# Patient Record
Sex: Male | Born: 1990 | Race: White | Hispanic: No | Marital: Single | Smoking: Current every day smoker
Health system: Southern US, Community
[De-identification: ages and names within clinical notes are randomized; demographics above are authoritative.]

## PROBLEM LIST (undated history)

## (undated) DIAGNOSIS — F209 Schizophrenia, unspecified: Secondary | ICD-10-CM

## (undated) DIAGNOSIS — I10 Essential (primary) hypertension: Secondary | ICD-10-CM

## (undated) DIAGNOSIS — E7800 Pure hypercholesterolemia, unspecified: Secondary | ICD-10-CM

## (undated) DIAGNOSIS — F319 Bipolar disorder, unspecified: Secondary | ICD-10-CM

## (undated) DIAGNOSIS — E039 Hypothyroidism, unspecified: Secondary | ICD-10-CM

## (undated) DIAGNOSIS — F32A Depression, unspecified: Secondary | ICD-10-CM

## (undated) DIAGNOSIS — F329 Major depressive disorder, single episode, unspecified: Secondary | ICD-10-CM

## (undated) HISTORY — PX: TONSILLECTOMY: SUR1361

## (undated) HISTORY — PX: CARDIAC SURGERY: SHX584

---
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
## 2012-02-18 ENCOUNTER — Inpatient Hospital Stay: Payer: Self-pay | Admitting: Psychiatry

## 2012-02-18 LAB — COMPREHENSIVE METABOLIC PANEL
Albumin: 3.9 g/dL (ref 3.8–5.6)
Anion Gap: 7 (ref 7–16)
Co2: 29 mmol/L (ref 21–32)
Creatinine: 0.73 mg/dL (ref 0.60–1.30)
EGFR (Non-African Amer.): >60
Glucose: 98 mg/dL (ref 65–99)
Osmolality: 280 (ref 275–301)
SGOT(AST): 30 U/L (ref 10–41)
SGPT (ALT): 57 U/L
Total Protein: 7.3 g/dL (ref 6.4–8.6)

## 2012-02-18 LAB — CBC
HCT: 47.6 % (ref 40.0–52.0)
MCH: 30.7 pg (ref 26.0–34.0)
MCV: 91 fL (ref 80–100)
RBC: 5.23 10*6/uL (ref 4.40–5.90)
RDW: 13.6 % (ref 11.5–14.5)
WBC: 11.1 10*3/uL — ABNORMAL HIGH (ref 3.8–10.6)

## 2012-02-18 LAB — ETHANOL
Ethanol %: <0.003 % (ref 0.000–0.080)
Ethanol: <3 mg/dL

## 2012-02-18 LAB — DRUG SCREEN, URINE
Amphetamines, Ur Screen: NEGATIVE (ref ?–1000)
Barbiturates, Ur Screen: NEGATIVE (ref ?–200)
Benzodiazepine, Ur Scrn: NEGATIVE (ref ?–200)
Cannabinoid 50 Ng, Ur ~~LOC~~: NEGATIVE (ref ?–50)
Methadone, Ur Screen: NEGATIVE (ref ?–300)
Opiate, Ur Screen: NEGATIVE (ref ?–300)
Phencyclidine (PCP) Ur S: NEGATIVE (ref ?–25)
Tricyclic, Ur Screen: NEGATIVE (ref ?–1000)

## 2012-02-18 LAB — URINALYSIS, COMPLETE
Bacteria: NONE SEEN
Glucose,UR: NEGATIVE mg/dL (ref 0–75)
Ketone: NEGATIVE
Leukocyte Esterase: NEGATIVE
Ph: 7 (ref 4.5–8.0)
Protein: NEGATIVE
RBC,UR: NONE SEEN /HPF (ref 0–5)
Specific Gravity: 1.006 (ref 1.003–1.030)
WBC UR: <1 /HPF (ref 0–5)

## 2012-02-18 LAB — TSH: Thyroid Stimulating Horm: 2.42 u[IU]/mL

## 2012-02-28 LAB — VALPROIC ACID LEVEL: Valproic Acid: 73 ug/mL

## 2012-05-28 ENCOUNTER — Emergency Department: Payer: Self-pay | Admitting: Emergency Medicine

## 2012-05-28 LAB — COMPREHENSIVE METABOLIC PANEL
Alkaline Phosphatase: 96 U/L — ABNORMAL LOW (ref 98–317)
Bilirubin,Total: 0.2 mg/dL (ref 0.2–1.0)
Calcium, Total: 9.1 mg/dL (ref 9.0–10.7)
Chloride: 105 mmol/L (ref 98–107)
Co2: 27 mmol/L (ref 21–32)
Creatinine: 0.7 mg/dL (ref 0.60–1.30)
EGFR (African American): >60
EGFR (Non-African Amer.): >60
Osmolality: 281 (ref 275–301)
Potassium: 3.8 mmol/L (ref 3.5–5.1)
Sodium: 141 mmol/L (ref 136–145)
Total Protein: 7.2 g/dL (ref 6.4–8.6)

## 2012-05-28 LAB — CBC
HCT: 46.6 % (ref 40.0–52.0)
HGB: 16.5 g/dL (ref 13.0–18.0)
MCH: 32.4 pg (ref 26.0–34.0)
MCHC: 35.3 g/dL (ref 32.0–36.0)
MCV: 92 fL (ref 80–100)
RBC: 5.09 10*6/uL (ref 4.40–5.90)
WBC: 11.5 10*3/uL — ABNORMAL HIGH (ref 3.8–10.6)

## 2012-05-28 LAB — MAGNESIUM: Magnesium: 1.9 mg/dL

## 2012-09-17 ENCOUNTER — Emergency Department: Payer: Self-pay | Admitting: Unknown Physician Specialty

## 2012-09-17 LAB — COMPREHENSIVE METABOLIC PANEL
Albumin: 3.6 g/dL — ABNORMAL LOW (ref 3.8–5.6)
Anion Gap: 9 (ref 7–16)
Calcium, Total: 8.7 mg/dL — ABNORMAL LOW (ref 9.0–10.7)
Chloride: 108 mmol/L — ABNORMAL HIGH (ref 98–107)
Creatinine: 0.91 mg/dL (ref 0.60–1.30)
EGFR (African American): >60
Glucose: 126 mg/dL — ABNORMAL HIGH (ref 65–99)
Osmolality: 282 (ref 275–301)
Potassium: 4 mmol/L (ref 3.5–5.1)
SGOT(AST): 29 U/L (ref 10–41)
SGPT (ALT): 52 U/L (ref 12–78)
Sodium: 141 mmol/L (ref 136–145)
Total Protein: 7 g/dL (ref 6.4–8.6)

## 2012-09-17 LAB — CBC
HCT: 44.6 % (ref 40.0–52.0)
HGB: 15.6 g/dL (ref 13.0–18.0)
MCH: 32.1 pg (ref 26.0–34.0)
MCHC: 34.9 g/dL (ref 32.0–36.0)
MCV: 92 fL (ref 80–100)
Platelet: 172 10*3/uL (ref 150–440)
RBC: 4.85 10*6/uL (ref 4.40–5.90)
RDW: 12.9 % (ref 11.5–14.5)
WBC: 9.2 10*3/uL (ref 3.8–10.6)

## 2012-09-18 LAB — URINALYSIS, COMPLETE
Bacteria: NONE SEEN
Bilirubin,UR: NEGATIVE
Blood: NEGATIVE
Glucose,UR: NEGATIVE mg/dL (ref 0–75)
Leukocyte Esterase: NEGATIVE
Protein: NEGATIVE
RBC,UR: NONE SEEN /HPF (ref 0–5)
Specific Gravity: 1.019 (ref 1.003–1.030)
Squamous Epithelial: NONE SEEN
WBC UR: <1 /HPF (ref 0–5)

## 2012-12-28 ENCOUNTER — Emergency Department (HOSPITAL_COMMUNITY)
Admission: EM | Admit: 2012-12-28 | Discharge: 2012-12-29 | Disposition: A | Discharge status: Other Acute Inpatient Hospital | Payer: Federal, State, Local not specified - PPO | Attending: Emergency Medicine | Admitting: Emergency Medicine

## 2012-12-28 ENCOUNTER — Encounter (HOSPITAL_COMMUNITY): Payer: Self-pay | Admitting: *Deleted

## 2012-12-28 DIAGNOSIS — Z79899 Other long term (current) drug therapy: Secondary | ICD-10-CM | POA: Insufficient documentation

## 2012-12-28 DIAGNOSIS — R440 Auditory hallucinations: Secondary | ICD-10-CM

## 2012-12-28 DIAGNOSIS — Z862 Personal history of diseases of the blood and blood-forming organs and certain disorders involving the immune mechanism: Secondary | ICD-10-CM | POA: Insufficient documentation

## 2012-12-28 DIAGNOSIS — F209 Schizophrenia, unspecified: Secondary | ICD-10-CM | POA: Insufficient documentation

## 2012-12-28 DIAGNOSIS — Z8639 Personal history of other endocrine, nutritional and metabolic disease: Secondary | ICD-10-CM | POA: Insufficient documentation

## 2012-12-28 DIAGNOSIS — Z7982 Long term (current) use of aspirin: Secondary | ICD-10-CM | POA: Insufficient documentation

## 2012-12-28 DIAGNOSIS — I10 Essential (primary) hypertension: Secondary | ICD-10-CM | POA: Insufficient documentation

## 2012-12-28 DIAGNOSIS — Z8679 Personal history of other diseases of the circulatory system: Secondary | ICD-10-CM | POA: Insufficient documentation

## 2012-12-28 HISTORY — DX: Essential (primary) hypertension: I10

## 2012-12-28 HISTORY — DX: Schizophrenia, unspecified: F20.9

## 2012-12-28 LAB — CBC WITH DIFFERENTIAL/PLATELET
Basophils Absolute: 0 10*3/uL (ref 0.0–0.1)
Eosinophils Absolute: 0.3 10*3/uL (ref 0.0–0.7)
Eosinophils Relative: 2 % (ref 0–5)
HCT: 49.8 % (ref 39.0–52.0)
Lymphocytes Relative: 13 % (ref 12–46)
MCH: 32.1 pg (ref 26.0–34.0)
MCHC: 35.3 g/dL (ref 30.0–36.0)
MCV: 90.7 fL (ref 78.0–100.0)
Monocytes Absolute: 1 10*3/uL (ref 0.1–1.0)
RDW: 12.8 % (ref 11.5–15.5)
WBC: 15.7 10*3/uL — ABNORMAL HIGH (ref 4.0–10.5)

## 2012-12-28 LAB — URINALYSIS, ROUTINE W REFLEX MICROSCOPIC
Glucose, UA: NEGATIVE mg/dL
Leukocytes, UA: NEGATIVE
Nitrite: NEGATIVE
Specific Gravity, Urine: >1.03 — ABNORMAL HIGH (ref 1.005–1.030)
pH: 5.5 (ref 5.0–8.0)

## 2012-12-28 LAB — RAPID URINE DRUG SCREEN, HOSP PERFORMED
Benzodiazepines: POSITIVE — AB
Cocaine: NOT DETECTED
Opiates: NOT DETECTED

## 2012-12-28 LAB — BASIC METABOLIC PANEL
Calcium: 10.2 mg/dL (ref 8.4–10.5)
Creatinine, Ser: 1.04 mg/dL (ref 0.50–1.35)
GFR calc non Af Amer: >90 mL/min (ref >90)
Glucose, Bld: 96 mg/dL (ref 70–99)
Sodium: 137 mEq/L (ref 135–145)

## 2012-12-28 LAB — ETHANOL: Alcohol, Ethyl (B): <11 mg/dL (ref 0–11)

## 2012-12-28 LAB — URINE MICROSCOPIC-ADD ON

## 2012-12-28 MED ORDER — LORAZEPAM 1 MG PO TABS
1.0000 mg | ORAL_TABLET | Freq: Once | ORAL | Status: AC
  Administered 2012-12-28: 1 mg via ORAL

## 2012-12-28 MED ORDER — LORAZEPAM 1 MG PO TABS
ORAL_TABLET | ORAL | Status: AC
  Filled 2012-12-28: qty 1

## 2012-12-28 NOTE — ED Provider Notes (Signed)
History     CSN: [id number]  Arrival date & time 12/28/12  1719   First MD Initiated Contact with Patient 12/28/12 1753      Chief Complaint  Patient presents with  . V70.1     HPI Pt was seen at 1810.   Per pt, c/o gradual onset and persistence of constant auditory hallucinations for the past several weeks, worse over the past several days.  Pt states the voices "sometimes laugh" and "sometimes tell me to hurt myself."  Currently denies SI, no HI.  Endorses he has been taking his meds as prescribed but does not currently have an outpatient mental health provider.      Past Medical History  Diagnosis Date  . Schizophrenia   . Hypertension   . High cholesterol   . Atrial fibrillation     History reviewed. No pertinent past surgical history.    History  Substance Use Topics  . Smoking status: Never Smoker   . Smokeless tobacco: Not on file  . Alcohol Use: No    Review of Systems ROS: Statement: All systems negative except as marked or noted in the HPI; Constitutional: Negative for fever and chills. ; ; Eyes: Negative for eye pain, redness and discharge. ; ; ENMT: Negative for ear pain, hoarseness, nasal congestion, sinus pressure and sore throat. ; ; Cardiovascular: Negative for chest pain, palpitations, diaphoresis, dyspnea and peripheral edema. ; ; Respiratory: Negative for cough, wheezing and stridor. ; ; Gastrointestinal: Negative for nausea, vomiting, diarrhea, abdominal pain, blood in stool, hematemesis, jaundice and rectal bleeding. . ; ; Genitourinary: Negative for dysuria, flank pain and hematuria. ; ; Musculoskeletal: Negative for back pain and neck pain. Negative for swelling and trauma.; ; Skin: Negative for pruritus, rash, abrasions, blisters, bruising and skin lesion.; ; Neuro: Negative for headache, lightheadedness and neck stiffness. Negative for weakness, altered level of consciousness , altered mental status, extremity weakness, paresthesias, involuntary  movement, seizure and syncope.; Psych:  No SI, no SA, no HI, +auditory hallucinations.       Allergies  Review of patient's allergies indicates no known allergies.  Home Medications   Current Outpatient Rx  Name  Route  Sig  Dispense  Refill  . aspirin EC 81 MG tablet   Oral   Take 81 mg by mouth daily.         . divalproex (DEPAKOTE) 250 MG DR tablet   Oral   Take 250 mg by mouth 2 (two) times daily.         Marland Kitchen FLUoxetine (PROZAC) 20 MG tablet   Oral   Take 20 mg by mouth daily.         . risperiDONE (RISPERDAL) 0.5 MG tablet   Oral   Take 0.5 mg by mouth 2 (two) times daily.           BP 148/99  Pulse 98  Temp(Src) 98.4 F (36.9 C) (Oral)  Resp 20  Ht 5\' 10"  (1.778 m)  Wt 283 lb (128.368 kg)  BMI 40.61 kg/m2  SpO2 99%  Physical Exam 1815: Physical examination:  Nursing notes reviewed; Vital signs and O2 SAT reviewed;  Constitutional: Well developed, Well nourished, Well hydrated, In no acute distress; Head:  Normocephalic, atraumatic; Eyes: EOMI, PERRL, No scleral icterus; ENMT: Mouth and pharynx normal, Mucous membranes moist; Neck: Supple, Full range of motion, No lymphadenopathy; Cardiovascular: Regular rate and rhythm, No murmur, rub, or gallop; Respiratory: Breath sounds clear & equal bilaterally, No rales, rhonchi, wheezes.  Speaking full sentences with ease, Normal respiratory effort/excursion; Chest: Nontender, Movement normal; Abdomen: Soft, Nontender, Nondistended, Normal bowel sounds; Genitourinary: No CVA tenderness; Extremities: Pulses normal, No tenderness, No edema, No calf edema or asymmetry.; Neuro: AA&Ox3, Major CN grossly intact.  Speech clear. No gross focal motor or sensory deficits in extremities.; Skin: Color normal, Warm, Dry.; Psych:  Affect flat, poor eye contact.    ED Course  Procedures    MDM  MDM Reviewed: previous chart, nursing note and vitals Interpretation: labs   Results for orders placed during the hospital  encounter of 12/28/12  URINALYSIS, ROUTINE W REFLEX MICROSCOPIC      Result Value Range   Color, Urine AMBER (*) YELLOW   APPearance CLEAR  CLEAR   Specific Gravity, Urine >1.030 (*) 1.005 - 1.030   pH 5.5  5.0 - 8.0   Glucose, UA NEGATIVE  NEGATIVE mg/dL   Hgb urine dipstick NEGATIVE  NEGATIVE   Bilirubin Urine SMALL (*) NEGATIVE   Ketones, ur NEGATIVE  NEGATIVE mg/dL   Protein, ur TRACE (*) NEGATIVE mg/dL   Urobilinogen, UA 0.2  0.0 - 1.0 mg/dL   Nitrite NEGATIVE  NEGATIVE   Leukocytes, UA NEGATIVE  NEGATIVE  URINE RAPID DRUG SCREEN (HOSP PERFORMED)      Result Value Range   Opiates NONE DETECTED  NONE DETECTED   Cocaine NONE DETECTED  NONE DETECTED   Benzodiazepines POSITIVE (*) NONE DETECTED   Amphetamines NONE DETECTED  NONE DETECTED   Tetrahydrocannabinol POSITIVE (*) NONE DETECTED   Barbiturates NONE DETECTED  NONE DETECTED  CBC WITH DIFFERENTIAL      Result Value Range   WBC 15.7 (*) 4.0 - 10.5 K/uL   RBC 5.49  4.22 - 5.81 MIL/uL   Hemoglobin 17.6 (*) 13.0 - 17.0 g/dL   HCT 16.1  09.6 - 04.5 %   MCV 90.7  78.0 - 100.0 fL   MCH 32.1  26.0 - 34.0 pg   MCHC 35.3  30.0 - 36.0 g/dL   RDW 40.9  81.1 - 91.4 %   Platelets 190  150 - 400 K/uL   Neutrophils Relative 79 (*) 43 - 77 %   Neutro Abs 12.3 (*) 1.7 - 7.7 K/uL   Lymphocytes Relative 13  12 - 46 %   Lymphs Abs 2.0  0.7 - 4.0 K/uL   Monocytes Relative 7  3 - 12 %   Monocytes Absolute 1.0  0.1 - 1.0 K/uL   Eosinophils Relative 2  0 - 5 %   Eosinophils Absolute 0.3  0.0 - 0.7 K/uL   Basophils Relative 0  0 - 1 %   Basophils Absolute 0.0  0.0 - 0.1 K/uL  BASIC METABOLIC PANEL      Result Value Range   Sodium 137  135 - 145 mEq/L   Potassium 4.0  3.5 - 5.1 mEq/L   Chloride 100  96 - 112 mEq/L   CO2 25  19 - 32 mEq/L   Glucose, Bld 96  70 - 99 mg/dL   BUN 14  6 - 23 mg/dL   Creatinine, Ser 7.82  0.50 - 1.35 mg/dL   Calcium 95.6  8.4 - 21.3 mg/dL   GFR calc non Af Amer >90  >90 mL/min   GFR calc Af Amer >90  >90  mL/min  ETHANOL      Result Value Range   Alcohol, Ethyl (B) <11  0 - 11 mg/dL  URINE MICROSCOPIC-ADD ON      Result  Value Range   WBC, UA 0-2  <3 WBC/hpf   RBC / HPF 0-2  <3 RBC/hpf   Bacteria, UA RARE  RARE   Urine-Other MUCOUS PRESENT       2355:  ACT has eval: IVC paperwork completed due to pt making his head on the wall stating he first "wanted to go home" the wanted to "get tased."  Pt handcuffed to bed for pt and staff safety. Pt accepted to [facility], will transfer stable.           Laray Anger, DO 12/29/12 0011
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
## 2012-12-28 NOTE — BH Assessment (Signed)
[facility] Assessment Progress Note      12/28/2012 Patient was accepted by Elmon Kirschner, PA, to Dr. Jannifer Franklin; room 400-bed 1. Patient has been placed under IVC; he was wanting to leave after his father came in to the room to see him. He has been calm, but it was felt that IVC would be most appropriate. UR sheet completed; could not pre-cert insurance, must call on Monday.  Val Farnam H. Jacqulyn Ducking, [name], [name], LCASA, CSW-G

## 2012-12-28 NOTE — ED Notes (Signed)
Patient reports hearing voices that are telling him to kill himself. Patient reports hearing voices everyday that "come and go". Patient states he is not having thoughts of hurting himself or other, though the voices tell him to hurt himself. Patient with poor focus, unable to maintain eye contact more than a few seconds. Patient cooperative. Belongings secured. Meal given.

## 2012-12-28 NOTE — ED Notes (Signed)
Reports hearing voices - hx of schizophrenia; states is med compliant; denies SI/HI, but states voices are telling him to hurt himself.
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
## 2012-12-28 NOTE — ED Notes (Signed)
Pt steadily banging head against the wall. I asked pt to stop and he would not. He said, "I want to go home." I told pt beating his head on the wall wasn't helping his case he then turned around to me and said "I don't care, I want to get tased." Richard with security asked pt to stop as well he turned around and bucked up on him saying, "What are you gonna do, tase me."I want to be tased." [employer] called but the had no one available Richard got pt back in the bed and the tech then said pt was hearing things. Pt now is refusing EKG.

## 2012-12-28 NOTE — BHH Counselor (Signed)
Shon Baton, ACT counselor at [facility], submitted Pt for admission to [facility]. Laverle Hobby, [facility] confirmed bed availability. Jorje Guild, PA reviewed clinical information and accepted Pt to the service of Dr. Cyndia Diver, room 400-1.  Harlin Rain Patsy Baltimore, LPC, [facility] Assessment Counselor

## 2012-12-29 ENCOUNTER — Inpatient Hospital Stay (HOSPITAL_COMMUNITY)
Admission: AD | Admit: 2012-12-29 | Discharge: 2013-01-04 | DRG: 430 | Disposition: A | Discharge status: Home / Self Care | Payer: Federal, State, Local not specified - PPO | Source: Intra-hospital | Attending: Psychiatry | Admitting: Psychiatry

## 2012-12-29 ENCOUNTER — Encounter (HOSPITAL_COMMUNITY): Payer: Self-pay

## 2012-12-29 DIAGNOSIS — I471 Supraventricular tachycardia, unspecified: Secondary | ICD-10-CM | POA: Diagnosis present

## 2012-12-29 DIAGNOSIS — Z79899 Other long term (current) drug therapy: Secondary | ICD-10-CM

## 2012-12-29 DIAGNOSIS — F250 Schizoaffective disorder, bipolar type: Secondary | ICD-10-CM | POA: Diagnosis present

## 2012-12-29 DIAGNOSIS — I10 Essential (primary) hypertension: Secondary | ICD-10-CM | POA: Diagnosis present

## 2012-12-29 DIAGNOSIS — I4891 Unspecified atrial fibrillation: Secondary | ICD-10-CM | POA: Diagnosis present

## 2012-12-29 DIAGNOSIS — F209 Schizophrenia, unspecified: Principal | ICD-10-CM | POA: Diagnosis present

## 2012-12-29 DIAGNOSIS — F1210 Cannabis abuse, uncomplicated: Secondary | ICD-10-CM | POA: Diagnosis present

## 2012-12-29 MED ORDER — ASPIRIN 81 MG PO CHEW
81.0000 mg | CHEWABLE_TABLET | Freq: Once | ORAL | Status: AC
  Administered 2012-12-29: 81 mg via ORAL
  Filled 2012-12-29 (×2): qty 1

## 2012-12-29 MED ORDER — DIVALPROEX SODIUM 250 MG PO DR TAB
250.0000 mg | DELAYED_RELEASE_TABLET | Freq: Two times a day (BID) | ORAL | Status: DC
  Administered 2012-12-29 – 2012-12-31 (×5): 250 mg via ORAL
  Filled 2012-12-29 (×7): qty 1

## 2012-12-29 MED ORDER — RISPERIDONE 0.5 MG PO TABS
0.5000 mg | ORAL_TABLET | Freq: Two times a day (BID) | ORAL | Status: DC
  Administered 2012-12-29: 0.5 mg via ORAL
  Filled 2012-12-29 (×5): qty 1

## 2012-12-29 MED ORDER — RISPERIDONE 1 MG PO TABS
1.0000 mg | ORAL_TABLET | Freq: Two times a day (BID) | ORAL | Status: DC
  Administered 2012-12-29 – 2012-12-31 (×4): 1 mg via ORAL
  Filled 2012-12-29 (×6): qty 1

## 2012-12-29 MED ORDER — BENZTROPINE MESYLATE 1 MG PO TABS
1.0000 mg | ORAL_TABLET | Freq: Every day | ORAL | Status: DC
  Administered 2012-12-29 – 2013-01-04 (×7): 1 mg via ORAL
  Filled 2012-12-29 (×4): qty 1
  Filled 2012-12-29: qty 3
  Filled 2012-12-29 (×5): qty 1

## 2012-12-29 MED ORDER — FLUOXETINE HCL 20 MG PO CAPS
20.0000 mg | ORAL_CAPSULE | Freq: Every day | ORAL | Status: DC
  Administered 2012-12-29 – 2013-01-04 (×7): 20 mg via ORAL
  Filled 2012-12-29: qty 3
  Filled 2012-12-29 (×8): qty 1
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
## 2012-12-29 NOTE — Progress Notes (Signed)
Patient ID: [name], male   DOB: June 23, 1991, 22 y.o.   MRN: [id number] 12-29-12 @ 1537 nursing shift note: d: pt has been very guarded and not visible in the milieu. He took his am medications. A: staff has encouraged this patient to participate in the milieu, such as coming to groups. On his inventory sheet he wrote slept fair, appetite good, energy low, attention good. His depression he scored at 4 and hopelessness at 6. He denied any suicidal thoughts.   R: he has not attended groups and remains in his room, sleeping most of the day. Staff will continue to encourage and support.

## 2012-12-29 NOTE — Progress Notes (Signed)
Patient ID: [name], male   DOB: 1991/05/15, 22 y.o.   MRN: [id number] Psychoeducational Group Note  Date:  12/29/2012 Time:1000am  Group Topic/Focus:  Identifying Needs:   The focus of this group is to help patients identify their personal needs that have been historically problematic and identify healthy behaviors to address their needs.  Participation Level:  Did Not Attend  Participation Quality:    Affect:    Cognitive:   Insight:   Engagement in Group: Additional Comments:  Inventory group and healthy coping skills   Valente David 12/29/2012,10:18 AM

## 2012-12-29 NOTE — Tx Team (Signed)
Initial Interdisciplinary Treatment Plan  PATIENT STRENGTHS: (choose at least two) Active sense of humor General fund of knowledge  PATIENT STRESSORS: Financial difficulties Medication change or noncompliance   PROBLEM LIST: Problem List/Patient Goals Date to be addressed Date deferred Reason deferred Estimated date of resolution  Risk for Suicide 12/29/12     Depression 12/29/12     Anger control 12/29/12     Psychosis 12/29/12                                    DISCHARGE CRITERIA:  Improved stabilization in mood, thinking, and/or behavior Verbal commitment to aftercare and medication compliance  PRELIMINARY DISCHARGE PLAN: Attend aftercare/continuing care group Outpatient therapy  PATIENT/FAMIILY INVOLVEMENT: This treatment plan has been presented to and reviewed with the patient, [name].  The patient and family have been given the opportunity to ask questions and make suggestions.  [name] 12/29/2012, 5:30 AM

## 2012-12-29 NOTE — Progress Notes (Signed)
Met with pt 1:1 who is resting in bed where he has remained for this evening. He states he has attended groups today but chart indicates otherwise. He refused evening wrap up. He is guarded and anxious. Denies AVH, SI/HI but eye contact is brief to fair when answering questions. Denies pain and problems. No meds ordered this evening. Level III obs continues for safety. Support offered. Pt remains safe, asleep at this time. [name]

## 2012-12-29 NOTE — BHH Group Notes (Signed)
[facility] [name] Group Therapy  12/29/2012 12:00 PM  Did not attend  Sarina Ser 12/29/2012, 12:00 PM
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
## 2012-12-29 NOTE — Progress Notes (Signed)
Patient ID: [name], male   DOB: 1991/07/28, 22 y.o.   MRN: [id number]  Admission Note:  D:22 yr male who presents IVC in no acute distress for the treatment of SI and Depression. Pt appears flat and depressed. Pt was calm and cooperative with admission process. Pt presents with passive SI and contracts for safety upon admission. Pt denies AVH at this time. Pt states he has a hx of SI /AVH since he was a  child. Pt states he was cutting the grass, started feeling bad (SI/AH), he went to lay down and his father asked him if he wanted to go to the hospital, and he said yes. When pt got to the hospital he was over his feelings of SI and not having AVH, and he wanted to go home and they would not let him. Pt began banging head on wall and the situation got out of hand. P    A:Skin was assessed and found to be clear of any abnormal marks apart from a scar on abdomen and superficial marks on R-lower inner arm per the nurse who did the skin search . POC and unit policies explained and understanding verbalized. Consents obtained. Food and fluids offered, and fluids accepted.  R: Pt had no additional questions or concerns at this time.

## 2012-12-30 DIAGNOSIS — F209 Schizophrenia, unspecified: Principal | ICD-10-CM

## 2012-12-30 LAB — VALPROIC ACID LEVEL: Valproic Acid Lvl: 45.3 ug/mL — ABNORMAL LOW (ref 50.0–100.0)

## 2012-12-30 MED ORDER — RISPERIDONE 1 MG PO TABS
ORAL_TABLET | ORAL | Status: AC
  Administered 2012-12-30: 20:00:00
  Filled 2012-12-30: qty 1

## 2012-12-30 MED ORDER — LORAZEPAM 2 MG/ML IJ SOLN
INTRAMUSCULAR | Status: AC
  Administered 2012-12-30: 2 mg via INTRAMUSCULAR
  Filled 2012-12-30: qty 1

## 2012-12-30 MED ORDER — NICOTINE 21 MG/24HR TD PT24
21.0000 mg | MEDICATED_PATCH | Freq: Once | TRANSDERMAL | Status: AC
  Administered 2012-12-30: 21 mg via TRANSDERMAL
  Filled 2012-12-30 (×2): qty 1

## 2012-12-30 MED ORDER — HALOPERIDOL 5 MG PO TABS
5.0000 mg | ORAL_TABLET | Freq: Once | ORAL | Status: DC
  Filled 2012-12-30: qty 1

## 2012-12-30 MED ORDER — LORAZEPAM 1 MG PO TABS: 2.0000 mg | ORAL_TABLET | Freq: Once | ORAL | Status: AC

## 2012-12-30 MED ORDER — LORATADINE 10 MG PO TABS
10.0000 mg | ORAL_TABLET | Freq: Every day | ORAL | Status: DC
  Administered 2012-12-30 – 2013-01-04 (×6): 10 mg via ORAL
  Filled 2012-12-30 (×9): qty 1

## 2012-12-30 MED ORDER — NICOTINE 21 MG/24HR TD PT24
21.0000 mg | MEDICATED_PATCH | Freq: Every day | TRANSDERMAL | Status: DC
  Administered 2012-12-31: 21 mg via TRANSDERMAL
  Filled 2012-12-30 (×4): qty 1

## 2012-12-30 MED ORDER — RISPERIDONE 1 MG PO TABS
1.0000 mg | ORAL_TABLET | Freq: Once | ORAL | Status: AC
  Filled 2012-12-30: qty 1

## 2012-12-30 MED ORDER — HALOPERIDOL LACTATE 5 MG/ML IJ SOLN
INTRAMUSCULAR | Status: AC
  Administered 2012-12-30: 5 mg via INTRAMUSCULAR
  Filled 2012-12-30: qty 1

## 2012-12-30 MED ORDER — LORAZEPAM 2 MG/ML IJ SOLN: 2.0000 mg | Freq: Once | INTRAMUSCULAR | Status: AC

## 2012-12-30 MED ORDER — DIPHENHYDRAMINE HCL 50 MG PO CAPS
50.0000 mg | ORAL_CAPSULE | Freq: Once | ORAL | Status: AC
  Administered 2012-12-30: 50 mg via ORAL
  Filled 2012-12-30: qty 1

## 2012-12-30 NOTE — Progress Notes (Signed)
Patient ID: [name], male   DOB: 09-27-1990, 22 y.o.   MRN: [id number] Psychoeducational Group Note  Date:  12/30/2012 Time:1000am  Group Topic/Focus:  Identifying Needs:   The focus of this group is to help patients identify their personal needs that have been historically problematic and identify healthy behaviors to address their needs.  Participation Level:  Did Not Attend  Participation Quality:    Affect:    Cognitive: Insight:   Engagement in Group:  Additional Comments: inventory group and healthy support systems.   Valente David 12/30/2012,10:19 AM

## 2012-12-30 NOTE — Progress Notes (Signed)
Psychoeducational Group Note  Date:  12/30/2012 Time:  1100   Group Topic/Focus:  Spirituality:   The focus of this group is to discuss how one's spirituality can aide in recovery.  Participation Level: Did Not Attend  Participation Quality:  Not Applicable  Affect:  Not Applicable  Cognitive:  Not Applicable  Insight:  Not Applicable  Engagement in Group: Not Applicable  Additional Comments:  Patient encouraged to attend group by RN, patient Did not attend.  Noah Charon 12/30/2012, 11:03 AM

## 2012-12-30 NOTE — [facility] Group Notes (Signed)
[facility] [name] Group Therapy  12/30/2012 11:15am-12:00pm  Summary of Progress/Problems:  The main focus of today's therapy group was to listen to various genres of music and to identify that different types of music provoke different responses.  The patient then was able to identify personally what was soothing for them, as well as energizing.  Handouts were used to record feelings evoked, as well as how patient can personally use this knowledge in sleep habits, with depression, and with other symptoms.    Type of Therapy:  Group Therapy  Participation Level:  Active  Participation Quality:  Appropriate, Attentive and Sharing  Affect:  Blunted  Cognitive:  Appropriate  Insight:  Engaged  Engagement in Therapy:  Engaged  Modes of Intervention:  Activity and Exploration  Grossman-Orr, Mariona Scholes Jo 12/30/2012, 12:56 PM  

## 2012-12-30 NOTE — Progress Notes (Signed)
Patient ID: [name], male   DOB: [birth year], 22 y.o.   MRN: [id number] Nursing shift note: D: this patient has been unwilling to participate most of the day. He has had no complaints. A: staff continue to encourage and support this patients. R: he has refused his v/s. He has refused to come to groups, but is taking his medications at the medication window. He is not visible in the milieu. He does deny any si/hi. RN will monitor and Q 15 min ck's continue.

## 2012-12-30 NOTE — Progress Notes (Signed)
[facility] MD Progress Note  12/30/2012 2:38 PM [name]  MRN:  [id number] Subjective:  "Im depressed cause I'm here." Objective: Patient resting in his room voices no new complaints, does not appear depressed and smiles easily. Diagnosis:  Schizophrenia   ADL's:  Intact  Sleep: Poor  Trouble initiating and maintaining sleep per patient.   Appetite:  Good  Suicidal Ideation:  denies Homicidal Ideation:  denies AEB (as evidenced by): patient's reports of decreasing symptoms.  Psychiatric Specialty Exam: Review of Systems  Constitutional: Negative.  Negative for fever, chills, weight loss, malaise/fatigue and diaphoresis.  HENT: Negative for congestion and sore throat.   Eyes: Negative for blurred vision, double vision and photophobia.  Respiratory: Negative for cough, shortness of breath and wheezing.   Cardiovascular: Negative for chest pain, palpitations and PND.  Gastrointestinal: Negative for heartburn, nausea, vomiting, abdominal pain, diarrhea and constipation.  Musculoskeletal: Negative for myalgias, joint pain and falls.  Neurological: Negative for dizziness, tingling, tremors, sensory change, speech change, focal weakness, seizures, loss of consciousness, weakness and headaches.  Endo/Heme/Allergies: Negative for polydipsia. Does not bruise/bleed easily.  Psychiatric/Behavioral: Negative for depression, suicidal ideas, hallucinations, memory loss and substance abuse. The patient is not nervous/anxious and does not have insomnia.     Blood pressure 129/89, pulse 101, temperature 97.7 F (36.5 C), temperature source Oral, resp. rate 18, height 5\' 7"  (1.702 m), weight 122.471 kg (270 lb).Body mass index is 42.28 kg/(m^2).  General Appearance: Casual  Eye Contact::  Good  Speech:  Clear and Coherent  Volume:  Normal  Mood:  Euthymic  Affect:  Congruent  Thought Process:  Goal Directed  Orientation:  Full (Time, Place, and Person)  Thought Content:  Hallucinations:  Auditory decreaing  Suicidal Thoughts:  No  Homicidal Thoughts:  No  Memory:  Immediate;   Fair  Judgement:  Impaired  Insight:  Lacking  Psychomotor Activity:  Normal  Concentration:  Fair  Recall:  Fair  Akathisia:  No  Handed:  Right  AIMS (if indicated):     Assets:  Communication Skills Physical Health  Sleep:  Number of Hours: 6.75   Current Medications: Current Facility-Administered Medications  Medication Dose Route Frequency Provider Last Rate Last Dose  . benztropine (COGENTIN) tablet 1 mg  1 mg Oral Daily Verne Spurr, PA-C   1 mg at 12/30/12 0837  . divalproex (DEPAKOTE) DR tablet 250 mg  250 mg Oral BID Jorje Guild, PA-C   250 mg at 12/30/12 4098  . FLUoxetine (PROZAC) capsule 20 mg  20 mg Oral Daily Jorje Guild, PA-C   20 mg at 12/30/12 1191  . risperiDONE (RISPERDAL) tablet 1 mg  1 mg Oral BID Verne Spurr, PA-C   1 mg at 12/30/12 0840    Lab Results:  Results for orders placed during the hospital encounter of 12/28/12 (from the past 48 hour(s))  CBC WITH DIFFERENTIAL     Status: Abnormal   Collection Time    12/28/12  5:54 PM      Result Value Range   WBC 15.7 (*) 4.0 - 10.5 K/uL   RBC 5.49  4.22 - 5.81 MIL/uL   Hemoglobin 17.6 (*) 13.0 - 17.0 g/dL   HCT 47.8  29.5 - 62.1 %   MCV 90.7  78.0 - 100.0 fL   MCH 32.1  26.0 - 34.0 pg   MCHC 35.3  30.0 - 36.0 g/dL   RDW 30.8  65.7 - 84.6 %   Platelets 190  150 - 400  K/uL   Neutrophils Relative 79 (*) 43 - 77 %   Neutro Abs 12.3 (*) 1.7 - 7.7 K/uL   Lymphocytes Relative 13  12 - 46 %   Lymphs Abs 2.0  0.7 - 4.0 K/uL   Monocytes Relative 7  3 - 12 %   Monocytes Absolute 1.0  0.1 - 1.0 K/uL   Eosinophils Relative 2  0 - 5 %   Eosinophils Absolute 0.3  0.0 - 0.7 K/uL   Basophils Relative 0  0 - 1 %   Basophils Absolute 0.0  0.0 - 0.1 K/uL  BASIC METABOLIC PANEL     Status: None   Collection Time    12/28/12  5:54 PM      Result Value Range   Sodium 137  135 - 145 mEq/L   Potassium 4.0  3.5 - 5.1 mEq/L    Chloride 100  96 - 112 mEq/L   CO2 25  19 - 32 mEq/L   Glucose, Bld 96  70 - 99 mg/dL   BUN 14  6 - 23 mg/dL   Creatinine, Ser 1.61  0.50 - 1.35 mg/dL   Calcium 09.6  8.4 - 04.5 mg/dL   GFR calc non Af Amer >90  >90 mL/min   GFR calc Af Amer >90  >90 mL/min   Comment:            The eGFR has been calculated     using the CKD EPI equation.     This calculation has not been     validated in all clinical     situations.     eGFR's persistently     <90 mL/min signify     possible Chronic Kidney Disease.  ETHANOL     Status: None   Collection Time    12/28/12  5:54 PM      Result Value Range   Alcohol, Ethyl (B) <11  0 - 11 mg/dL   Comment:            LOWEST DETECTABLE LIMIT FOR     SERUM ALCOHOL IS 11 mg/dL     FOR MEDICAL PURPOSES ONLY  URINALYSIS, ROUTINE W REFLEX MICROSCOPIC     Status: Abnormal   Collection Time    12/28/12  8:41 PM      Result Value Range   Color, Urine AMBER (*) YELLOW   Comment: BIOCHEMICALS MAY BE AFFECTED BY COLOR   APPearance CLEAR  CLEAR   Specific Gravity, Urine >1.030 (*) 1.005 - 1.030   pH 5.5  5.0 - 8.0   Glucose, UA NEGATIVE  NEGATIVE mg/dL   Hgb urine dipstick NEGATIVE  NEGATIVE   Bilirubin Urine SMALL (*) NEGATIVE   Ketones, ur NEGATIVE  NEGATIVE mg/dL   Protein, ur TRACE (*) NEGATIVE mg/dL   Urobilinogen, UA 0.2  0.0 - 1.0 mg/dL   Nitrite NEGATIVE  NEGATIVE   Leukocytes, UA NEGATIVE  NEGATIVE  URINE RAPID DRUG SCREEN (HOSP PERFORMED)     Status: Abnormal   Collection Time    12/28/12  8:41 PM      Result Value Range   Opiates NONE DETECTED  NONE DETECTED   Cocaine NONE DETECTED  NONE DETECTED   Benzodiazepines POSITIVE (*) NONE DETECTED   Amphetamines NONE DETECTED  NONE DETECTED   Tetrahydrocannabinol POSITIVE (*) NONE DETECTED   Barbiturates NONE DETECTED  NONE DETECTED   Comment:            DRUG SCREEN FOR MEDICAL PURPOSES  ONLY.  IF CONFIRMATION IS NEEDED     FOR ANY PURPOSE, NOTIFY LAB     WITHIN 5 DAYS.                 LOWEST DETECTABLE LIMITS     FOR URINE DRUG SCREEN     Drug Class       Cutoff (ng/mL)     Amphetamine      1000     Barbiturate      200     Benzodiazepine   200     Tricyclics       300     Opiates          300     Cocaine          300     THC              50  URINE MICROSCOPIC-ADD ON     Status: None   Collection Time    12/28/12  8:41 PM      Result Value Range   WBC, UA 0-2  <3 WBC/hpf   RBC / HPF 0-2  <3 RBC/hpf   Bacteria, UA RARE  RARE   Urine-Other MUCOUS PRESENT      Physical Findings: AIMS: Facial and Oral Movements Muscles of Facial Expression: None, normal Lips and Perioral Area: None, normal Jaw: None, normal Tongue: None, normal,Extremity Movements Upper (arms, wrists, hands, fingers): None, normal Lower (legs, knees, ankles, toes): None, normal, Trunk Movements Neck, shoulders, hips: None, normal, Overall Severity Severity of abnormal movements (highest score from questions above): None, normal Incapacitation due to abnormal movements: None, normal Patient's awareness of abnormal movements (rate only patient's report): No Awareness, Dental Status Current problems with teeth and/or dentures?: No Does patient usually wear dentures?: No  CIWA:  CIWA-Ar Total: 0 COWS:     Treatment Plan Summary: Daily contact with patient to assess and evaluate symptoms and progress in treatment Medication management  Plan: 1. Continue crisis management and stabilization. 2. Medication management to reduce current symptoms to base line and improve patient's overall level of functioning 3. Treat health problems as indicated. 4. Develop treatment plan to decrease risk of relapse upon discharge and the need for     readmission. 5. Psycho-social education regarding relapse prevention and self care. 6. Health care follow up as needed for medical problems. 7. Continue home medications where appropriate. 8. Patient is encouraged to attend groups. 9. ELOS: 2-3 days  Medical  Decision Making Problem Points:  Established problem, stable/improving (1) Data Points:  Review or order medicine tests (1) Review and summation of old records (2)  I certify that inpatient services furnished can reasonably be expected to improve the patient's condition.  Rona Ravens. Dagen Beevers RPAC 2:46 PM 12/30/2012

## 2012-12-30 NOTE — Progress Notes (Signed)
At approximately 1920 pt was in dayroom yelling and cursing at self. Observed fists clenched, pt grabbing head, shaking. Pt stated, "fuck you, leave me alone" (directed at [facility] which he said was a demon) but would also at times begin laughing in response to voice. At that time pt contracted verbally to not act out on this agitation however he also would not return to his room for a less stimulating environment. PA paged and order received to admin benadryl 50mg  po and risperdal 1mg  po. Pt took without difficulty. He continued to pace, yell and curse at self. He did consent to depakote level draw and was able to calmly converse with this writer at times which suggests some of this behavior to be attention seeking. He spoke of having these episodes which generally last in duration about 90 mins. He states this is why his step mom wanted him to come here. "She gets scared though I've never harmed anyone. If I'm going to do anything it's going to be at myself." At approximately 2015 pt heard punching wall in room at which time he was verbally deescalated. No roommate order was obtained for safety. Pt continued to state, "I wish I'd died in the womb. I'm so depressed. I hate my brain. I just wish I could get high. I wouldn't be here if my friend wouldn't have been late with my delivery (of [facility])." Encouraged pt to develop other coping skills and pt was provided with a journal and pencil as he states writing poetry is calming for him. Pt did partake in this activity for a short time but escalated again around 2320 with head banging against wall (though he denied). Pt again shaking, angry with clenched fists. Order obtained to admin haldol 5mg  IM along with ativan 2mg  IM which pt willingly took. "I just want something to work. I've been miserable for 3-1/2 years. No one can help me." At this time (2400) pt is lying down and calmer. Will continue to monitor closely. Lawrence Marseilles

## 2012-12-31 MED ORDER — DIVALPROEX SODIUM 500 MG PO DR TAB
500.0000 mg | DELAYED_RELEASE_TABLET | Freq: Two times a day (BID) | ORAL | Status: DC
  Administered 2012-12-31 – 2013-01-04 (×8): 500 mg via ORAL
  Filled 2012-12-31: qty 1
  Filled 2012-12-31 (×2): qty 6
  Filled 2012-12-31 (×9): qty 1

## 2012-12-31 MED ORDER — LORAZEPAM 2 MG/ML IJ SOLN
INTRAMUSCULAR | Status: AC
  Filled 2012-12-31: qty 1

## 2012-12-31 MED ORDER — TRAZODONE HCL 100 MG PO TABS
100.0000 mg | ORAL_TABLET | Freq: Every evening | ORAL | Status: DC | PRN
  Administered 2013-01-01 – 2013-01-03 (×3): 100 mg via ORAL
  Filled 2012-12-31 (×3): qty 1
  Filled 2012-12-31: qty 3

## 2012-12-31 MED ORDER — LORAZEPAM 1 MG PO TABS: 2.0000 mg | ORAL_TABLET | Freq: Once | ORAL | Status: AC

## 2012-12-31 MED ORDER — LORAZEPAM 2 MG/ML IJ SOLN
2.0000 mg | Freq: Once | INTRAMUSCULAR | Status: AC
  Administered 2012-12-31: 2 mg via INTRAMUSCULAR

## 2012-12-31 MED ORDER — HALOPERIDOL 5 MG PO TABS
5.0000 mg | ORAL_TABLET | Freq: Once | ORAL | Status: AC
  Filled 2012-12-31: qty 1

## 2012-12-31 MED ORDER — HYDROXYZINE HCL 25 MG PO TABS
25.0000 mg | ORAL_TABLET | Freq: Four times a day (QID) | ORAL | Status: DC | PRN
  Administered 2012-12-31 – 2013-01-02 (×3): 25 mg via ORAL
  Filled 2012-12-31: qty 10

## 2012-12-31 MED ORDER — HALOPERIDOL LACTATE 5 MG/ML IJ SOLN
5.0000 mg | Freq: Once | INTRAMUSCULAR | Status: AC
  Administered 2012-12-31: 5 mg via INTRAMUSCULAR
  Filled 2012-12-31 (×2): qty 1

## 2012-12-31 MED ORDER — RISPERIDONE 2 MG PO TABS
2.0000 mg | ORAL_TABLET | Freq: Two times a day (BID) | ORAL | Status: DC
  Administered 2012-12-31 – 2013-01-01 (×2): 2 mg via ORAL
  Filled 2012-12-31 (×4): qty 1

## 2012-12-31 NOTE — Progress Notes (Signed)
Recreation Therapy Notes  Date: 04.14.2014       Time: 9:30am Location: [address] Day Room      Group Topic/Focus: Self Expression  Participation Level: Active  Participation Quality: Appropriate  Affect: Euthymic  Cognitive: Oriented   Additional Comments: Activity: Emotion Wheel worksheet & "Who Am I" worksheet Explaination: Patients were given an Emotion Wheel worksheet. Worksheet has a circle divided into 7 equal parts. Patients were asked to identify the following emotions through words or pictures: Anxious, Excited, Happy, Hopeful, Productive, Anxious, Angry. Patients were given a "Who Am I" Worksheet. Worksheet has a circle divided into 6 equal parts. Patients were asked to identify what makes them up. Classical music was played in the background to enhance therapeutic environment.   Patient completed both the Emotion Wheel and Who Am I worksheets provided during group session. Patient shared his Who Am I worksheet with the group. Patient identified his diagnosis, family, friends and God as the primary components he would use to describe himself. Patient interacted with peers and LRT appropriately.    Marykay Lex Markham Dumlao, LRT/CTRS   Jearl Klinefelter 12/31/2012 12:42 PM

## 2012-12-31 NOTE — Tx Team (Signed)
  Interdisciplinary Treatment Plan Update   Date Reviewed:  12/31/2012  Time Reviewed:  8:28 AM  Progress in Treatment:   Attending groups: Yes Participating in groups: Yes Taking medication as prescribed: Yes  Tolerating medication: Yes Family/Significant other contact made: Yes  Patient understands diagnosis: Yes As evidenced by asking for help with psychosis Discussing patient identified problems/goals with staff: Yes Medical problems stabilized or resolved: Yes Denies suicidal/homicidal ideation: No, but able to contract for safety Patient has not harmed self or others: Yes  For review of initial/current patient goals, please see plan of care.  Estimated Length of Stay:  4-5 days  Reason for Continuation of Hospitalization: Depression Hallucinations Medication stabilization Suicidal ideation  New Problems/Goals identified:  N/A  Discharge Plan or Barriers:   Return home, follow up outpt  Additional Comments:  [name] is an 22 y.o. male. Patient is a 22 year old male who has presented voluntarily to the emergency room, with his parents, due to hearing voices which are laughing at him, screaming at him, telling him that people are after him and then telling him he should kill himself. He is having a difficult time staying focused. He reports that he was living in a family care home in [facility], but then moved in with a friend and after about a month, the situation was not good. His father reports that the living conditions were very poor and the patient stated he didn't want to stay there, so his father came and got him and he has been living with his father for one month. His father reports the family care home he was residing in was taking his food stamps and then refused to give him his funds when he left there. They have re-applied for his benefits. He also had ACT Team services, but those services were not transferred when he left there and came here. Since  then, he has greatly decompenstated per his father. The patient reports that he is taking his medications and the pill bottles are appropriately displaying the medications amounts he should have. He states he has had difficulty sleeping at night, mainly due to the voices telling him that people are after him. He states He started using Cannabis again in an effort to decrease the voices, and this helped, but then he ran out of cannabis. He states his appetite is poor, that he is only eating one meal a day; he just doesn't feel hungry. He states that when he was younger, he would punch walls and cut on himself, but he hasn't done that in a very long time. He is very soft spoken, calm; but anxious.   Attendees:  Signature: Thedore Mins, MD 12/31/2012 8:28 AM   Signature: Richelle Ito, [name] 12/31/2012 8:28 AM  Signature: Verne Spurr, PA 12/31/2012 8:28 AM  Signature: Joslyn Devon, RN 12/31/2012 8:28 AM  Signature: Liborio Nixon, RN 12/31/2012 8:28 AM  Signature:  12/31/2012 8:28 AM  Signature:   12/31/2012 8:28 AM  Signature:    Signature:    Signature:    Signature:    Signature:    Signature:      Scribe for Treatment Team:   Richelle Ito, [name]  12/31/2012 8:28 AM

## 2012-12-31 NOTE — Progress Notes (Signed)
The focus of this group is to help patients review their daily goal of treatment and discuss progress on daily workbooks. Pt attended the evening group session and responded to all discussion prompts from the Writer. Pt said that he was "agitated and angry" earlier today was but was able to calm down with the help of nursing staff. Pt complained of a persistent cough today, but did not cough in group. Pt smiled through group and appeared engaged only when speaking/being spoken to. Pt appeared very distracted when others spoke.

## 2012-12-31 NOTE — Progress Notes (Signed)
Patient ID: [name], male   DOB: September 03, 1991, 21 y.o.   MRN: [id number] Patient came to nurses station and stated that he was feeling agitated.  He requested a "shot."  He proceeded to room and hit his fists on the wall and on the window.  Attempted to deescalate patient and he continued to state that he is hearing voices.  He had earlier stated in treatment team meeting that he has "schizophrenic episodes."  [place] [address] for schizophrenia.  He reports voices "people coming after me" and "people watching me."  He reports that when he has these "schizophrenic episodes", they come upon him quickly and "they take over me."  Patient has passive SI, but contracts for safety.  Patient was given 5mg  haldol and 2mg  ativan IM.  Patient is calm and resting quietly in his room.  When assessing patient he states, "I still feel bad, I'm depressed."  He denies any HI, however, is still having AH.    Continue to monitor medication management and MD orders.  Monitor patient's behavior and deescalate when necessary.  Safety checks completed every 15 minutes per protocol.  Patient is calm and cooperative with staff at this time.

## 2012-12-31 NOTE — Progress Notes (Signed)
[facility] MD Progress Note  12/31/2012 11:17 AM [name]  MRN:  [id number] Subjective: "I am hearing voices and seeing things" Objective: Patient reports that he is still hearing voices and seeing things that are not there. He states that he is feeling paranoid, thinking that people are coming after him, watching him. He reports recurrent self harming thoughts. He is easily agitated and has difficulty controlling his anger. He thinks his medications need some adjustment.  Diagnosis:  Schizophrenia paranoid type                     R/O Schizoaffective disorder                     Cannabis abuse   ADL's:  Intact  Sleep: Fair  Appetite:  Fair  Suicidal Ideation: yes Plan:  no Intent:  no Means:  no Homicidal Ideation:  denies AEB (as evidenced by):Patient's report  Psychiatric Specialty Exam: Review of Systems  Constitutional: Negative.   HENT: Negative.   Eyes: Negative.   Respiratory: Negative.   Cardiovascular: Negative.   Gastrointestinal: Negative.   Genitourinary: Negative.   Musculoskeletal: Negative.   Skin: Negative.   Neurological: Positive for dizziness.  Endo/Heme/Allergies: Negative.   Psychiatric/Behavioral: Positive for suicidal ideas, hallucinations and substance abuse. The patient has insomnia.     Blood pressure 144/88, pulse 113, temperature 97 F (36.1 C), temperature source Oral, resp. rate 20, height 5\' 7"  (1.702 m), weight 122.471 kg (270 lb), SpO2 96.00%.Body mass index is 42.28 kg/(m^2).  General Appearance: Fairly Groomed  Patent attorney::  Fair  Speech:  Clear and Coherent  Volume:  Increased  Mood:  Irritable  Affect:  Labile and Full Range  Thought Process:  Goal Directed  Orientation:  Full (Time, Place, and Person)  Thought Content:  Delusions, Hallucinations: Auditory Visual and Paranoid Ideation  Suicidal Thoughts:  Yes.  without intent/plan  Homicidal Thoughts:  No  Memory:  Immediate;   Fair Recent;   Fair Remote;   Fair   Judgement:  Poor  Insight:  Lacking  Psychomotor Activity:  Increased  Concentration:  Fair  Recall:  Fair  Akathisia:  No  Handed:  Right  AIMS (if indicated):     Assets:  Communication Skills Desire for Improvement Social Support  Sleep:  Number of Hours: 5.5   Current Medications: Current Facility-Administered Medications  Medication Dose Route Frequency Provider Last Rate Last Dose  . benztropine (COGENTIN) tablet 1 mg  1 mg Oral Daily Heyward Douthit   1 mg at 12/31/12 4098  . divalproex (DEPAKOTE) DR tablet 500 mg  500 mg Oral BID Jairon Ripberger      . FLUoxetine (PROZAC) capsule 20 mg  20 mg Oral Daily Jorje Guild, PA-C   20 mg at 12/31/12 1191  . hydrOXYzine (ATARAX/VISTARIL) tablet 25 mg  25 mg Oral Q6H PRN Anniece Bleiler      . loratadine (CLARITIN) tablet 10 mg  10 mg Oral Daily Verne Spurr, PA-C   10 mg at 12/31/12 4782  . nicotine (NICODERM CQ - dosed in mg/24 hours) patch 21 mg  21 mg Transdermal Once Bryndan Bilyk   21 mg at 12/30/12 2043  . nicotine (NICODERM CQ - dosed in mg/24 hours) patch 21 mg  21 mg Transdermal Daily Trenice Mesa   21 mg at 12/31/12 0650  . risperiDONE (RISPERDAL) tablet 2 mg  2 mg Oral BID Minal Stuller      . traZODone (DESYREL)  tablet 100 mg  100 mg Oral QHS PRN Lakin Rhine        Lab Results:  Results for orders placed during the hospital encounter of 12/29/12 (from the past 48 hour(s))  VALPROIC ACID LEVEL     Status: Abnormal   Collection Time    12/30/12  7:37 PM      Result Value Range   Valproic Acid Lvl 45.3 (*) 50.0 - 100.0 ug/mL    Physical Findings: AIMS: Facial and Oral Movements Muscles of Facial Expression: None, normal Lips and Perioral Area: None, normal Jaw: None, normal Tongue: None, normal,Extremity Movements Upper (arms, wrists, hands, fingers): None, normal Lower (legs, knees, ankles, toes): None, normal, Trunk Movements Neck, shoulders, hips: None, normal, Overall Severity Severity of abnormal  movements (highest score from questions above): None, normal Incapacitation due to abnormal movements: None, normal Patient's awareness of abnormal movements (rate only patient's report): No Awareness, Dental Status Current problems with teeth and/or dentures?: No Does patient usually wear dentures?: No  CIWA:  CIWA-Ar Total: 0 COWS:     Treatment Plan Summary: Daily contact with patient to assess and evaluate symptoms and progress in treatment Medication management  Plan:1. Admit for crisis management and stabilization. 2. Medication management to reduce current symptoms to base line and improve the patient's overall level of functioning 3. Treat health problems as indicated. 4. Develop treatment plan to decrease risk of relapse upon discharge and the need for readmission. 5. Psycho-social education regarding relapse prevention and self care. 6. Health care follow up as needed for medical problems. 7. Restart home medications where appropriate. 8. Increase depakote ER to 500mg  po BID for labile mood 9. Increase Risperdal to 2mg  po BID for delusions and psychosis 10. Add cogentin 1mg  po BID for EPS prevention   Medical Decision Making Problem Points:  Established problem, worsening (2), Review of last therapy session (1) and Review of psycho-social stressors (1) Data Points:  Order Aims Assessment (2) Review of medication regiment & side effects (2) Review of new medications or change in dosage (2)  I certify that inpatient services furnished can reasonably be expected to improve the patient's condition.   Donella Pascarella,MD 12/31/2012, 11:17 AM

## 2012-12-31 NOTE — BHH Group Notes (Signed)
[facility] [name] Aftercare Discharge Planning Group Note   12/31/2012 11:52 AM  Participation Quality:  Did not attend    [place]

## 2012-12-31 NOTE — Progress Notes (Signed)
Date: 12/31/2012  Time: 11:00am  Group Topic/Focus:  Wellness Toolbox: The focus of this group is to discuss various aspects of wellness, balancing those aspects and exploring ways to increase the ability to experience wellness. Patients will create a wellness toolbox for use upon discharge.  Participation Level: Active  Participation Quality: Appropriate, Sharing and Supportive  Affect: Appropriate and Excited  Cognitive: Appropriate  Insight: Appropriate  Engagement in Group: Engaged and Supportive  Modes of Intervention: Education and Support  Additional Comments: none  [name] M  12/31/2012 12:00pm  

## 2012-12-31 NOTE — Clinical Social Work Note (Signed)
  Type of Therapy: Process Group Therapy  Participation Level:  Minimal  Participation Quality:  Inattentive  Affect:  Flat  Cognitive:  Oriented  Insight:  Limited  Engagement in Group:  Limited  Engagement in Therapy:  Limited  Modes of Intervention:  Activity, Clarification, Education, Problem-solving and Support  Summary of Progress/Problems: Today's group addressed the issue of overcoming obstacles.  Patients were asked to identify their biggest obstacle post d/c that stands in the way of their on-going success, and then problem solve as to how to manage this. [name] states his biggest obstacle is managing his agitation at home with his step mother.  He gets agitated when he hears voices, and he believes he is hearing voices due to the fact that he has not seen a Dr since he moved in with his father  A month ago.  He is willing to sign a release so that I can talk to his former ACT team to strategize on a new referral.       Ida Rogue 12/31/2012   2:17 PM

## 2013-01-01 DIAGNOSIS — I471 Supraventricular tachycardia: Secondary | ICD-10-CM

## 2013-01-01 DIAGNOSIS — F200 Paranoid schizophrenia: Secondary | ICD-10-CM

## 2013-01-01 DIAGNOSIS — F1210 Cannabis abuse, uncomplicated: Secondary | ICD-10-CM

## 2013-01-01 MED ORDER — NICOTINE 21 MG/24HR TD PT24
21.0000 mg | MEDICATED_PATCH | Freq: Every day | TRANSDERMAL | Status: DC
  Administered 2013-01-01 – 2013-01-04 (×4): 21 mg via TRANSDERMAL
  Filled 2013-01-01 (×6): qty 1

## 2013-01-01 MED ORDER — HALOPERIDOL 5 MG PO TABS
5.0000 mg | ORAL_TABLET | Freq: Once | ORAL | Status: AC
  Administered 2013-01-01: 5 mg via ORAL
  Filled 2013-01-01 (×2): qty 1

## 2013-01-01 MED ORDER — HALOPERIDOL 2 MG PO TABS
2.0000 mg | ORAL_TABLET | Freq: Two times a day (BID) | ORAL | Status: DC
  Administered 2013-01-01 – 2013-01-04 (×6): 2 mg via ORAL
  Filled 2013-01-01: qty 1
  Filled 2013-01-01: qty 6
  Filled 2013-01-01 (×2): qty 1
  Filled 2013-01-01: qty 6
  Filled 2013-01-01 (×5): qty 1

## 2013-01-01 NOTE — [facility] Counselor (Signed)
Adult Comprehensive Assessment  Patient ID: [name], male   DOB: 07-10-1991, 22 y.o.   MRN: [id number]  Information Source: Information source: Patient  Current Stressors:  Educational / Learning stressors: N/A Employment / Job issues: Yes   Vocational issues Family Relationships: N/A Surveyor, quantity / Lack of resources (include bankruptcy): Yes  Fixed income Housing / Lack of housing: N/A Physical health (include injuries & life threatening diseases): N/A Social relationships: Yes  None Substance abuse: Yes  Smokes cannabis regularly  Living/Environment/Situation:  Living Arrangements: Parent Living conditions (as described by patient or guardian): me and step mom have "breakable ground" so it is stressful  "I get paranoid because I think she is mad at me." How long has patient lived in current situation?: just a week with dad and step mom-prior to that with a friend for 6 weeks-prior adult group home 6-8 mos.-prior [facility]  What is atmosphere in current home: Comfortable;Loving;Supportive  Family History:  Marital status: Single Does patient have children?: No  Childhood History:  By whom was/is the patient raised?: Father Additional childhood history information: parents divorced at 14-stayed mainly with dad Description of patient's relationship with caregiver when they were a child: everything was decent Patient's description of current relationship with people who raised him/her: used to see her every other week until I moved to [place] Does patient have siblings?: Yes Number of Siblings: 6 Description of patient's current relationship with siblings: get along with them Did patient suffer any verbal/emotional/physical/sexual abuse as a child?: No Did patient suffer from severe childhood neglect?: No Has patient ever been sexually abused/assaulted/raped as an adolescent or adult?: No Was the patient ever a victim of a crime or a disaster?: No Witnessed domestic violence?:  No Has patient been effected by domestic violence as an adult?: No  Education:  Highest grade of school patient has completed: 7th grade Currently a student?: No Learning disability?: No  Employment/Work Situation:   Employment situation: On disability Why is patient on disability: mental How long has patient been on disability: last year What is the longest time patient has a held a job?: 1 month Where was the patient employed at that time?: [employer] Has patient ever been in the [employer]?: No Has patient ever served in Buyer, retail?: No  Financial Resources:   Surveyor, quantity resources: Insurance claims handler Does patient have a Lawyer or guardian?: No  Alcohol/Substance Abuse:   What has been your use of drugs/alcohol within the last 12 months?: smoke weed, 4 bowls a day if I have it Has alcohol/substance abuse ever caused legal problems?: No  Social Support System:   Forensic psychologist System: Good Describe Community Support System: dad, step mom, mom, 2 older sisters Type of faith/religion: [employer]riam Knuckles How does patient's faith help to cope with current illness?: N/A  Leisure/Recreation:   Leisure and Hobbies: hanging out with friends, mechanical work  Strengths/Needs:   What things does the patient do well?: decent with computers In what areas does patient struggle / problems for patient: stress management  Discharge Plan:   Does patient have access to transportation?: Yes Will patient be returning to same living situation after discharge?: Yes  Summary/Recommendations:   Summary and Recommendations (to be completed by the evaluator): [name] is a 22 YO Caucasian male who is here due to an acute exacerbation of psychosis.  He has not taken meds nor seen a Dr for at least 4 weeks.  He is open to an ACT team referral.  [name] can benefit from  crises stabilization, medication managment, therapeutic milieu and referral for services.  Daryel Gerald B. 01/01/2013

## 2013-01-01 NOTE — Progress Notes (Signed)
The focus of this group is to help patients review their daily goal of treatment and discuss progress on daily workbooks. Pt attended the evening group session and responded to all discussion prompts from the Writer. Pt stated that he was agitated earlier today from hearing the voices in his head, but that a talk with the PA greatly calmed him down. Pt reported feeling much better now. Pt smiled throughout group and appeared engaged in the discussion.

## 2013-01-01 NOTE — Progress Notes (Signed)
Patient ID: [name], male   DOB: [birth year], 23 y.o.   MRN: [id number]  D: Earlier in the shift writer observed pt with his head in his hands and legs shaking. Writer introduced herself to all pt and informed that she would speak with them individually. Writer spoke to new pt that approached, then returned to speak with this pt. Asked pt about his day and he stated that he needed to get his haldol. "That's not gonna be enough". Stated that he slept better last night because he'd been given an inj of haldol and ativan. Writer encouraged pt to attempt sleep without having to get inj. Informed pt that he also had trazodone. Pt stated, "I try to get sleep but all that noise in my ears", pt also gestured with his hands. Pt stated sometimes the loud noise make me up. Writer spoke to PA and received an one time dose of 5mg  haldol. Pt was pleased.  A:  Support and encouragement was offered. 15 min checks continued for safety.  R: Pt remains safe.

## 2013-01-01 NOTE — [facility] Group Notes (Signed)
[facility] [name] Aftercare Discharge Planning Group Note   01/01/2013 10:51 AM  Participation Quality:  Engaged  Mood/Affect:  Appropriate  Depression Rating:  unknown  Anxiety Rating:  unknown  Thoughts of Suicide:  No Will you contract for safety?   NA  Current AVH:  Yes  Plan for Discharge/Comments:  Return home  Transportation Means: father  Supports: father, step mom [name] is open to a referral to an ACT team, and signed release for [employer] so I could see if they referred him to anyone when they released him.  [name] hopes to d/c soon.  He says the voices never go away completely, but they only bother him when they become loud and distinguishable.  Furthermore states he hopes to switch over to Haldol and get an injection, per conversation with PA yesterday.  [name]

## 2013-01-01 NOTE — Progress Notes (Signed)
Patient ID: [name], male   DOB: [birth year], 22 y.o.   MRN: [id number] [facility] MD Progress Note  01/01/2013 11:46 AM [name]  MRN:  [id number] Subjective: "The voices are muffled, and now they make me laugh." Pt also says, "I like the medicine I got yesterday, I want to switch to that, I'd rather take the shot." Objective: Patient reports that he is still hearing voices and seeing things that are not there. He did have a difficult day yesterday requiring additional dose of Haldol IM 5mg  and Ativan2mg  to assist him with de-escalatiing. He was headbanging on the wall, which he says he does when he gets anxious. Today he is smiling, calm cooperative and informative. Diagnosis:  Schizophrenia paranoid type                     R/O Schizoaffective disorder                     Cannabis abuse   ADL's:  Intact  Sleep: Fair  Appetite:  Fair  Suicidal Ideation: yes Plan:  no Intent:  no Means:  no Homicidal Ideation:  denies AEB (as evidenced by):Patient's report  Psychiatric Specialty Exam: Review of Systems  Constitutional: Negative.   HENT: Negative.   Eyes: Negative.   Respiratory: Negative.   Cardiovascular: Negative.   Gastrointestinal: Negative.   Genitourinary: Negative.   Musculoskeletal: Negative.   Skin: Negative.   Neurological: Positive for dizziness.  Endo/Heme/Allergies: Negative.   Psychiatric/Behavioral: Positive for suicidal ideas, hallucinations and substance abuse. The patient has insomnia.     Blood pressure 122/86, pulse 120, temperature 97.9 F (36.6 C), temperature source Oral, resp. rate 17, height 5\' 7"  (1.702 m), weight 122.471 kg (270 lb), SpO2 96.00%.Body mass index is 42.28 kg/(m^2).  General Appearance: Fairly Groomed  Patent attorney::  Fair  Speech:  Clear and Coherent  Volume:  Increased  Mood:  Calm cooperative pleasant  Affect:  Labile and Full Range  Thought Process:  Goal Directed  Orientation:  Full (Time, Place, and  Person)  Thought Content:  Delusions, Hallucinations: Auditory Visual and Paranoid Ideation "muffled now."  Suicidal Thoughts:  Yes.  without intent/plan  Homicidal Thoughts:  No  Memory:  Immediate;   Fair Recent;   Fair Remote;   Fair  Judgement:  Poor  Insight:  Lacking  Psychomotor Activity:  Increased  Concentration:  Fair  Recall:  Fair  Akathisia:  No  Handed:  Right  AIMS (if indicated):     Assets:  Communication Skills Desire for Improvement Social Support  Sleep:  Number of Hours: 6.75   Current Medications: Current Facility-Administered Medications  Medication Dose Route Frequency Provider Last Rate Last Dose  . benztropine (COGENTIN) tablet 1 mg  1 mg Oral Daily Mojeed Akintayo   1 mg at 01/01/13 0834  . divalproex (DEPAKOTE) DR tablet 500 mg  500 mg Oral BID Mojeed Akintayo   500 mg at 01/01/13 0834  . FLUoxetine (PROZAC) capsule 20 mg  20 mg Oral Daily Jorje Guild, PA-C   20 mg at 01/01/13 0834  . hydrOXYzine (ATARAX/VISTARIL) tablet 25 mg  25 mg Oral Q6H PRN Mojeed Akintayo   25 mg at 12/31/12 1134  . loratadine (CLARITIN) tablet 10 mg  10 mg Oral Daily Verne Spurr, PA-C   10 mg at 01/01/13 0834  . nicotine (NICODERM CQ - dosed in mg/24 hours) patch 21 mg  21 mg Transdermal Daily Mojeed Akintayo  21 mg at 01/01/13 0644  . risperiDONE (RISPERDAL) tablet 2 mg  2 mg Oral BID Mojeed Akintayo   2 mg at 01/01/13 0834  . traZODone (DESYREL) tablet 100 mg  100 mg Oral QHS PRN Mojeed Akintayo        Lab Results:  Results for orders placed during the hospital encounter of 12/29/12 (from the past 48 hour(s))  VALPROIC ACID LEVEL     Status: Abnormal   Collection Time    12/30/12  7:37 PM      Result Value Range   Valproic Acid Lvl 45.3 (*) 50.0 - 100.0 ug/mL    Physical Findings: AIMS: Facial and Oral Movements Muscles of Facial Expression: None, normal Lips and Perioral Area: None, normal Jaw: None, normal Tongue: None, normal,Extremity Movements Upper (arms,  wrists, hands, fingers): None, normal Lower (legs, knees, ankles, toes): None, normal, Trunk Movements Neck, shoulders, hips: None, normal, Overall Severity Severity of abnormal movements (highest score from questions above): None, normal Incapacitation due to abnormal movements: None, normal Patient's awareness of abnormal movements (rate only patient's report): No Awareness, Dental Status Current problems with teeth and/or dentures?: No Does patient usually wear dentures?: No  CIWA:  CIWA-Ar Total: 0 COWS:     Treatment Plan Summary: Daily contact with patient to assess and evaluate symptoms and progress in treatment Medication management  Plan:1. Admit for crisis management and stabilization. 2. Medication management to reduce current symptoms to base line and improve the patient's overall level of functioning 3. Treat health problems as indicated. 4. Develop treatment plan to decrease risk of relapse upon discharge and the need for readmission. 5. Psycho-social education regarding relapse prevention and self care. 6. Health care follow up as needed for medical problems. 7. Restart home medications where appropriate. 8. Continue depakote ER to 500mg  po BID for labile mood 9. After discussion with MD will change patient over to Haldol po to start with 2mg  po BID to move toward the Decanoate dosage. 10. Will also get EKG due to the tachycardia. 11. Will continue cogentin 1mg  po qd for EPS prevention 12. ELOS: 2-4 days  Medical Decision Making Problem Points:  Established problem, worsening (2), Review of last therapy session (1) and Review of psycho-social stressors (1) Data Points:  Order Aims Assessment (2) Review of medication regiment & side effects (2) Review of new medications or change in dosage (2)  I certify that inpatient services furnished can reasonably be expected to improve the patient's condition.   Rona Ravens. Calley Drenning RPAC 01/01/2013, 11:46 AM

## 2013-01-01 NOTE — Progress Notes (Signed)
Patient ID: [name], male   DOB: January 09, 1991, 22 y.o.   MRN: [id number]   D: Pt was cooperative during the assessment. Pt slept most of the shift other than evening group. Pt voiced no questions or concerns during the assessment.  A:  Support and encouragement was offered. 15 min checks continued for safety.  R: Pt remains safe.

## 2013-01-01 NOTE — Clinical Social Work Note (Signed)
[facility] [name] Group Therapy  01/01/2013 , 12:17 PM   Type of Therapy:  Group Therapy  Participation Level:  Active  Participation Quality:  Attentive  Affect:  Appropriate  Cognitive:  Alert  Insight:  Improving  Engagement in Therapy:  Engaged  Modes of Intervention:  Discussion, Exploration and Socialization  Summary of Progress/Problems: Today's group focused on the term Diagnosis.  Participants were asked to define the term, and then pronounce whether it is a negative, positive or neutral term.  [name] defined the term as "finding out what is wrong with you."  He spoke of how others appear fearful of those with a mental health dx., or take advantage of them.  [name] B 01/01/2013 , 12:17 PM

## 2013-01-02 MED ORDER — HALOPERIDOL DECANOATE 100 MG/ML IM SOLN
50.0000 mg | Freq: Once | INTRAMUSCULAR | Status: AC
  Administered 2013-01-02: 50 mg via INTRAMUSCULAR
  Filled 2013-01-02: qty 0.5

## 2013-01-02 NOTE — Progress Notes (Signed)
The focus of this group is to help patients review their daily goal of treatment and discuss progress on daily workbooks. Pt attended the evening group session and responded to all discussion prompts. Pt said that he had a good day for most of today but that he was starting to get worked up after a conversation with his father. "My Dad wants me to stay here for a few more days. I think he just wants to figure something out so he can kick me out of the house." Pt was visibly upset, rocking in his seat and breathing heavily. The Writer suggested the Pt excuse himself to ask if his RN had anything to help him, which Pt agreed was a good idea and left the group. Pt returned to group much more calm, but was later seen and heard screaming, cursing, pacing and punching things in his room shortly after group. Pt did not respond to staff upon approach during this episode.

## 2013-01-02 NOTE — Progress Notes (Signed)
Patient ID: [name], male   DOB: [birth year], 22 y.o.   MRN: [id number] [facility] MD Progress Note  01/02/2013 11:04 AM [name]  MRN:  [id number] Subjective: "Better today." "I don't know if I'm manic, or just feel better because I slept." and "when can I get out of [place] [facility]?". Objective: [name] is in a good mood today, there has been no headbanging since yesterday. He was most anxious yesterday and lobbied hard to get Haldol. He was tachycardic so an EKG was done. The results were normal and the QT/QTc rates were 374/423 respectively. He was switched to Haldol 2mg  po BID. He had already had one dose of Risperdal 2mg  yesterday morning, received a dose of Haldol 2mg  at 4pm, and requested a 2nd dose of Haldol at 7pm. He was given a 1 time dose of Haldol 5mg . And then his second dose of haldol 2mg  at 11pm.    He states he is calmer, the voices are resolving and feels he is more in control of his behaviors.  Diagnosis:  Schizophrenia paranoid type                     R/O Schizoaffective disorder                     Cannabis abuse   ADL's:  Intact  Sleep: "good"  Appetite:  Fair  Suicidal Ideation: yes Plan:  no Intent:  no Means:  no Homicidal Ideation:  denies AEB (as evidenced by):Patient's report  Psychiatric Specialty Exam: Review of Systems  Constitutional: Negative.  Negative for fever, chills, weight loss, malaise/fatigue and diaphoresis.  HENT: Negative.  Negative for congestion and sore throat.   Eyes: Negative.  Negative for blurred vision, double vision and photophobia.  Respiratory: Negative.  Negative for cough, shortness of breath and wheezing.   Cardiovascular: Negative.  Negative for chest pain, palpitations and PND.  Gastrointestinal: Negative.  Negative for heartburn, nausea, vomiting, abdominal pain, diarrhea and constipation.  Genitourinary: Negative.   Musculoskeletal: Negative.  Negative for myalgias, joint pain and falls.  Skin:  Negative.   Neurological: Negative for dizziness, tingling, tremors, sensory change, speech change, focal weakness, seizures, loss of consciousness, weakness and headaches.  Endo/Heme/Allergies: Negative.  Negative for polydipsia. Does not bruise/bleed easily.  Psychiatric/Behavioral: Positive for hallucinations. Negative for depression, suicidal ideas, memory loss and substance abuse. The patient is not nervous/anxious and does not have insomnia.     Blood pressure 118/84, pulse 97, temperature 98.2 F (36.8 C), temperature source Oral, resp. rate 17, height 5\' 7"  (1.702 m), weight 122.471 kg (270 lb), SpO2 96.00%.Body mass index is 42.28 kg/(m^2).  General Appearance: Fairly Groomed  Patent attorney::  Fair  Speech:  Clear and Coherent  Volume:  Increased  Mood:  Calm cooperative pleasant  Affect:  Labile and Full Range  Thought Process:  Goal Directed  Orientation:  Full (Time, Place, and Person)  Thought Content:  Delusions, Hallucinations: Auditory Visual and Paranoid Ideation "muffled now."  Suicidal Thoughts:  Yes.  without intent/plan  Homicidal Thoughts:  No  Memory:  Immediate;   Fair Recent;   Fair Remote;   Fair  Judgement:  Poor  Insight:  Lacking  Psychomotor Activity:  Increased  Concentration:  Fair  Recall:  Fair  Akathisia:  No  Handed:  Right  AIMS (if indicated):     Assets:  Communication Skills Desire for Improvement Social Support  Sleep:  Number of Hours: 6.75  Current Medications: Current Facility-Administered Medications  Medication Dose Route Frequency Provider Last Rate Last Dose  . benztropine (COGENTIN) tablet 1 mg  1 mg Oral Daily Mojeed Akintayo   1 mg at 01/01/13 0834  . divalproex (DEPAKOTE) DR tablet 500 mg  500 mg Oral BID Mojeed Akintayo   500 mg at 01/01/13 0834  . FLUoxetine (PROZAC) capsule 20 mg  20 mg Oral Daily Jorje Guild, PA-C   20 mg at 01/01/13 0834  . hydrOXYzine (ATARAX/VISTARIL) tablet 25 mg  25 mg Oral Q6H PRN Mojeed Akintayo   25  mg at 12/31/12 1134  . loratadine (CLARITIN) tablet 10 mg  10 mg Oral Daily Verne Spurr, PA-C   10 mg at 01/01/13 0834  . nicotine (NICODERM CQ - dosed in mg/24 hours) patch 21 mg  21 mg Transdermal Daily Mojeed Akintayo   21 mg at 01/01/13 0644  . risperiDONE (RISPERDAL) tablet 2 mg  2 mg Oral BID Mojeed Akintayo   2 mg at 01/01/13 0834  . traZODone (DESYREL) tablet 100 mg  100 mg Oral QHS PRN Mojeed Akintayo        Lab Results:  Results for orders placed during the hospital encounter of 12/29/12 (from the past 48 hour(s))  VALPROIC ACID LEVEL     Status: Abnormal   Collection Time    12/30/12  7:37 PM      Result Value Range   Valproic Acid Lvl 45.3 (*) 50.0 - 100.0 ug/mL    Physical Findings: AIMS: Facial and Oral Movements Muscles of Facial Expression: None, normal Lips and Perioral Area: None, normal Jaw: None, normal Tongue: None, normal,Extremity Movements Upper (arms, wrists, hands, fingers): None, normal Lower (legs, knees, ankles, toes): None, normal, Trunk Movements Neck, shoulders, hips: None, normal, Overall Severity Severity of abnormal movements (highest score from questions above): None, normal Incapacitation due to abnormal movements: None, normal Patient's awareness of abnormal movements (rate only patient's report): No Awareness, Dental Status Current problems with teeth and/or dentures?: No Does patient usually wear dentures?: No  CIWA:  CIWA-Ar Total: 0 COWS:     Treatment Plan Summary: Daily contact with patient to assess and evaluate symptoms and progress in treatment Medication management  Plan:1. Admit for crisis management and stabilization. 2. Medication management to reduce current symptoms to base line and improve the patient's overall level of functioning 3. Treat health problems as indicated. 4. Develop treatment plan to decrease risk of relapse upon discharge and the need for readmission. 5. Psycho-social education regarding relapse  prevention and self care. 6. Health care follow up as needed for medical problems. 7. Restart home medications where appropriate. 8. Continue depakote ER to 500mg  po BID for labile mood 9. Continue Haldol 2mg  po BID. 10. Will start haldol Decanoate 50mg  IM today. 11. Will evaluate in AM for d/c Thursday or Friday.  Medical Decision Making Problem Points:  Established problem, worsening (2), Review of last therapy session (1) and Review of psycho-social stressors (1) Data Points:  Order Aims Assessment (2) Review of medication regiment & side effects (2) Review of new medications or change in dosage (2)  I certify that inpatient services furnished can reasonably be expected to improve the patient's condition.   Rona Ravens. Rece Zechman RPAC 01/02/2013, 11:04 AM

## 2013-01-02 NOTE — Progress Notes (Signed)
D: Patient denies SI/HI and auditory and visual hallucinations. Patient has an anxious mood and affect. Patient rates his depression and hopelessness both a 0 out of 10 (1 low/10 high). The patient reports sleeping well and states that his appetite is good and that his energy level is normal. The patient is attending groups and interacting appropriately within the milieu. The patient reports wanting to "find ways to occupy the mind" in order to prevent future relapses.  A: Patient given emotional support from RN. Patient encouraged to come to staff with concerns and/or questions. Patient's medication routine continued. Patient's orders and plan of care reviewed.  R: Patient remains cooperative. Will continue to monitor patient q15 minutes for safety.

## 2013-01-02 NOTE — [facility] Group Notes (Signed)
[facility] [name] Group Therapy  01/02/2013 1:29 PM   Type of Therapy:  Group Therapy  Participation Level:  Active  Participation Quality:  Appropriate  Affect:  Appropriate  Cognitive:  Alert  Insight:  Improving  Engagement in Group:  Improving  Engagement in Therapy:  Improving  Modes of Intervention:  Discussion, Education, Socialization and Support  Summary of Progress/Problems: MHA: Patient was attentive and engaged with speaker from [employer]. Patient quietly listened as speaker talked about his personal experience with mental illness/SA. Pt pulled out of group by PA after about 30 minutes and did not return.    Smart, Heather N 01/02/2013, 1:29 PM

## 2013-01-02 NOTE — Progress Notes (Signed)
Date: 01/02/2013  Time: 11:39 AM  Group Topic/Focus:  Crisis Planning: The purpose of this group is to help patients create a crisis plan for use upon discharge or in the future, as needed.  Participation Level: Active  Participation Quality: Appropriate, Sharing and Supportive  Affect: Anxious and Appropriate  Cognitive: Appropriate  Insight: Appropriate  Engagement in Group: Engaged and Supportive  Modes of Intervention: Education, Problem-solving and Support  Additional Comments: none  Rayyan Orsborn M  01/02/2013, 11:39 AM  

## 2013-01-02 NOTE — Progress Notes (Signed)
Recreation Therapy Notes  Date: 04.16.2014  Time: 9:30am  Location: [address] Day Room   Group Topic/Focus: Decision Making   Participation Level:  Active   Participation Quality:  Appropriate, Sharing   Affect:  Euthymic   Cognitive:  Appropriate   Additional Comments: Activity:Would you Rather & If; Explanation: Would you Rather - Patients were individually asked an either/or question (i.e.: Would you rather eat a blue M&M or a red M&M). Patient's were asked to explain why they chose the option they did. If - Patients were individually asked a "If you could... " (i.e. If you could work anywhere in the world where would you work?) question. Patient answered the questions and explained why they chose the answer they did.   Patient was in day room when LRT entered space. Patient excused himself shortly after LRT arrival stating he needed to use the restroom. Patient returned as first activity was ending. Patient actively participated in group activities. When asked "Would you rather make headlines for saving someone's life or winning a Nobel peace prize?" Patient stated "saving someone's life so I would be known for something good."  When asked "If you could wish on things to come true this year what would it be?" Patient responded "My dad to retire and my step-mom to get better." Patient explained that step-mother is dealing with various behavioral health concerns and he wants her to get better. LRT asked patient if he had the same wish for himself. Patient stated that he wants to get help. Participated participated in group discussion about decision making and what influences the decisions we make.   Marykay Lex [name], LRT/CTRS   Jearl Klinefelter 01/02/2013 12:46 PM

## 2013-01-02 NOTE — [facility] Group Notes (Signed)
[facility] [name] Aftercare Discharge Planning Group Note   01/02/2013 10:13 AM  Participation Quality:  appropriate  Mood/Affect:  Appropriate  Depression Rating:  6  Anxiety Rating:  5  Thoughts of Suicide:  No Will you contract for safety?   NA  Current AVH:  Yes  Plan for Discharge/Comments:  Pt plans to return home to live in [place] with his father and stepmother. Currently exploring ACT team services in this area.   Transportation Means: father  Supports: father and stepmother  Patient reports AH that are strong this morning and throughout group, laughed inappropriatly. When asked, pt stated that "it's like they are sitting right next to me. It gets annoying sometimes." pt states that music often helps him to cope with his AH.   Smart, Ledell Peoples

## 2013-01-03 MED ORDER — DIPHENHYDRAMINE HCL 50 MG/ML IJ SOLN: 50.0000 mg | Freq: Once | INTRAMUSCULAR | Status: AC | PRN

## 2013-01-03 MED ORDER — HALOPERIDOL LACTATE 5 MG/ML IJ SOLN
10.0000 mg | Freq: Once | INTRAMUSCULAR | Status: DC
  Filled 2013-01-03: qty 2

## 2013-01-03 MED ORDER — HALOPERIDOL LACTATE 5 MG/ML IJ SOLN
5.0000 mg | Freq: Once | INTRAMUSCULAR | Status: DC
  Filled 2013-01-03: qty 1

## 2013-01-03 MED ORDER — LORAZEPAM 2 MG/ML IJ SOLN: 2.0000 mg | Freq: Once | INTRAMUSCULAR | Status: DC

## 2013-01-03 MED ORDER — DIPHENHYDRAMINE HCL 50 MG/ML IJ SOLN
50.0000 mg | Freq: Once | INTRAMUSCULAR | Status: DC
  Filled 2013-01-03: qty 1

## 2013-01-03 NOTE — BHH Group Notes (Cosign Needed)
[facility] [name] Group Therapy  01/03/2013 2:33 PM   Type of Therapy:  Group Therapy  Participation Level:  Active  Participation Quality:  Appropriate and Attentive  Affect:  Appropriate  Cognitive:  Appropriate  Insight:  Improving  Engagement in Group:  Engaged  Engagement in Therapy:  Engaged  Modes of Intervention:  Discussion, Education, Role-play, Socialization and Support  Summary of Progress/Problems: Topic-Balance: The topic for group was balance in life. Pt participated in the discussion about when their life was in balance and out of balance and how this feels. Pt discussed ways to get back in balance and short term goals they can work on to get where they want to be.  [name] talked about how anger often throws him off balance and brainstormed ways that he can address this problem. He expressed that at times, he feels paranoid that people are talking about him or do not like him. [name] participated in a roleplay where he was asked to demonstrate how he thought this type of situation should be handled. Afterward, he stated that addressing the problem head on by confronting the individual and getting clarification about the situation is necessary.    Kailer Heindel, Ledell Peoples 01/03/2013, 2:33 PM

## 2013-01-03 NOTE — B[facility] Group Notes (Signed)
[facility] [name] Aftercare Discharge Planning Group Note   01/03/2013 10:47 AM  Participation Quality:  Appropriate  Mood/Affect:  Appropriate  Depression Rating:  n/a  Anxiety Rating:  n/a  Thoughts of Suicide:  No Will you contract for safety?   NA  Current AVH:  No  Plan for Discharge/Comments:  Pt plans to either return home and follow up with ACT team or find alternative place to live. Pt's father hesitant to allow pt to return home due to other family illness problems. Pt reports speaking to his father last night and stated that it was an upsetting conversation.   Transportation Means: family   Supports: father  Counselling psychologist, Ledell Peoples

## 2013-01-03 NOTE — Progress Notes (Signed)
Patient ID: FENDER HERDER, male   DOB: Feb 08, 1991, 23 y.o.   MRN: [id number] D: Took over patient's care @ 2330. Patient in bed sleeping. Respiration regular and unlabored. No sign of distress noted at this time A: 15 mins checks for safety. R: Patient remains asleep. Pt is safe.

## 2013-01-03 NOTE — Progress Notes (Signed)
Patient ID: [name], male   DOB: 1991-03-03, 22 y.o.   MRN: [id number] [facility] MD Progress Note  01/03/2013 10:17 AM [name]  MRN:  [id number] Subjective: "I am upset today because my father doesn't want me at home" Objective: Patient reports that he is upset and angry today because his father does not want him back home due to his behavior. He reports that he is no longer  hearing voices and seeing things that are not there. He denies delusions, suicidal and homicidal ideation, intent or plan. Reports that he is happy with his medication regimen and did not report any adverse reactions.  Diagnosis:  Schizophrenia paranoid type                     R/O Schizoaffective disorder                     Cannabis abuse   ADL's:  Intact  Sleep: Fair  Appetite:  Fair  Suicidal Ideation: yes Plan:  no Intent:  no Means:  no Homicidal Ideation:  denies AEB (as evidenced by):Patient's report  Psychiatric Specialty Exam: Review of Systems  Constitutional: Negative.   HENT: Negative.   Eyes: Negative.   Respiratory: Negative.   Cardiovascular: Negative.   Gastrointestinal: Negative.   Genitourinary: Negative.   Musculoskeletal: Negative.   Skin: Negative.   Neurological: Negative.   Endo/Heme/Allergies: Negative.   Psychiatric/Behavioral: Negative.     Blood pressure 122/81, pulse 109, temperature 97.9 F (36.6 C), temperature source Oral, resp. rate 17, height 5\' 7"  (1.702 m), weight 122.471 kg (270 lb), SpO2 96.00%.Body mass index is 42.28 kg/(m^2).  General Appearance: Fairly Groomed  Patent attorney::  Fair  Speech:  Clear and Coherent  Volume:  Increased  Mood:  Irritable  Affect:  Labile and Full Range  Thought Process:  Goal Directed  Orientation:  Full (Time, Place, and Person)  Thought Content:  Delusions, Hallucinations: Auditory Visual and Paranoid Ideation  Suicidal Thoughts:  Yes.  without intent/plan  Homicidal Thoughts:  No  Memory:  Immediate;    Fair Recent;   Fair Remote;   Fair  Judgement:  Poor  Insight:  Lacking  Psychomotor Activity:  Increased  Concentration:  Fair  Recall:  Fair  Akathisia:  No  Handed:  Right  AIMS (if indicated):     Assets:  Communication Skills Desire for Improvement Social Support  Sleep:  Number of Hours: 6   Current Medications: Current Facility-Administered Medications  Medication Dose Route Frequency Provider Last Rate Last Dose  . benztropine (COGENTIN) tablet 1 mg  1 mg Oral Daily Walter Grima   1 mg at 01/03/13 0825  . diphenhydrAMINE (BENADRYL) injection 50 mg  50 mg Intravenous Once Allana Shrestha      . divalproex (DEPAKOTE) DR tablet 500 mg  500 mg Oral BID Quamaine Webb   500 mg at 01/03/13 0825  . FLUoxetine (PROZAC) capsule 20 mg  20 mg Oral Daily Jorje Guild, PA-C   20 mg at 01/03/13 0825  . haloperidol (HALDOL) tablet 2 mg  2 mg Oral BID Verne Spurr, PA-C   2 mg at 01/03/13 0825  . haloperidol lactate (HALDOL) injection 10 mg  10 mg Intramuscular Once Candelario Steppe      . hydrOXYzine (ATARAX/VISTARIL) tablet 25 mg  25 mg Oral Q6H PRN Jeniel Slauson   25 mg at 01/02/13 2027  . loratadine (CLARITIN) tablet 10 mg  10 mg Oral Daily Lloyd Huger  Mashburn, PA-C   10 mg at 01/03/13 0825  . LORazepam (ATIVAN) injection 2 mg  2 mg Intramuscular Once Darrik Richman      . nicotine (NICODERM CQ - dosed in mg/24 hours) patch 21 mg  21 mg Transdermal Daily Idaly Verret   21 mg at 01/03/13 0825  . traZODone (DESYREL) tablet 100 mg  100 mg Oral QHS PRN Arshiya Jakes   100 mg at 01/02/13 2107    Lab Results:  No results found for this or any previous visit (from the past 48 hour(s)).  Physical Findings: AIMS: Facial and Oral Movements Muscles of Facial Expression: None, normal Lips and Perioral Area: None, normal Jaw: None, normal Tongue: None, normal,Extremity Movements Upper (arms, wrists, hands, fingers): None, normal Lower (legs, knees, ankles, toes): None, normal, Trunk  Movements Neck, shoulders, hips: None, normal, Overall Severity Severity of abnormal movements (highest score from questions above): None, normal Incapacitation due to abnormal movements: None, normal Patient's awareness of abnormal movements (rate only patient's report): No Awareness, Dental Status Current problems with teeth and/or dentures?: No Does patient usually wear dentures?: No  CIWA:  CIWA-Ar Total: 0 COWS:     Treatment Plan Summary: Daily contact with patient to assess and evaluate symptoms and progress in treatment Medication management  Plan:1. Admit for crisis management and stabilization. 2. Medication management to reduce current symptoms to base line and improve the patient's overall level of functioning 3. Treat health problems as indicated. 4. Develop treatment plan to decrease risk of relapse upon discharge and the need for readmission. 5. Psycho-social education regarding relapse prevention and self care. 6. Health care follow up as needed for medical problems. 7. Restart home medications where appropriate. 8. Continue depakote ER to 500mg  po BID for labile mood 9. Continue Haldol  2mg  po BID for delusions and psychosis 10.Continue cogentin 1mg  po BID for EPS prevention   Medical Decision Making Problem Points:  Established problem, improving (2), Review of last therapy session (1) and Review of psycho-social stressors (1) Data Points:  Order Aims Assessment (2) Review of medication regiment & side effects (2) Review of new medications or change in dosage (2)  I certify that inpatient services furnished can reasonably be expected to improve the patient's condition.   Legna Mausolf,MD 01/03/2013, 10:17 AM

## 2013-01-04 MED ORDER — TRAZODONE HCL 100 MG PO TABS: 100.0000 mg | ORAL_TABLET | Freq: Every day | ORAL | Status: DC

## 2013-01-04 MED ORDER — DIVALPROEX SODIUM 500 MG PO DR TAB: 500.0000 mg | DELAYED_RELEASE_TABLET | Freq: Two times a day (BID) | ORAL | Status: DC

## 2013-01-04 MED ORDER — HALOPERIDOL 2 MG PO TABS: 2.0000 mg | ORAL_TABLET | Freq: Two times a day (BID) | ORAL | Status: DC

## 2013-01-04 MED ORDER — HYDROXYZINE HCL 25 MG PO TABS: 25.0000 mg | ORAL_TABLET | Freq: Four times a day (QID) | ORAL | Status: DC | PRN

## 2013-01-04 MED ORDER — FLUOXETINE HCL 20 MG PO TABS: 20.0000 mg | ORAL_TABLET | Freq: Every day | ORAL | Status: DC

## 2013-01-04 MED ORDER — BENZTROPINE MESYLATE 1 MG PO TABS: 1.0000 mg | ORAL_TABLET | Freq: Every day | ORAL | Status: DC

## 2013-01-04 MED ORDER — HALOPERIDOL DECANOATE 50 MG/ML IM SOLN: 50.0000 mg | INTRAMUSCULAR | Status: DC

## 2013-01-04 NOTE — Progress Notes (Signed)
[facility] Adult Case Management Discharge Plan :  Will you be returning to the same living situation after discharge: Yes,  home At discharge, do you have transportation home?:Yes,  family Do you have the ability to pay for your medications:Yes,  MCD  Release of information consent forms completed and in the chart;  Patient's signature needed at discharge.  Patient to Follow up at: Follow-up Information   Follow up with [facility] ACT team On 01/08/2013. (Tuesday between 8 and 9 AM for your appointment.  I gave Calvin with the ACT team your number so he could confirm.  If you have not heard from him by Monday, call him at [phone] to confirm actual time.)    Contact information:   [address], [place]  [phone]      Follow up with Possible living situation.   Contact information:   [facility]   in [place]  [address]  [phone]  Cell [phone]      Patient denies SI/HI:   Yes,  yes    Safety Planning and Suicide Prevention discussed:  Yes,  yes  Ida Rogue 01/04/2013, 9:04 AM

## 2013-01-04 NOTE — Progress Notes (Signed)
Patient ID: [name], male   DOB: [birth year], 22 y.o.   MRN: [id number] D: Patient mood/affect is appropriate. Pt denies SI/HI/AVH and pain. Pt attended evening karaoke group and Interacted appropriately with peers. Pt stated he is excited about discharge tomorrow. Pt denies any needs or concerns.  Cooperative with assessment. No acute distressed noted at this time.   A: Met with pt 1:1. Medications administered as prescribed. Writer encouraged pt to discuss feelings. Pt encouraged to come to staff with any question or concerns. 15 minutes checks for safety.  R: Patient remains safe. He is complaint with medications and group programming. Continue current POC.

## 2013-01-04 NOTE — Tx Team (Signed)
  Interdisciplinary Treatment Plan Update   Date Reviewed:  01/04/2013  Time Reviewed:  10:11 AM  Progress in Treatment:   Attending groups: Yes Participating in groups: Yes Taking medication as prescribed: Yes  Tolerating medication: Yes Family/Significant other contact made: Yes  Patient understands diagnosis: Yes As evidenced by asking for help with psychosis Discussing patient identified problems/goals with staff: Yes Medical problems stabilized or resolved: Yes Denies suicidal/homicidal ideation: Yes  In tx team Patient has not harmed self or others: Yes  For review of initial/current patient goals, please see plan of care.  Estimated Length of Stay:D/C today  Reason for Continuation of Hospitalization:   New Problems/Goals identified:  N/A  Discharge Plan or Barriers:   Return home, follow up outpt  Additional Comments:     Attendees:  Signature: Thedore Mins, MD 01/04/2013 10:11 AM   Signature: Richelle Ito, [name] 01/04/2013 10:11 AM  Signature: Verne Spurr, PA 01/04/2013 10:11 AM  Signature:  01/04/2013 10:11 AM  Signature:  01/04/2013 10:11 AM  Signature:  01/04/2013 10:11 AM  Signature:   01/04/2013 10:11 AM  Signature:    Signature:    Signature:    Signature:    Signature:    Signature:      Scribe for Treatment Team:   Richelle Ito, [name]  01/04/2013 10:11 AM

## 2013-01-04 NOTE — Progress Notes (Signed)
Adult Psychoeducational Group Note  Date:  01/04/2013 Time:  2000  Group Topic/Focus:  Karaoke night   Participation Level:  Active  Participation Quality:  Appropriate  Affect:  Appropriate  Cognitive:  Appropriate  Insight: Appropriate  Engagement in Group:  Engaged  Modes of Intervention:  Activity  Additional Comments:  Pt attended karaoke and participated in group this evening.   Rajveer Handler A 01/04/2013, 12:52 AM

## 2013-01-04 NOTE — Progress Notes (Signed)
Yvon was discharged home with his dad.  Discharge instructions, sample medications, follow up instructions, and prescriptions given to patient who verbalized understanding.  Personal belongings returned to patient from locker.

## 2013-01-04 NOTE — Discharge Summary (Signed)
Physician Discharge Summary Note  Patient:  [name] is an 22 y.o., male MRN:  [id number] DOB:  [birth year] Patient phone:  [phone] (home)  Patient address:   [address] [place] [address],   Date of Admission:  12/29/2012 Date of Discharge: 01/04/2013  Reason for Admission:  psychosis  Discharge Diagnoses: Principal Problem:   Schizophrenia Active Problems:   Marijuana abuse   Paroxysmal supraventricular tachycardia  Review of Systems  Constitutional: Negative.  Negative for fever, chills, weight loss, malaise/fatigue and diaphoresis.  HENT: Negative for congestion and sore throat.   Eyes: Negative for blurred vision, double vision and photophobia.  Respiratory: Negative for cough, shortness of breath and wheezing.   Cardiovascular: Negative for chest pain, palpitations and PND.  Gastrointestinal: Negative for heartburn, nausea, vomiting, abdominal pain, diarrhea and constipation.  Musculoskeletal: Negative for myalgias, joint pain and falls.  Neurological: Negative for dizziness, tingling, tremors, sensory change, speech change, focal weakness, seizures, loss of consciousness, weakness and headaches.  Endo/Heme/Allergies: Negative for polydipsia. Does not bruise/bleed easily.  Psychiatric/Behavioral: Negative for depression, suicidal ideas, hallucinations, memory loss and substance abuse. The patient is not nervous/anxious and does not have insomnia.    Discharge Diagnoses:  AXIS I: Schizoaffective Disorder, Marijuana absue  AXIS II: Deferred  AXIS III:  Past Medical History   Diagnosis  Date   .  Schizophrenia    .  Hypertension    .  High cholesterol    .  Atrial fibrillation     AXIS IV: other psychosocial or environmental problems  AXIS V: 61-70 mild symptoms  Level of Care:  OP  Hospital Course:  [name] was admitted for suicidal ideation and worsening psychosis. He presented to the ED with these complaints and was transferred to [facility] for  further stabilization and treatment.     He was started on Risperdal as he had been on that most recently but had started using increased amounts of marijuana for self medication as he was out of his medication.  The patient was seen and evaluated by the Treatment team consisting of Psychiatrist, PAC, RN, Case Manager, and Therapist for evaluation and treatment plan with goal of stabilization upon discharge. The patient's physical and mental health problems were identified and treated appropriately.      Multiple modalities of treatment were used including medication, individual and group therapies, unit programming, improved nutrition, physical activity, and family sessions as needed.     The symptoms of psychosis were monitored daily by evaluation by clinical provider.  The patient's mental and emotional status was evaluated by a daily self inventory completed by the patient.      Improvement was demonstrated by declining numbers on the self assessment, improving vital signs, increased cognition, and improvement in mood, sleep, appetite as well as a reduction in physical symptoms.        [name] was noted to begin head banging on the 2nd day of his stay, stating that he often had rage periods that lasted 1 hour or so, especially if he did not get his way. He was given IM haldol and Ativan which he stated helped a great deal.  He continued to require extra doses of Haldol and stated that it worked much better than the Risperidol he was on currently. He also noted that he was on much higher doses of Risperdol at the group home.  [name] stated that he would like to be on Haldol decanoate since it would be some time before he could  see his new provider.  He was switched to Haldol Decanoate IM the day prior to discharge but continued on the po Haldol as well.      On the day of his discharge [name] noted that he felt much better, had no SI/HI and that the voices had decreased significantly.       The patient was evaluated and found to be stable enough for discharge and was released to per the initial plan of treatment.   Mental Status Exam:  For mental status exam please see mental status exam and  suicide risk assessment completed by attending physician prior to discharge.   Consults:  None  Significant Diagnostic Studies:  CBC-diff, CMP, UA/UDS, VPA level, EKG  Discharge Vitals:   Blood pressure 130/82, pulse 103, temperature 97.5 F (36.4 C), temperature source Oral, resp. rate 18, height 5\' 7"  (1.702 m), weight 122.471 kg (270 lb), SpO2 96.00%. Body mass index is 42.28 kg/(m^2). Lab Results:   No results found for this or any previous visit (from the past 72 hour(s)).  Physical Findings: AIMS: Facial and Oral Movements Muscles of Facial Expression: None, normal Lips and Perioral Area: None, normal Jaw: None, normal Tongue: None, normal,Extremity Movements Upper (arms, wrists, hands, fingers): None, normal Lower (legs, knees, ankles, toes): None, normal, Trunk Movements Neck, shoulders, hips: None, normal, Overall Severity Severity of abnormal movements (highest score from questions above): None, normal Incapacitation due to abnormal movements: None, normal Patient's awareness of abnormal movements (rate only patient's report): No Awareness, Dental Status Current problems with teeth and/or dentures?: No Does patient usually wear dentures?: No  CIWA:  CIWA-Ar Total: 0 COWS:     Psychiatric Specialty Exam: See Psychiatric Specialty Exam and Suicide Risk Assessment completed by Attending Physician prior to discharge.  Discharge destination:  Home  Is patient on multiple antipsychotic therapies at discharge:  No   Has Patient had three or more failed trials of antipsychotic monotherapy by history:  No  Recommended Plan for Multiple Antipsychotic Therapies: Not applicable  Discharge Orders   Future Orders Complete By Expires     Diet - low sodium heart healthy  As  directed     Discharge instructions  As directed     Comments:      Take all of your medications as directed. Be sure to keep all of your follow up appointments.  If you are unable to keep your follow up appointment, call your Doctor's office to let them know, and reschedule.  Make sure that you have enough medication to last until your appointment. Be sure to get plenty of rest. Going to bed at the same time each night will help. Try to avoid sleeping during the day.  Increase your activity as tolerated. Regular exercise will help you to sleep better and improve your mental health. Eating a heart healthy diet is recommended. Try to avoid salty or fried foods. Be sure to avoid all alcohol and illegal drugs.    Increase activity slowly  As directed         Medication List    STOP taking these medications       aspirin EC 81 MG tablet     risperiDONE 0.5 MG tablet  Commonly known as:  RISPERDAL      TAKE these medications     Indication   benztropine 1 MG tablet  Commonly known as:  COGENTIN  Take 1 tablet (1 mg total) by mouth daily. For EPS.   Indication:  Extrapyramidal  Reaction caused by Medications     divalproex 500 MG DR tablet  Commonly known as:  DEPAKOTE  Take 1 tablet (500 mg total) by mouth 2 (two) times daily. For mood stabilization.   Indication:  Manic Phase of Manic-Depression     FLUoxetine 20 MG tablet  Commonly known as:  PROZAC  Take 1 tablet (20 mg total) by mouth daily. For depression.   Indication:  Depression     haloperidol 2 MG tablet  Commonly known as:  HALDOL  Take 1 tablet (2 mg total) by mouth 2 (two) times daily. For psychosis.   Indication:  Psychosis     haloperidol decanoate 50 MG/ML injection  Commonly known as:  HALDOL DECANOATE  Inject 1 mL (50 mg total) into the muscle every 28 (twenty-eight) days. For psychosis.   Indication:  Psychosis     hydrOXYzine 25 MG tablet  Commonly known as:  ATARAX/VISTARIL  Take 1 tablet (25 mg total)  by mouth every 6 (six) hours as needed for anxiety.   Indication:  Anxiety Neurosis     traZODone 100 MG tablet  Commonly known as:  DESYREL  Take 1 tablet (100 mg total) by mouth at bedtime.   Indication:  Trouble Sleeping           Follow-up Information   Follow up with [facility] ACT team On 01/08/2013. (Tuesday between 8 and 9 AM for your appointment.  I gave Calvin with the ACT team your number so he could confirm.  If you have not heard from him by Monday, call him at [phone] to confirm actual time.)    Contact information:   [address], [place]  [phone]      Follow up with Possible living situation.   Contact information:   [facility]   in [place]  [address]  [phone]  Cell [phone]      Follow-up recommendations:   Activities: Resume activity as tolerated. Diet: Heart healthy low sodium diet Tests: Follow up testing will be determined by your out patient provider. Comments:  Strongly advised to avoid marijuana.  Total Discharge Time:  Greater than 30 minutes.  Signed:Johnda Billiot T. Domingos Riggi RPAC 4:17 PM 01/04/2013

## 2013-01-04 NOTE — Progress Notes (Signed)
Recreation Therapy Notes  Date: 04.18.2014 Time: 9:30am Location: [address] Day Room      Group Topic/Focus: Memory, Cognitive Training  Participation Level: Did not attend  Fama Muenchow L Shanterica Biehler, LRT/CTRS  Irva Loser L 01/04/2013 11:35 AM 

## 2013-01-04 NOTE — [facility] Suicide Risk Assessment (Signed)
[facility] INPATIENT:  Family/Significant Other Suicide Prevention Education  Suicide Prevention Education:  Education Completed; Ryu Cerreta, father, [phone] has been identified by the patient as the family member/significant other with whom the patient will be residing, and identified as the person(s) who will aid the patient in the event of a mental health crisis (suicidal ideations/suicide attempt).  With written consent from the patient, the family member/significant other has been provided the following suicide prevention education, prior to the and/or following the discharge of the patient.  The suicide prevention education provided includes the following:  Suicide risk factors  Suicide prevention and interventions  National Suicide Hotline telephone number  [facility] assessment telephone number  [facility] Emergency Assistance 911  [facility] and/or Residential Mobile Crisis Unit telephone number  Request made of family/significant other to:  Remove weapons (e.g., guns, rifles, knives), all items previously/currently identified as safety concern.    Remove drugs/medications (over-the-counter, prescriptions, illicit drugs), all items previously/currently identified as a safety concern.  The family member/significant other verbalizes understanding of the suicide prevention education information provided.  The family member/significant other agrees to remove the items of safety concern listed above. [name] states there are no guns in the home.  Daryel Gerald B 01/04/2013, 10:07 AM

## 2013-01-04 NOTE — [facility] Suicide Risk Assessment (Signed)
Suicide Risk Assessment  Discharge Assessment     Demographic Factors:  Adolescent or young adult and Caucasian  Mental Status Per Nursing Assessment::   On Admission:  Self-harm thoughts;Self-harm behaviors  Current Mental Status by Physician: In full contact with reality. There are no suicidal ideas, plans or intent. His mood is euthymic. His affect is appropriate. He feels ready to be discharged home   Loss Factors: NA  Historical Factors: NA  Risk Reduction Factors:   Living with another person, especially a relative and Positive social support  Continued Clinical Symptoms:  Alcohol/Substance Abuse/Dependencies, Schizoaffective Disorder  Cognitive Features That Contribute To Risk:  Closed-mindedness Thought constriction (tunnel vision)    Suicide Risk:  Minimal: No identifiable suicidal ideation.  Patients presenting with no risk factors but with morbid ruminations; may be classified as minimal risk based on the severity of the depressive symptoms  Discharge Diagnoses:   AXIS I:  Schizoaffective Disorder, Marijuana absue AXIS II:  Deferred AXIS III:   Past Medical History  Diagnosis Date  . Schizophrenia   . Hypertension   . High cholesterol   . Atrial fibrillation    AXIS IV:  other psychosocial or environmental problems AXIS V:  61-70 mild symptoms  Plan Of Care/Follow-up recommendations:  Activity:  as tolerated Diet:  regular Follow up outpatient basis Is patient on multiple antipsychotic therapies at discharge:  No   Has Patient had three or more failed trials of antipsychotic monotherapy by history:  No  Recommended Plan for Multiple Antipsychotic Therapies: N/A   [name] A 01/04/2013, 2:53 PM

## 2013-01-07 NOTE — Discharge Summary (Signed)
Seen and agreed. Anil Havard, MD 

## 2013-01-09 NOTE — Progress Notes (Signed)
Patient Discharge Instructions:  After Visit Summary (AVS):   Faxed to:  01/09/13 Discharge Summary Note:   Faxed to:  01/09/13 Psychiatric Admission Assessment Note:   Faxed to:  01/09/13 Suicide Risk Assessment - Discharge Assessment:   Faxed to:  01/09/13 Faxed/Sent to the Next Level Care provider:  01/09/13 Faxed to [facility] @ [phone]  Jerelene Redden, 01/09/2013, 4:09 PM

## 2013-01-26 ENCOUNTER — Encounter (HOSPITAL_COMMUNITY): Payer: Self-pay | Admitting: *Deleted

## 2013-01-26 ENCOUNTER — Emergency Department (HOSPITAL_COMMUNITY)
Admission: EM | Admit: 2013-01-26 | Discharge: 2013-01-28 | Disposition: A | Discharge status: Other Acute Inpatient Hospital | Payer: Federal, State, Local not specified - PPO | Attending: Emergency Medicine | Admitting: Emergency Medicine

## 2013-01-26 DIAGNOSIS — Z8679 Personal history of other diseases of the circulatory system: Secondary | ICD-10-CM | POA: Insufficient documentation

## 2013-01-26 DIAGNOSIS — Z9114 Patient's other noncompliance with medication regimen: Secondary | ICD-10-CM

## 2013-01-26 DIAGNOSIS — F1210 Cannabis abuse, uncomplicated: Secondary | ICD-10-CM | POA: Insufficient documentation

## 2013-01-26 DIAGNOSIS — R443 Hallucinations, unspecified: Secondary | ICD-10-CM | POA: Insufficient documentation

## 2013-01-26 DIAGNOSIS — I10 Essential (primary) hypertension: Secondary | ICD-10-CM | POA: Insufficient documentation

## 2013-01-26 DIAGNOSIS — F209 Schizophrenia, unspecified: Secondary | ICD-10-CM

## 2013-01-26 DIAGNOSIS — Z8639 Personal history of other endocrine, nutritional and metabolic disease: Secondary | ICD-10-CM | POA: Insufficient documentation

## 2013-01-26 DIAGNOSIS — Z79899 Other long term (current) drug therapy: Secondary | ICD-10-CM | POA: Insufficient documentation

## 2013-01-26 DIAGNOSIS — Z862 Personal history of diseases of the blood and blood-forming organs and certain disorders involving the immune mechanism: Secondary | ICD-10-CM | POA: Insufficient documentation

## 2013-01-26 DIAGNOSIS — Z9119 Patient's noncompliance with other medical treatment and regimen: Secondary | ICD-10-CM | POA: Insufficient documentation

## 2013-01-26 DIAGNOSIS — Z91199 Patient's noncompliance with other medical treatment and regimen due to unspecified reason: Secondary | ICD-10-CM | POA: Insufficient documentation

## 2013-01-26 LAB — CBC WITH DIFFERENTIAL/PLATELET
Eosinophils Absolute: 0.1 10*3/uL (ref 0.0–0.7)
HCT: 46.4 % (ref 39.0–52.0)
Hemoglobin: 16.7 g/dL (ref 13.0–17.0)
Lymphs Abs: 1.7 10*3/uL (ref 0.7–4.0)
MCH: 32 pg (ref 26.0–34.0)
Monocytes Absolute: 1 10*3/uL (ref 0.1–1.0)
Monocytes Relative: 10 % (ref 3–12)
Neutrophils Relative %: 72 % (ref 43–77)
RBC: 5.22 MIL/uL (ref 4.22–5.81)

## 2013-01-26 LAB — RAPID URINE DRUG SCREEN, HOSP PERFORMED
Amphetamines: NOT DETECTED
Barbiturates: NOT DETECTED
Benzodiazepines: NOT DETECTED

## 2013-01-26 LAB — BASIC METABOLIC PANEL
BUN: 9 mg/dL (ref 6–23)
Chloride: 104 mEq/L (ref 96–112)
Creatinine, Ser: 0.78 mg/dL (ref 0.50–1.35)
GFR calc non Af Amer: >90 mL/min (ref >90)
Glucose, Bld: 101 mg/dL — ABNORMAL HIGH (ref 70–99)
Potassium: 3.8 mEq/L (ref 3.5–5.1)

## 2013-01-26 MED ORDER — TRAZODONE HCL 50 MG PO TABS
100.0000 mg | ORAL_TABLET | Freq: Every day | ORAL | Status: DC
  Administered 2013-01-26 – 2013-01-28 (×3): 100 mg via ORAL
  Filled 2013-01-26: qty 1
  Filled 2013-01-26: qty 2
  Filled 2013-01-26 (×2): qty 1
  Filled 2013-01-26: qty 2

## 2013-01-26 MED ORDER — LORAZEPAM 1 MG PO TABS: 0.5000 mg | ORAL_TABLET | Freq: Two times a day (BID) | ORAL | Status: DC | PRN

## 2013-01-26 MED ORDER — FLUOXETINE HCL 20 MG PO CAPS
20.0000 mg | ORAL_CAPSULE | Freq: Every day | ORAL | Status: DC
  Administered 2013-01-26 – 2013-01-28 (×3): 20 mg via ORAL
  Filled 2013-01-26 (×7): qty 1

## 2013-01-26 MED ORDER — DIVALPROEX SODIUM 250 MG PO DR TAB
500.0000 mg | DELAYED_RELEASE_TABLET | Freq: Two times a day (BID) | ORAL | Status: DC
  Administered 2013-01-26 – 2013-01-28 (×4): 500 mg via ORAL
  Filled 2013-01-26 (×5): qty 2

## 2013-01-26 MED ORDER — FLUOXETINE HCL 20 MG PO CAPS
ORAL_CAPSULE | ORAL | Status: AC
  Filled 2013-01-26: qty 1

## 2013-01-26 MED ORDER — ZOLPIDEM TARTRATE 5 MG PO TABS: 10.0000 mg | ORAL_TABLET | Freq: Every evening | ORAL | Status: DC | PRN

## 2013-01-26 MED ORDER — ALUM & MAG HYDROXIDE-SIMETH 200-200-20 MG/5ML PO SUSP: 30.0000 mL | ORAL | Status: DC | PRN

## 2013-01-26 MED ORDER — HALOPERIDOL 2 MG PO TABS
ORAL_TABLET | ORAL | Status: AC
  Filled 2013-01-26: qty 1

## 2013-01-26 MED ORDER — IBUPROFEN 400 MG PO TABS: 600.0000 mg | ORAL_TABLET | Freq: Three times a day (TID) | ORAL | Status: DC | PRN

## 2013-01-26 MED ORDER — TRAZODONE HCL 50 MG PO TABS
ORAL_TABLET | ORAL | Status: AC
  Filled 2013-01-26: qty 2

## 2013-01-26 MED ORDER — ACETAMINOPHEN 325 MG PO TABS: 650.0000 mg | ORAL_TABLET | ORAL | Status: DC | PRN

## 2013-01-26 MED ORDER — NICOTINE 21 MG/24HR TD PT24
21.0000 mg | MEDICATED_PATCH | Freq: Every day | TRANSDERMAL | Status: DC
  Administered 2013-01-27: 21 mg via TRANSDERMAL
  Filled 2013-01-26: qty 1

## 2013-01-26 MED ORDER — BENZTROPINE MESYLATE 1 MG PO TABS
1.0000 mg | ORAL_TABLET | Freq: Every day | ORAL | Status: DC
  Administered 2013-01-26 – 2013-01-28 (×3): 1 mg via ORAL
  Filled 2013-01-26 (×3): qty 1

## 2013-01-26 MED ORDER — ONDANSETRON HCL 4 MG PO TABS: 4.0000 mg | ORAL_TABLET | Freq: Three times a day (TID) | ORAL | Status: DC | PRN

## 2013-01-26 MED ORDER — HALOPERIDOL 2 MG PO TABS
2.0000 mg | ORAL_TABLET | Freq: Two times a day (BID) | ORAL | Status: DC
  Administered 2013-01-26 – 2013-01-28 (×5): 2 mg via ORAL
  Filled 2013-01-26 (×11): qty 1

## 2013-01-26 NOTE — ED Notes (Signed)
Pt eating meal tray 

## 2013-01-26 NOTE — ED Notes (Signed)
Awaiting prozac from pharmacy

## 2013-01-26 NOTE — ED Notes (Signed)
MD at bedside. 

## 2013-01-26 NOTE — BH Assessment (Signed)
[facility] Assessment Progress Note  Pt reviewed with Orson Aloe, MD at 15:50.  Dr Dan Humphreys does not believe that pt either requires or would benefit from psychiatric hospitalization at this time, and is therefore declining him for admission to [facility].  At the request of Hattie Perch, Assessment Counselor, I called [facility] and spoke to the pt's nurse, Enid Derry, RN to report the results.  This was accomplished at 15:54.  Doylene Canning, MA Assessment Counselor 01/26/2013 @ 15:58
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
## 2013-01-26 NOTE — ED Notes (Signed)
Pt awakened for HS meds.

## 2013-01-26 NOTE — Progress Notes (Addendum)
Spoke with pt's nurse  Neysa Bonito who reported when she tried to d/c pt he reported he was going to go outside and walk into traffic to kill himself as he could not go back home because his dad told him so. He reports he has no where to go.  Spoke with pt who confirmed his suicidal thoughts and plan. He no longer spoke of hearing voices.  Pt is unable to contract for safety.
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
## 2013-01-26 NOTE — ED Notes (Signed)
Pt states his dad brought him here for "psychosis". Pt states he has schizophrenia and has missed a few days of medication. Pt denies SI/HI
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
## 2013-01-27 MED ORDER — NICOTINE 21 MG/24HR TD PT24
MEDICATED_PATCH | TRANSDERMAL | Status: AC
  Filled 2013-01-27: qty 1

## 2013-01-27 NOTE — ED Notes (Signed)
Pt alert and cooperative.  Denies needs at present time.  Pt continues to express desire to be discharged. Reinforced with pt that Samson Frederic with ACT team is to be in department tonight to reevaluate.

## 2013-01-27 NOTE — ED Notes (Signed)
Patient walked to restroom and returned to room. Resting on stretcher at this time. Patient asked for juice, was given apple juice.

## 2013-01-27 NOTE — ED Notes (Signed)
Pt states he is no longer suicidal and would like to leave. Asked pt about where he planned to go when discharged. Pt states, "I guess I'll call my dad and tell him I need my wallet and some clothes. I have been on the streets before. I can handle it." Explained that [place], ACT should be here shortly to see another pt and she would talk to him at that time.

## 2013-01-27 NOTE — ED Notes (Signed)
Patient is asleep at this time. No distress noted, equal rise and fall of chest.

## 2013-01-27 NOTE — ED Notes (Signed)
Spoke with Samson Frederic, ACT. She states pt is waiting for placement and there are currently no available beds available any where. Pt has remained calm and cooperative.

## 2013-01-27 NOTE — Progress Notes (Signed)
Spoke with pt regarding wanting to be d/c.  Pt reports at one time today he was feeling better and no longer felt suicidal but the voices have come back even stronger telling him to kill himself and that is why he has his TV up so loud in the room. He is still not able to contract for safety due not being able to not obey the voices. Called [facility]. Spoke with [facility] who reports no beds.
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
## 2013-01-28 NOTE — ED Notes (Signed)
Pt awaiting tele-psych conference. TV on and ready for said conference

## 2013-01-28 NOTE — ED Notes (Signed)
Pt was given a breakfast tray 

## 2013-01-28 NOTE — ED Notes (Signed)
Pt aware that he has been referred for placement at [facility], and is agreeable to this plan.

## 2013-01-28 NOTE — ED Notes (Signed)
Pt was given a sprite. Sitter at bedside

## 2013-01-28 NOTE — ED Notes (Signed)
Pt updated to plan of care and pending transfer to [facility] via care-link,  Pt verbalized understanding.

## 2013-01-28 NOTE — ED Notes (Signed)
Pt transported to [facility] via [facility]

## 2013-04-11 ENCOUNTER — Emergency Department (HOSPITAL_COMMUNITY)
Admission: EM | Admit: 2013-04-11 | Discharge: 2013-04-12 | Disposition: A | Discharge status: Home / Self Care | Payer: Federal, State, Local not specified - PPO | Attending: Emergency Medicine | Admitting: Emergency Medicine

## 2013-04-11 ENCOUNTER — Encounter (HOSPITAL_COMMUNITY): Payer: Self-pay | Admitting: *Deleted

## 2013-04-11 DIAGNOSIS — R5383 Other fatigue: Secondary | ICD-10-CM

## 2013-04-11 DIAGNOSIS — Z79899 Other long term (current) drug therapy: Secondary | ICD-10-CM | POA: Insufficient documentation

## 2013-04-11 DIAGNOSIS — R51 Headache: Secondary | ICD-10-CM | POA: Insufficient documentation

## 2013-04-11 DIAGNOSIS — Z8639 Personal history of other endocrine, nutritional and metabolic disease: Secondary | ICD-10-CM | POA: Insufficient documentation

## 2013-04-11 DIAGNOSIS — R5381 Other malaise: Secondary | ICD-10-CM | POA: Insufficient documentation

## 2013-04-11 DIAGNOSIS — F17200 Nicotine dependence, unspecified, uncomplicated: Secondary | ICD-10-CM | POA: Insufficient documentation

## 2013-04-11 DIAGNOSIS — F209 Schizophrenia, unspecified: Secondary | ICD-10-CM | POA: Insufficient documentation

## 2013-04-11 DIAGNOSIS — Z8679 Personal history of other diseases of the circulatory system: Secondary | ICD-10-CM | POA: Insufficient documentation

## 2013-04-11 DIAGNOSIS — I10 Essential (primary) hypertension: Secondary | ICD-10-CM | POA: Insufficient documentation

## 2013-04-11 DIAGNOSIS — Z862 Personal history of diseases of the blood and blood-forming organs and certain disorders involving the immune mechanism: Secondary | ICD-10-CM | POA: Insufficient documentation

## 2013-04-11 MED ORDER — SODIUM CHLORIDE 0.9 % IV BOLUS (SEPSIS)
1000.0000 mL | Freq: Once | INTRAVENOUS | Status: AC
  Administered 2013-04-12: 1000 mL via INTRAVENOUS

## 2013-04-11 MED ORDER — MORPHINE SULFATE 4 MG/ML IJ SOLN
2.0000 mg | Freq: Once | INTRAMUSCULAR | Status: AC
  Administered 2013-04-12: 2 mg via INTRAVENOUS
  Filled 2013-04-11: qty 1

## 2013-04-11 MED ORDER — ONDANSETRON HCL 4 MG/2ML IJ SOLN
4.0000 mg | Freq: Once | INTRAMUSCULAR | Status: AC
  Administered 2013-04-12: 4 mg via INTRAVENOUS
  Filled 2013-04-11: qty 2

## 2013-04-11 MED ORDER — KETOROLAC TROMETHAMINE 30 MG/ML IJ SOLN
30.0000 mg | Freq: Once | INTRAMUSCULAR | Status: AC
  Administered 2013-04-12: 30 mg via INTRAVENOUS
  Filled 2013-04-11: qty 1
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
## 2013-04-12 LAB — CBC WITH DIFFERENTIAL/PLATELET
Basophils Absolute: 0 10*3/uL (ref 0.0–0.1)
Basophils Relative: 0 % (ref 0–1)
Eosinophils Absolute: 0 10*3/uL (ref 0.0–0.7)
Hemoglobin: 16.8 g/dL (ref 13.0–17.0)
Lymphocytes Relative: 16 % (ref 12–46)
MCH: 32.2 pg (ref 26.0–34.0)
MCHC: 35.6 g/dL (ref 30.0–36.0)
Monocytes Absolute: 1.5 10*3/uL — ABNORMAL HIGH (ref 0.1–1.0)
Neutro Abs: 9.8 10*3/uL — ABNORMAL HIGH (ref 1.7–7.7)
Neutrophils Relative %: 73 % (ref 43–77)
RDW: 12.5 % (ref 11.5–15.5)

## 2013-04-12 LAB — BASIC METABOLIC PANEL
BUN: 15 mg/dL (ref 6–23)
Calcium: 10.1 mg/dL (ref 8.4–10.5)
GFR calc Af Amer: >90 mL/min (ref >90)
GFR calc non Af Amer: >90 mL/min (ref >90)
Glucose, Bld: 103 mg/dL — ABNORMAL HIGH (ref 70–99)
Sodium: 137 mEq/L (ref 135–145)

## 2013-04-12 LAB — CK: Total CK: 188 U/L (ref 7–232)

## 2013-04-12 NOTE — ED Notes (Signed)
Awakened again to recheck vital signs.  Has been sleeping at intervals.

## 2013-04-12 NOTE — ED Notes (Signed)
Lights out, pt drinking ginger ale, IV infusing without difficulty.
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
## 2013-05-30 ENCOUNTER — Encounter (HOSPITAL_COMMUNITY): Payer: Self-pay

## 2013-05-30 ENCOUNTER — Emergency Department (HOSPITAL_COMMUNITY)
Admission: EM | Admit: 2013-05-30 | Discharge: 2013-05-31 | Disposition: A | Discharge status: Home / Self Care | Payer: Federal, State, Local not specified - PPO | Attending: Emergency Medicine | Admitting: Emergency Medicine

## 2013-05-30 DIAGNOSIS — F209 Schizophrenia, unspecified: Secondary | ICD-10-CM | POA: Insufficient documentation

## 2013-05-30 DIAGNOSIS — Z8679 Personal history of other diseases of the circulatory system: Secondary | ICD-10-CM | POA: Insufficient documentation

## 2013-05-30 DIAGNOSIS — Z79899 Other long term (current) drug therapy: Secondary | ICD-10-CM | POA: Insufficient documentation

## 2013-05-30 DIAGNOSIS — E669 Obesity, unspecified: Secondary | ICD-10-CM | POA: Insufficient documentation

## 2013-05-30 DIAGNOSIS — F17200 Nicotine dependence, unspecified, uncomplicated: Secondary | ICD-10-CM | POA: Insufficient documentation

## 2013-05-30 DIAGNOSIS — Z8639 Personal history of other endocrine, nutritional and metabolic disease: Secondary | ICD-10-CM | POA: Insufficient documentation

## 2013-05-30 DIAGNOSIS — I10 Essential (primary) hypertension: Secondary | ICD-10-CM | POA: Insufficient documentation

## 2013-05-30 DIAGNOSIS — Z862 Personal history of diseases of the blood and blood-forming organs and certain disorders involving the immune mechanism: Secondary | ICD-10-CM | POA: Insufficient documentation

## 2013-05-30 LAB — CBC WITH DIFFERENTIAL/PLATELET
Basophils Absolute: 0 10*3/uL (ref 0.0–0.1)
Basophils Relative: 0 % (ref 0–1)
Eosinophils Absolute: 0.2 10*3/uL (ref 0.0–0.7)
Eosinophils Relative: 2 % (ref 0–5)
HCT: 46 % (ref 39.0–52.0)
Hemoglobin: 16.5 g/dL (ref 13.0–17.0)
Lymphocytes Relative: 25 % (ref 12–46)
Lymphs Abs: 2.6 10*3/uL (ref 0.7–4.0)
MCH: 32.5 pg (ref 26.0–34.0)
MCHC: 35.9 g/dL (ref 30.0–36.0)
MCV: 90.7 fL (ref 78.0–100.0)
Monocytes Absolute: 1.1 10*3/uL — ABNORMAL HIGH (ref 0.1–1.0)
Monocytes Relative: 10 % (ref 3–12)
Neutro Abs: 6.6 10*3/uL (ref 1.7–7.7)
Neutrophils Relative %: 63 % (ref 43–77)
Platelets: 198 10*3/uL (ref 150–400)
RBC: 5.07 MIL/uL (ref 4.22–5.81)
RDW: 12.7 % (ref 11.5–15.5)
WBC: 10.5 10*3/uL (ref 4.0–10.5)

## 2013-05-30 LAB — BASIC METABOLIC PANEL WITH GFR
BUN: 8 mg/dL (ref 6–23)
CO2: 24 meq/L (ref 19–32)
Calcium: 9.9 mg/dL (ref 8.4–10.5)
Chloride: 104 meq/L (ref 96–112)
Creatinine, Ser: 0.73 mg/dL (ref 0.50–1.35)
GFR calc Af Amer: >90 mL/min
GFR calc non Af Amer: >90 mL/min
Glucose, Bld: 101 mg/dL — ABNORMAL HIGH (ref 70–99)
Potassium: 4.3 meq/L (ref 3.5–5.1)
Sodium: 139 meq/L (ref 135–145)

## 2013-05-30 LAB — RAPID URINE DRUG SCREEN, HOSP PERFORMED
Amphetamines: NOT DETECTED
Barbiturates: NOT DETECTED
Benzodiazepines: NOT DETECTED
Cocaine: NOT DETECTED
Opiates: NOT DETECTED
Tetrahydrocannabinol: POSITIVE — AB

## 2013-05-30 LAB — ETHANOL: Alcohol, Ethyl (B): <11 mg/dL (ref 0–11)

## 2013-05-30 MED ORDER — ZIPRASIDONE HCL 20 MG PO CAPS
20.0000 mg | ORAL_CAPSULE | Freq: Two times a day (BID) | ORAL | Status: DC
  Filled 2013-05-30 (×3): qty 1

## 2013-05-30 MED ORDER — ACETAMINOPHEN 325 MG PO TABS: 650.0000 mg | ORAL_TABLET | ORAL | Status: DC | PRN

## 2013-05-30 MED ORDER — RISPERIDONE 1 MG PO TABS
2.0000 mg | ORAL_TABLET | Freq: Every day | ORAL | Status: DC
  Administered 2013-05-31: 2 mg via ORAL
  Filled 2013-05-30 (×2): qty 2

## 2013-05-30 MED ORDER — ZIPRASIDONE HCL 20 MG PO CAPS
ORAL_CAPSULE | ORAL | Status: AC
  Filled 2013-05-30: qty 1

## 2013-05-30 MED ORDER — NICOTINE 21 MG/24HR TD PT24: 21.0000 mg | MEDICATED_PATCH | Freq: Every day | TRANSDERMAL | Status: DC

## 2013-05-30 MED ORDER — ZIPRASIDONE HCL 20 MG PO CAPS
20.0000 mg | ORAL_CAPSULE | Freq: Two times a day (BID) | ORAL | Status: DC
  Administered 2013-05-30: 20 mg via ORAL
  Filled 2013-05-30 (×3): qty 1

## 2013-05-30 MED ORDER — ONDANSETRON HCL 4 MG PO TABS: 4.0000 mg | ORAL_TABLET | Freq: Three times a day (TID) | ORAL | Status: DC | PRN

## 2013-05-30 MED ORDER — LORAZEPAM 1 MG PO TABS: 1.0000 mg | ORAL_TABLET | Freq: Three times a day (TID) | ORAL | Status: DC | PRN

## 2013-05-30 NOTE — ED Notes (Signed)
Security wanding pt in triage x 1.

## 2013-05-30 NOTE — ED Notes (Signed)
Called BH to check on telepsych and they changed the appt to  2330

## 2013-05-30 NOTE — ED Provider Notes (Signed)
CSN: [id number]     Arrival date & time 05/30/13  1612 History   First MD Initiated Contact with Patient 05/30/13 1631     Chief Complaint  Patient presents with  . V70.1   (Consider location/radiation/quality/duration/timing/severity/associated sxs/prior Treatment) HPI 22 year old male with a history of schizophrenia who presents today with worsening of hearing voices. He has an ongoing history of hearing voices but they have had an increase in frequency and telling him to harm himself. Patient has been having his medications adjusted by his psychiatrist. He was seen by his psychiatrist today. The active either been spoke with him or giving him a ride home and felt that he was more likely to harm self and brought him to the ED for hydration. The patient doesn't dorsal to voices sometimes telling him to harm himself. He has had some times but has been noncompliant with his medications and he has been away from the group home states he has been taking them regularly recently. His psychiatrist is Dr. Georges Lynch. He denies any headache, neck pain, chest pain, lightheadedness, or weakness. He states that he has attempted to cut himself before because of the voices. This has not occurred today. Past Medical History  Diagnosis Date  . Schizophrenia   . Hypertension   . High cholesterol   . Atrial fibrillation    Past Surgical History  Procedure Laterality Date  . Cardiac surgery     No family history on file. History  Substance Use Topics  . Smoking status: Current Every Day Smoker -- 1.00 packs/day  . Smokeless tobacco: Not on file  . Alcohol Use: No    Review of Systems  All other systems reviewed and are negative.    Allergies  Review of patient's allergies indicates no known allergies.  Home Medications   Current Outpatient Rx  Name  Route  Sig  Dispense  Refill  . benztropine (COGENTIN) 1 MG tablet   Oral   Take 1 tablet (1 mg total) by mouth daily. For EPS.   30 tablet    0   . busPIRone (BUSPAR) 10 MG tablet   Oral   Take 10 mg by mouth 3 (three) times daily.         . divalproex (DEPAKOTE) 500 MG DR tablet   Oral   Take 1,500 mg by mouth at bedtime.         Marland Kitchen FLUoxetine (PROZAC) 20 MG tablet   Oral   Take 60 mg by mouth daily.         . haloperidol (HALDOL) 10 MG tablet   Oral   Take 10 mg by mouth 2 (two) times daily.         . hydrOXYzine (ATARAX/VISTARIL) 50 MG tablet   Oral   Take 50 mg by mouth 3 (three) times daily as needed for anxiety.         Marland Kitchen levothyroxine (SYNTHROID, LEVOTHROID) 25 MCG tablet   Oral   Take 25 mcg by mouth daily before breakfast.         . QUEtiapine (SEROQUEL) 50 MG tablet   Oral   Take 50 mg by mouth 3 (three) times daily.         . traZODone (DESYREL) 150 MG tablet   Oral   Take 150 mg by mouth at bedtime.         . haloperidol decanoate (HALDOL DECANOATE) 50 MG/ML injection   Intramuscular   Inject 1 mL (50 mg total) into  the muscle every 28 (twenty-eight) days. For psychosis.   1 mL   0     Last dose given 01/03/2013. Next dose due 14 days.    BP 143/89  Pulse 82  Resp 20  Ht 5\' 10"  (1.778 m)  Wt 285 lb (129.275 kg)  BMI 40.89 kg/m2  SpO2 97% Physical Exam  Nursing note and vitals reviewed. Constitutional: He is oriented to person, place, and time. He appears well-developed and well-nourished.  Obese  HENT:  Head: Normocephalic and atraumatic.  Right Ear: External ear normal.  Left Ear: External ear normal.  Nose: Nose normal.  Mouth/Throat: Oropharynx is clear and moist.  Eyes: Conjunctivae and EOM are normal. Pupils are equal, round, and reactive to light.  Neck: Normal range of motion. Neck supple.  Cardiovascular: Normal rate and normal heart sounds.   Pulmonary/Chest: Effort normal and breath sounds normal.  Abdominal: Soft. Bowel sounds are normal.  Musculoskeletal: Normal range of motion.  Neurological: He is alert and oriented to person, place, and time. He has  normal reflexes.  Skin: Skin is warm.  Psychiatric: He has a normal mood and affect. His speech is normal and behavior is normal. Thought content normal. Cognition and memory are normal.  Patient reports that he hears voices in his head and that they tell him to harm self.    ED Course  Procedures (including critical care time) Labs Review Labs Reviewed  CBC WITH DIFFERENTIAL - Abnormal; Notable for the following:    Monocytes Absolute 1.1 (*)    All other components within normal limits  BASIC METABOLIC PANEL - Abnormal; Notable for the following:    Glucose, Bld 101 (*)    All other components within normal limits  URINE RAPID DRUG SCREEN (HOSP PERFORMED) - Abnormal; Notable for the following:    Tetrahydrocannabinol POSITIVE (*)    All other components within normal limits  ETHANOL  VALPROIC ACID LEVEL   Imaging Review No results found.  MDM  No diagnosis found. Patient reported worsening voices and was somewhat agitated and received Geodon 20 mg by mouth and has been resting comfortably. The patient has had a CTS consult was placed and it is currently pending. Patient has a psychiatric holding orders placed. I discussed his care with Dr. Judd Lien at his side.him remain stable.    Hilario Quarry, MD 05/30/13 2204

## 2013-05-30 NOTE — ED Notes (Signed)
States he is hearing voices telling him to hurt himself increasing anxiety.  Dr. Rosalia Hammers notified, order recv'd. And given for geodon 20mg  PO.

## 2013-05-30 NOTE — ED Notes (Signed)
Pt brought to ER by ACT director because pt has been hearing voices telling him to hurt himself.  Reports has been going on off and on but last time they tried to manage it at home, pt started cutting himself when the voices "got to this level."  Denies HI.

## 2013-05-30 NOTE — ED Notes (Signed)
Pt is doing telepsych at this time.

## 2013-05-30 NOTE — ED Notes (Signed)
CAlled for TTS consult.  Appt time of 8:30 pm given.  Nurse informed.

## 2013-05-30 NOTE — ED Notes (Signed)
States anxiety is better.  Lying on stretcher, resting.

## 2013-05-30 NOTE — ED Notes (Signed)
Catherine at [facility] called to rescedule appt.  New appt time will be 9:30 or 10:00 pm.  "They got behind".  Nurse informed.

## 2013-05-31 DIAGNOSIS — F209 Schizophrenia, unspecified: Secondary | ICD-10-CM

## 2013-05-31 DIAGNOSIS — F1210 Cannabis abuse, uncomplicated: Secondary | ICD-10-CM

## 2013-05-31 NOTE — ED Notes (Signed)
Patient given coffee at this time.
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
## 2013-05-31 NOTE — ED Notes (Signed)
Tele psych done by Dr More. She will call group home to make sure pt is able to return there and then get back in touch

## 2013-05-31 NOTE — ED Notes (Signed)
Pt states he is ready to go home. States yesterday was just a bad day for him and he feels a lot better now. EDP aware and in to speak with pt. Pt to have a repeat tele psych assessment. Pt aware

## 2013-05-31 NOTE — ED Notes (Signed)
Pt to be discharged back to group home. Some one from home will pick him up

## 2013-05-31 NOTE — ED Notes (Signed)
geodon held due to pt wanting to go home and it is not a routine med for pt

## 2013-05-31 NOTE — ED Provider Notes (Signed)
Pt seen by psyc today and it was felt he could go back to his group home and have out pt tx  Richard Lennert, MD 05/31/13 1024

## 2013-07-05 ENCOUNTER — Emergency Department (HOSPITAL_COMMUNITY)
Admission: EM | Admit: 2013-07-05 | Discharge: 2013-07-06 | Disposition: A | Discharge status: Other Acute Inpatient Hospital | Payer: Federal, State, Local not specified - PPO | Attending: Emergency Medicine | Admitting: Emergency Medicine

## 2013-07-05 ENCOUNTER — Encounter (HOSPITAL_COMMUNITY): Payer: Self-pay | Admitting: Emergency Medicine

## 2013-07-05 DIAGNOSIS — F1220 Cannabis dependence, uncomplicated: Secondary | ICD-10-CM

## 2013-07-05 DIAGNOSIS — F209 Schizophrenia, unspecified: Secondary | ICD-10-CM | POA: Insufficient documentation

## 2013-07-05 DIAGNOSIS — F200 Paranoid schizophrenia: Secondary | ICD-10-CM | POA: Diagnosis present

## 2013-07-05 DIAGNOSIS — F17200 Nicotine dependence, unspecified, uncomplicated: Secondary | ICD-10-CM | POA: Insufficient documentation

## 2013-07-05 DIAGNOSIS — E7800 Pure hypercholesterolemia, unspecified: Secondary | ICD-10-CM | POA: Insufficient documentation

## 2013-07-05 DIAGNOSIS — E039 Hypothyroidism, unspecified: Secondary | ICD-10-CM | POA: Insufficient documentation

## 2013-07-05 DIAGNOSIS — I10 Essential (primary) hypertension: Secondary | ICD-10-CM | POA: Insufficient documentation

## 2013-07-05 DIAGNOSIS — Z79899 Other long term (current) drug therapy: Secondary | ICD-10-CM | POA: Insufficient documentation

## 2013-07-05 HISTORY — DX: Pure hypercholesterolemia, unspecified: E78.00

## 2013-07-05 HISTORY — DX: Hypothyroidism, unspecified: E03.9

## 2013-07-05 LAB — CBC
HCT: 45.7 % (ref 39.0–52.0)
Hemoglobin: 16.1 g/dL (ref 13.0–17.0)
MCH: 32.3 pg (ref 26.0–34.0)
RBC: 4.99 MIL/uL (ref 4.22–5.81)

## 2013-07-05 LAB — RAPID URINE DRUG SCREEN, HOSP PERFORMED
Amphetamines: NOT DETECTED
Barbiturates: NOT DETECTED
Cocaine: NOT DETECTED
Opiates: NOT DETECTED
Tetrahydrocannabinol: POSITIVE — AB

## 2013-07-05 LAB — BASIC METABOLIC PANEL
BUN: 11 mg/dL (ref 6–23)
CO2: 26 mEq/L (ref 19–32)
Calcium: 9.9 mg/dL (ref 8.4–10.5)
Glucose, Bld: 110 mg/dL — ABNORMAL HIGH (ref 70–99)
Potassium: 4.3 mEq/L (ref 3.5–5.1)
Sodium: 137 mEq/L (ref 135–145)

## 2013-07-05 MED ORDER — SIMVASTATIN 20 MG PO TABS
20.0000 mg | ORAL_TABLET | Freq: Every evening | ORAL | Status: DC
  Administered 2013-07-05: 20 mg via ORAL
  Filled 2013-07-05 (×5): qty 1

## 2013-07-05 MED ORDER — ZIPRASIDONE MESYLATE 20 MG IM SOLR
INTRAMUSCULAR | Status: AC
  Administered 2013-07-05: 20 mg via INTRAMUSCULAR
  Filled 2013-07-05: qty 20

## 2013-07-05 MED ORDER — DIVALPROEX SODIUM 250 MG PO DR TAB
1500.0000 mg | DELAYED_RELEASE_TABLET | Freq: Every day | ORAL | Status: DC
  Administered 2013-07-05: 1500 mg via ORAL
  Filled 2013-07-05: qty 6

## 2013-07-05 MED ORDER — ACETAMINOPHEN 325 MG PO TABS: 650.0000 mg | ORAL_TABLET | ORAL | Status: DC | PRN

## 2013-07-05 MED ORDER — TRAZODONE HCL 50 MG PO TABS
150.0000 mg | ORAL_TABLET | Freq: Every day | ORAL | Status: DC
  Administered 2013-07-05: 150 mg via ORAL
  Filled 2013-07-05: qty 3

## 2013-07-05 MED ORDER — BENZTROPINE MESYLATE 1 MG PO TABS
1.0000 mg | ORAL_TABLET | Freq: Every day | ORAL | Status: DC
  Administered 2013-07-05 – 2013-07-06 (×2): 1 mg via ORAL
  Filled 2013-07-05 (×2): qty 1

## 2013-07-05 MED ORDER — BUSPIRONE HCL 5 MG PO TABS
ORAL_TABLET | ORAL | Status: AC
  Filled 2013-07-05: qty 2

## 2013-07-05 MED ORDER — FENOFIBRATE 160 MG PO TABS
160.0000 mg | ORAL_TABLET | Freq: Every day | ORAL | Status: DC
  Administered 2013-07-06: 160 mg via ORAL
  Filled 2013-07-05 (×4): qty 1

## 2013-07-05 MED ORDER — PROPRANOLOL HCL 10 MG PO TABS
10.0000 mg | ORAL_TABLET | Freq: Two times a day (BID) | ORAL | Status: DC
  Administered 2013-07-05 – 2013-07-06 (×2): 10 mg via ORAL
  Filled 2013-07-05 (×2): qty 1

## 2013-07-05 MED ORDER — ZIPRASIDONE HCL 20 MG PO CAPS: 20.0000 mg | ORAL_CAPSULE | Freq: Once | ORAL | Status: DC

## 2013-07-05 MED ORDER — QUETIAPINE FUMARATE 25 MG PO TABS
50.0000 mg | ORAL_TABLET | Freq: Three times a day (TID) | ORAL | Status: DC
Start: 2013-07-05 — End: 2013-07-06
  Administered 2013-07-05 – 2013-07-06 (×3): 50 mg via ORAL
  Filled 2013-07-05 (×11): qty 1

## 2013-07-05 MED ORDER — HALOPERIDOL 5 MG PO TABS
10.0000 mg | ORAL_TABLET | Freq: Two times a day (BID) | ORAL | Status: DC
  Administered 2013-07-05 – 2013-07-06 (×2): 10 mg via ORAL
  Filled 2013-07-05 (×2): qty 2

## 2013-07-05 MED ORDER — LEVOTHYROXINE SODIUM 100 MCG PO TABS
100.0000 ug | ORAL_TABLET | Freq: Every day | ORAL | Status: DC
  Administered 2013-07-06: 100 ug via ORAL
  Filled 2013-07-05 (×4): qty 1

## 2013-07-05 MED ORDER — LORAZEPAM 1 MG PO TABS: 1.0000 mg | ORAL_TABLET | Freq: Three times a day (TID) | ORAL | Status: DC | PRN

## 2013-07-05 MED ORDER — FLUOXETINE HCL 20 MG PO TABS
60.0000 mg | ORAL_TABLET | Freq: Every day | ORAL | Status: DC
  Administered 2013-07-06: 60 mg via ORAL
  Filled 2013-07-05 (×4): qty 3

## 2013-07-05 MED ORDER — QUETIAPINE FUMARATE 25 MG PO TABS
ORAL_TABLET | ORAL | Status: AC
  Filled 2013-07-05: qty 2

## 2013-07-05 MED ORDER — BUSPIRONE HCL 5 MG PO TABS
10.0000 mg | ORAL_TABLET | Freq: Three times a day (TID) | ORAL | Status: DC
  Administered 2013-07-05 – 2013-07-06 (×3): 10 mg via ORAL
  Filled 2013-07-05 (×11): qty 1

## 2013-07-05 MED ORDER — ALUM & MAG HYDROXIDE-SIMETH 200-200-20 MG/5ML PO SUSP: 30.0000 mL | ORAL | Status: DC | PRN

## 2013-07-05 MED ORDER — IBUPROFEN 400 MG PO TABS: 400.0000 mg | ORAL_TABLET | Freq: Three times a day (TID) | ORAL | Status: DC | PRN

## 2013-07-05 MED ORDER — NICOTINE 21 MG/24HR TD PT24: 21.0000 mg | MEDICATED_PATCH | Freq: Every day | TRANSDERMAL | Status: DC | PRN

## 2013-07-05 MED ORDER — ZIPRASIDONE MESYLATE 20 MG IM SOLR
20.0000 mg | Freq: Once | INTRAMUSCULAR | Status: AC
  Administered 2013-07-05: 20 mg via INTRAMUSCULAR

## 2013-07-05 NOTE — ED Notes (Signed)
Pt states the voices in his head are getting progressively worse. Denies SI/HI. States voices tell him to hurt himself. Takes medication, states has not missed doses. Not sleeping well. Eating and drinking normally. Lives in group home and sees ACT Team and psychiatrist regularly.

## 2013-07-05 NOTE — ED Notes (Signed)
Pt lying in bed, states voices are less. Ate dinner. Does not appear as anxious/agitated.

## 2013-07-05 NOTE — ED Notes (Signed)
Pt given decaf coffee to drink. Will have telepsych consult within next hour.

## 2013-07-05 NOTE — ED Provider Notes (Addendum)
CSN: [id number]     Arrival date & time 07/05/13  1605 History   First MD Initiated Contact with Patient 07/05/13 1617     Chief Complaint  Patient presents with  . Medical Clearance   HPI Pt was seen at 1625. Per pt, c/o gradual onset and worsening of persistent auditory hallucinations for the past several weeks. Pt states the "voices tell me to hurt myself." Does not have a plan. Endorses taking his meds regularly. Pt was evaluated by [facility] staff at his group home PTA, then sent to the ED for further evaluation. Denies SA, no HI.    Past Medical History  Diagnosis Date  . Schizophrenia   . Hypothyroidism   . Hypercholesterolemia   . Hypertension    Past Surgical History  Procedure Laterality Date  . Tonsillectomy    . Cardiac surgery      for Epstein's abnormality at 22yo    History  Substance Use Topics  . Smoking status: Current Every Day Smoker -- 1.00 packs/day  . Smokeless tobacco: Not on file  . Alcohol Use: No    Review of Systems ROS: Statement: All systems negative except as marked or noted in the HPI; Constitutional: Negative for fever and chills. ; ; Eyes: Negative for eye pain, redness and discharge. ; ; ENMT: Negative for ear pain, hoarseness, nasal congestion, sinus pressure and sore throat. ; ; Cardiovascular: Negative for chest pain, palpitations, diaphoresis, dyspnea and peripheral edema. ; ; Respiratory: Negative for cough, wheezing and stridor. ; ; Gastrointestinal: Negative for nausea, vomiting, diarrhea, abdominal pain, blood in stool, hematemesis, jaundice and rectal bleeding. . ; ; Genitourinary: Negative for dysuria, flank pain and hematuria. ; ; Musculoskeletal: Negative for back pain and neck pain. Negative for swelling and trauma.; ; Skin: Negative for pruritus, rash, abrasions, blisters, bruising and skin lesion.; ; Neuro: Negative for headache, lightheadedness and neck stiffness. Negative for weakness, altered level of consciousness , altered mental  status, extremity weakness, paresthesias, involuntary movement, seizure and syncope.; Psych:  No SA, no HI, +hallucinations, SI.      Allergies  Review of patient's allergies indicates no known allergies.  Home Medications   Current Outpatient Rx  Name  Route  Sig  Dispense  Refill  . benztropine (COGENTIN) 1 MG tablet   Oral   Take 1 tablet (1 mg total) by mouth daily. For EPS.   30 tablet   0   . busPIRone (BUSPAR) 10 MG tablet   Oral   Take 10 mg by mouth 3 (three) times daily.         . divalproex (DEPAKOTE) 500 MG DR tablet   Oral   Take 1,500 mg by mouth at bedtime.         . fenofibrate 160 MG tablet   Oral   Take 160 mg by mouth daily.         Marland Kitchen FLUoxetine (PROZAC) 20 MG tablet   Oral   Take 60 mg by mouth daily.         . haloperidol (HALDOL) 10 MG tablet   Oral   Take 10 mg by mouth 2 (two) times daily.         . haloperidol decanoate (HALDOL DECANOATE) 50 MG/ML injection   Intramuscular   Inject 1 mL (50 mg total) into the muscle every 28 (twenty-eight) days. For psychosis.   1 mL   0     Last dose given 01/03/2013. Next dose due 14 days.   Marland Kitchen  levothyroxine (SYNTHROID, LEVOTHROID) 25 MCG tablet   Oral   Take 100 mcg by mouth daily before breakfast.          . propranolol (INDERAL) 10 MG tablet   Oral   Take 10 mg by mouth 2 (two) times daily.         . QUEtiapine (SEROQUEL) 50 MG tablet   Oral   Take 50 mg by mouth 3 (three) times daily.         . simvastatin (ZOCOR) 20 MG tablet   Oral   Take 20 mg by mouth every evening.         . traZODone (DESYREL) 150 MG tablet   Oral   Take 150 mg by mouth at bedtime.          BP 129/80  Pulse 89  Temp(Src) 97.7 F (36.5 C) (Oral)  Resp 18  Ht 5\' 10"  (1.778 m)  Wt 285 lb (129.275 kg)  BMI 40.89 kg/m2  SpO2 98% Physical Exam 1630: Physical examination:  Nursing notes reviewed; Vital signs and O2 SAT reviewed;  Constitutional: Well developed, Well nourished, Well hydrated,  In no acute distress; Head:  Normocephalic, atraumatic; Eyes: EOMI, PERRL, No scleral icterus; ENMT: Mouth and pharynx normal, Mucous membranes moist; Neck: Supple, Full range of motion, No lymphadenopathy; Cardiovascular: Regular rate and rhythm, No murmur, rub, or gallop; Respiratory: Breath sounds clear & equal bilaterally, No rales, rhonchi, wheezes.  Speaking full sentences with ease, Normal respiratory effort/excursion; Chest: Nontender, Movement normal; Abdomen: Soft, Nontender, Nondistended, Normal bowel sounds;; Extremities: Pulses normal, No tenderness, No edema, No calf edema or asymmetry.; Neuro: AA&Ox3, Major CN grossly intact.  Speech clear. Climbs on and off stretcher easily by himself. Gait steady. No gross focal motor or sensory deficits in extremities.; Skin: Color normal, Warm, Dry.; Psych:  Affect flat, poor eye contact..    ED Course  Procedures    EKG Interpretation   None       MDM  MDM Reviewed: previous chart, nursing note and vitals Reviewed previous: labs Interpretation: labs     Results for orders placed during the hospital encounter of 07/05/13  CBC      Result Value Range   WBC 8.1  4.0 - 10.5 K/uL   RBC 4.99  4.22 - 5.81 MIL/uL   Hemoglobin 16.1  13.0 - 17.0 g/dL   HCT 32.4  40.1 - 02.7 %   MCV 91.6  78.0 - 100.0 fL   MCH 32.3  26.0 - 34.0 pg   MCHC 35.2  30.0 - 36.0 g/dL   RDW 25.3  66.4 - 40.3 %   Platelets 203  150 - 400 K/uL  BASIC METABOLIC PANEL      Result Value Range   Sodium 137  135 - 145 mEq/L   Potassium 4.3  3.5 - 5.1 mEq/L   Chloride 101  96 - 112 mEq/L   CO2 26  19 - 32 mEq/L   Glucose, Bld 110 (*) 70 - 99 mg/dL   BUN 11  6 - 23 mg/dL   Creatinine, Ser 4.74  0.50 - 1.35 mg/dL   Calcium 9.9  8.4 - 25.9 mg/dL   GFR calc non Af Amer >90  >90 mL/min   GFR calc Af Amer >90  >90 mL/min  ETHANOL      Result Value Range   Alcohol, Ethyl (B) <11  0 - 11 mg/dL  URINE RAPID DRUG SCREEN (HOSP PERFORMED)      Result  Value Range    Opiates NONE DETECTED  NONE DETECTED   Cocaine NONE DETECTED  NONE DETECTED   Benzodiazepines NONE DETECTED  NONE DETECTED   Amphetamines NONE DETECTED  NONE DETECTED   Tetrahydrocannabinol POSITIVE (*) NONE DETECTED   Barbiturates NONE DETECTED  NONE DETECTED     1730:  Pt becoming more agitated, pacing the room, getting in verbal altercation with other pt's.  Pt states the voices are getting worse. Will dose geodon.   1830:  Pt now calmer after IM geodon, states the voices are less. Pt ate dinner.  Psych team eval pending.   2115:  TSS evaluated pt: recommends admission. Holding orders written.   Laray Anger, DO 07/05/13 2120  Laray Anger, DO 07/05/13 2122

## 2013-07-05 NOTE — ED Notes (Signed)
[name] with [facility] called to set a TTS  appt. up for Pt at 8pm.  Nurse informed.

## 2013-07-05 NOTE — ED Notes (Addendum)
Pt says he is hearing voices telling him to hurt himself.  Brought to ER by [facility] employee that was checking on him at his group home.  Wanded at triage.

## 2013-07-05 NOTE — BH Assessment (Signed)
Tele Assessment Note   [name] is an 22 y.o. male. Patient was brought to the emergency department by his family care home due to increased anxiety and hearing voices which were telling him to harm himself. He was seen by the ACT Team RN at [facility] today, who provided him with his injectable medications(Haldol). Because he stated he was hearing voices which were telling him to harm himself, the RN recommended he come to the ED for further evaluation. He was last seen by his psychiatrist on October 9, and did have some medication changes, but he is not able to determine what was changed or why. He states the voices have not told him specifically how to harm himself, but they are getting louder. He states he also is seeing "shadows" out of the corner of his eye and that just in the past few days. He states that he did smoke cannabis 2 days ago, stating he smoked one joint. He denies any other drugs or alcohol abuse. Patient reports that he is eating well, but he is waking up frequently at night, getting about 5 hours of sleep total. He reports he is active, going to the [facility] on Mondays, participating in Group on Tuesday and participating in SA group on Wednesdays. He has a history of cutting on himself, and last cut on himself 3 months ago. He also has a past history of suicide attempts, overdosing on Klonodine which required him to be hospitalized. He has a family history of mental illness, including his mother, who has schizophrenia and bipolar disorder, and a sister with depression and Borderline Personality Disorder. He currently  Lives at [facility] and can return to the home, however, he doesn't feel he can safely remain there and feels he may harm himself as the voices are telling him to do so. At this time, he can not contract for safety.   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 Schizophrenia   . Hypertension   . High cholesterol   . Atrial fibrillation     Past Surgical History  Procedure Laterality Date  . Cardiac surgery    . Tonsillectomy      Family History: History reviewed. No pertinent family history.  Social History:  reports that he has been smoking.  He does not have any smokeless tobacco history on file. He reports that he uses illicit drugs (Marijuana). He reports that he does not drink alcohol.  Additional Social History:     CIWA: CIWA-Ar BP: 129/80 mmHg Pulse Rate: 89 COWS:    Allergies: No Known Allergies  Home Medications:  (Not in a hospital admission)  OB/GYN Status:  No LMP for male patient.  General Assessment Data Location of Assessment: [facility] ACT Assessment: Yes Is this a Tele or Face-to-Face Assessment?: Tele Assessment Is this an Initial Assessment or a Re-assessment for this encounter?: Initial Assessment Living Arrangements: Other (Comment) Can pt return to current living arrangement?:  (family care home) Admission Status: Voluntary Is patient capable of signing voluntary admission?: Yes Transfer from: Acute Hospital Referral Source: MD     [facility] Crisis Care Plan Living Arrangements: Other (Comment) Name of Psychiatrist:  (Dr. Rosalia Hammers) Name of Therapist:  Floydene Flock ACTT)  Education Status Is patient currently in school?: No  Risk to self  Suicidal Ideation: Yes-Currently Present Suicidal Intent: Yes-Currently Present Is patient at risk for suicide?: Yes Suicidal Plan?: No-Not Currently/Within Last 6 Months Access to Means: No What has been your use of drugs/alcohol within the last 12 months?: THC Previous Attempts/Gestures: Yes How many times?: 1 Other Self Harm Risks: Cutiing/past hx of OD Triggers for Past Attempts: Hallucinations Intentional Self Injurious Behavior:  Cutting Comment - Self Injurious Behavior:  (3 months ago) Family Suicide History: Yes Recent stressful life event(s): Conflict (Comment) Depression: Yes Depression Symptoms: Insomnia;Loss of interest in usual pleasures Substance abuse history and/or treatment for substance abuse?: Yes Suicide prevention information given to non-admitted patients: Not applicable  Risk to Others Homicidal Ideation: No Thoughts of Harm to Others: No Current Homicidal Intent: No Current Homicidal Plan: No Access to Homicidal Means: No History of harm to others?: No Assessment of Violence: None Noted Violent Behavior Description: patient is appropriate Does patient have access to weapons?: No Criminal Charges Pending?: No Does patient have a court date: No  Psychosis Hallucinations: Auditory;With command Delusions: None noted  Mental Status Report Appear/Hygiene: Improved Eye Contact: Fair Motor Activity: Freedom of movement;Restlessness Speech: Logical/coherent;Pressured Level of Consciousness: Alert;Restless Mood: Anxious Affect: Preoccupied Anxiety Level: Moderate Thought Processes: Coherent Judgement: Unimpaired Orientation: Person;Place;Time;Situation;Appropriate for developmental age Obsessive Compulsive Thoughts/Behaviors: None  Cognitive Functioning Concentration: Decreased Memory: Recent Intact;Remote Intact IQ: Average Insight: Fair Impulse Control: Fair Appetite: Fair Sleep: Decreased Total Hours of Sleep: 5 Vegetative Symptoms: None  ADLScreening [facility] Assessment Services) Patient's cognitive ability adequate to safely complete daily activities?: Yes Patient able to express need for assistance with ADLs?: Yes Independently performs ADLs?: Yes (appropriate for developmental age)  Prior Inpatient Therapy Prior Inpatient Therapy: Yes Prior Therapy Dates:  (April 2014, June 2014 [facility], [facility], [place], [place], ) Prior Therapy Facilty/Provider(s):  ([facility], [facility], [facility],  [place]) Reason for Treatment:  (OD, Substance Abuse, suicidal, command hallucinations. )  Prior Outpatient Therapy Prior Outpatient Therapy: Yes Prior Therapy Dates:  (current) Prior Therapy Facilty/Provider(s):  ([facility]) Reason for Treatment:  (outpatient, med management, SA)  ADL Screening (condition at time of admission) Patient's cognitive ability adequate to safely complete daily activities?: Yes Patient able to express need for assistance with ADLs?: Yes Independently performs ADLs?: Yes (appropriate for developmental age)         Values / Beliefs Cultural Requests During Hospitalization: None Spiritual Requests During Hospitalization: None        Additional Information 1:1 In Past 12 Months?: No CIRT Risk: No Elopement Risk: No Does patient have medical clearance?: Yes     Disposition:  Disposition Initial Assessment Completed for this Encounter: Yes Disposition of Patient: Inpatient treatment program Type of inpatient treatment program: Adult  Recommendation for inpatient hospitalization.  Shon Baton H 07/05/2013 8:37 PM

## 2013-07-05 NOTE — ED Notes (Signed)
Appt. Time for consult is about 1730.

## 2013-07-05 NOTE — ED Notes (Signed)
Felisa called and stated patient has been accepted to [facility] but is waiting on a bed on the 400 unit. Patient being admitted by Dr Lolly Mustache.

## 2013-07-05 NOTE — BH Assessment (Signed)
[facility] Assessment Progress Note     PAtient was accepted by Maryjean Morn, PA to Dr. Lolly Mustache; to 400 hall. AC is determining bed assignment and will contact [facility] when a bed is available.  Shon Baton, [name], [name], LCASA, CSW-G

## 2013-07-05 NOTE — ED Notes (Signed)
Pt in room pacing, will sit in chair, rocking back and forth. Pt becoming increasingly more agitated. Pt began to cuss in room and began to have verbal altercation with pt next door. Pt states the voices have increased. MD made aware and orders received for medication. Pt took IM injection without problem. No further needs.

## 2013-07-05 NOTE — ED Notes (Addendum)
Patient given saltine crackers, peanut butter and decaf coffee.

## 2013-07-06 ENCOUNTER — Inpatient Hospital Stay (HOSPITAL_COMMUNITY)
Admission: AD | Admit: 2013-07-06 | Discharge: 2013-07-12 | DRG: 885 | Disposition: A | Discharge status: Home / Self Care | Payer: Federal, State, Local not specified - PPO | Source: Intra-hospital | Attending: Psychiatry | Admitting: Psychiatry

## 2013-07-06 ENCOUNTER — Encounter (HOSPITAL_COMMUNITY): Payer: Self-pay

## 2013-07-06 DIAGNOSIS — F200 Paranoid schizophrenia: Principal | ICD-10-CM

## 2013-07-06 DIAGNOSIS — R45851 Suicidal ideations: Secondary | ICD-10-CM

## 2013-07-06 DIAGNOSIS — Z79899 Other long term (current) drug therapy: Secondary | ICD-10-CM

## 2013-07-06 DIAGNOSIS — F1210 Cannabis abuse, uncomplicated: Secondary | ICD-10-CM | POA: Diagnosis present

## 2013-07-06 DIAGNOSIS — E039 Hypothyroidism, unspecified: Secondary | ICD-10-CM | POA: Diagnosis present

## 2013-07-06 DIAGNOSIS — E7800 Pure hypercholesterolemia, unspecified: Secondary | ICD-10-CM | POA: Diagnosis present

## 2013-07-06 DIAGNOSIS — I10 Essential (primary) hypertension: Secondary | ICD-10-CM | POA: Diagnosis present

## 2013-07-06 MED ORDER — LEVOTHYROXINE SODIUM 100 MCG PO TABS
100.0000 ug | ORAL_TABLET | Freq: Every day | ORAL | Status: DC
  Administered 2013-07-07 – 2013-07-12 (×6): 100 ug via ORAL
  Filled 2013-07-06 (×9): qty 1

## 2013-07-06 MED ORDER — DIVALPROEX SODIUM 500 MG PO DR TAB
1500.0000 mg | DELAYED_RELEASE_TABLET | Freq: Every day | ORAL | Status: DC
  Administered 2013-07-06 – 2013-07-09 (×4): 1500 mg via ORAL
  Filled 2013-07-06 (×6): qty 3

## 2013-07-06 MED ORDER — INFLUENZA VAC SPLIT QUAD 0.5 ML IM SUSP
0.5000 mL | INTRAMUSCULAR | Status: AC
  Administered 2013-07-07: 0.5 mL via INTRAMUSCULAR
  Filled 2013-07-06: qty 0.5

## 2013-07-06 MED ORDER — PNEUMOCOCCAL VAC POLYVALENT 25 MCG/0.5ML IJ INJ
0.5000 mL | INJECTION | INTRAMUSCULAR | Status: AC
  Administered 2013-07-07: 0.5 mL via INTRAMUSCULAR

## 2013-07-06 MED ORDER — TRAZODONE HCL 150 MG PO TABS
150.0000 mg | ORAL_TABLET | Freq: Every day | ORAL | Status: DC
  Administered 2013-07-06 – 2013-07-08 (×3): 150 mg via ORAL
  Filled 2013-07-06 (×5): qty 1
  Filled 2013-07-06: qty 3

## 2013-07-06 MED ORDER — IBUPROFEN 200 MG PO TABS: 400.0000 mg | ORAL_TABLET | Freq: Three times a day (TID) | ORAL | Status: DC | PRN

## 2013-07-06 MED ORDER — ALUM & MAG HYDROXIDE-SIMETH 200-200-20 MG/5ML PO SUSP: 30.0000 mL | ORAL | Status: DC | PRN

## 2013-07-06 MED ORDER — LORAZEPAM 1 MG PO TABS: 1.0000 mg | ORAL_TABLET | Freq: Three times a day (TID) | ORAL | Status: DC | PRN

## 2013-07-06 MED ORDER — PROPRANOLOL HCL 10 MG PO TABS
10.0000 mg | ORAL_TABLET | Freq: Two times a day (BID) | ORAL | Status: DC
  Administered 2013-07-06 – 2013-07-12 (×12): 10 mg via ORAL
  Filled 2013-07-06 (×18): qty 1

## 2013-07-06 MED ORDER — FLUOXETINE HCL 20 MG PO TABS
60.0000 mg | ORAL_TABLET | Freq: Every day | ORAL | Status: DC
  Administered 2013-07-07 – 2013-07-12 (×6): 60 mg via ORAL
  Filled 2013-07-06 (×5): qty 3
  Filled 2013-07-06: qty 6
  Filled 2013-07-06 (×2): qty 3

## 2013-07-06 MED ORDER — QUETIAPINE FUMARATE 50 MG PO TABS
50.0000 mg | ORAL_TABLET | Freq: Three times a day (TID) | ORAL | Status: DC
  Administered 2013-07-07: 50 mg via ORAL
  Filled 2013-07-06 (×4): qty 1

## 2013-07-06 MED ORDER — BENZTROPINE MESYLATE 1 MG PO TABS
1.0000 mg | ORAL_TABLET | Freq: Every day | ORAL | Status: DC
  Administered 2013-07-07 – 2013-07-10 (×4): 1 mg via ORAL
  Filled 2013-07-06 (×5): qty 1

## 2013-07-06 MED ORDER — FENOFIBRATE 160 MG PO TABS
160.0000 mg | ORAL_TABLET | Freq: Every day | ORAL | Status: DC
  Administered 2013-07-07 – 2013-07-12 (×6): 160 mg via ORAL
  Filled 2013-07-06 (×8): qty 1

## 2013-07-06 MED ORDER — BUSPIRONE HCL 10 MG PO TABS
10.0000 mg | ORAL_TABLET | Freq: Three times a day (TID) | ORAL | Status: DC
  Administered 2013-07-07 – 2013-07-10 (×10): 10 mg via ORAL
  Filled 2013-07-06 (×13): qty 1

## 2013-07-06 MED ORDER — MAGNESIUM HYDROXIDE 400 MG/5ML PO SUSP: 30.0000 mL | Freq: Every day | ORAL | Status: DC | PRN

## 2013-07-06 MED ORDER — HALOPERIDOL 5 MG PO TABS
10.0000 mg | ORAL_TABLET | Freq: Two times a day (BID) | ORAL | Status: DC
  Administered 2013-07-06 – 2013-07-12 (×12): 10 mg via ORAL
  Filled 2013-07-06 (×10): qty 2
  Filled 2013-07-06: qty 8
  Filled 2013-07-06 (×3): qty 2
  Filled 2013-07-06: qty 8
  Filled 2013-07-06 (×3): qty 2

## 2013-07-06 MED ORDER — SIMVASTATIN 20 MG PO TABS
20.0000 mg | ORAL_TABLET | Freq: Every evening | ORAL | Status: DC
  Administered 2013-07-06 – 2013-07-11 (×6): 20 mg via ORAL
  Filled 2013-07-06 (×9): qty 1

## 2013-07-06 MED ORDER — ACETAMINOPHEN 325 MG PO TABS: 650.0000 mg | ORAL_TABLET | ORAL | Status: DC | PRN

## 2013-07-06 MED ORDER — NICOTINE 21 MG/24HR TD PT24: 21.0000 mg | MEDICATED_PATCH | Freq: Every day | TRANSDERMAL | Status: DC | PRN

## 2013-07-06 NOTE — Progress Notes (Signed)
[facility] Group Notes:  (Nursing/MHT/Case Management/Adjunct)  Date:  07/06/2013  Time:  2000  Type of Therapy:  Psychoeducational Skills  Participation Level:  Minimal  Participation Quality:  Attentive  Affect:  Depressed  Cognitive:  Oriented  Insight:  Limited  Engagement in Group:  Limited  Modes of Intervention:  Education  Summary of Progress/Problems: The patient had nothing to share about his day since he was just admitted to the hospital. As a theme for the day, he stated that his coping skill was to listen to music.   Hazle Coca S 07/06/2013, 8:43 PM

## 2013-07-06 NOTE — ED Notes (Signed)
Patient transported to [facility] via [employer].  Patient ambulatory with steady gait.

## 2013-07-06 NOTE — ED Notes (Signed)
Pt awake and informed he has been accepted at [facility] in [place]. Pt given breakfast tray.

## 2013-07-06 NOTE — ED Notes (Signed)
Pt in room sleeping.

## 2013-07-06 NOTE — ED Notes (Signed)
Pt states he is hearing voices telling him to hurt himself. Pt also stated his meds "usually help". Pt given what meds are available at this time. Pharmacy has been called for other meds that are ordered but not currently in ED pyxis.

## 2013-07-06 NOTE — ED Notes (Signed)
Patient sitting in chair at bedside rocking back and forth.  Patient is waiting for bed at [facility].

## 2013-07-06 NOTE — ED Notes (Signed)
Patient awaiting bed assignment at [facility].  Patient is calm, A&O.  Skin w/d. Respirations even and unlabored; able to speak in complete sentences without difficulty.  Patient eating lunch at this time.

## 2013-07-06 NOTE — Progress Notes (Signed)
Voluntary admission for a 22 y.o. Male with flat affect, depressed mood who reports hearing voices telling him to hurt himself.  Pt. Reports that he does not have a plan.  Reports that he always hears voices but for the last 3 days they have increased in intensity.  Pt. Denies SI/HI and denies visual hallucinations and contracts for safety.  Reports THC use approximately twice monthly since 2013.  Last use 2 days ago.  Pt. Given food and fluids in which he consumed 100% of his meal and was oriented to unit.  Support given.

## 2013-07-06 NOTE — Tx Team (Signed)
Initial Interdisciplinary Treatment Plan  PATIENT STRENGTHS: (choose at least two) Ability for insight Average or above average intelligence Supportive family/friends  PATIENT STRESSORS: Medication change or noncompliance   PROBLEM LIST: Problem List/Patient Goals Date to be addressed Date deferred Reason deferred Estimated date of resolution  Depression 07/06/13     Pyschosis 07/06/13     Goal-to get medications changed                                           DISCHARGE CRITERIA:  Improved stabilization in mood, thinking, and/or behavior Motivation to continue treatment in a less acute level of care Need for constant or close observation no longer present Reduction of life-threatening or endangering symptoms to within safe limits Verbal commitment to aftercare and medication compliance  PRELIMINARY DISCHARGE PLAN: Return to previous living arrangement  PATIENT/FAMIILY INVOLVEMENT: This treatment plan has been presented to and reviewed with the patient, [name], and/or family member,.  The patient and family have been given the opportunity to ask questions and make suggestions.  [name] 07/06/2013, 7:42 PM

## 2013-07-06 NOTE — ED Provider Notes (Signed)
Pt slept all night. States he is "okay". Pt has been accepted at [facility] pending bed availability, pt informed.   Devoria Albe, MD, FACEP   Ward Givens, MD 07/06/13 [phone]

## 2013-07-07 ENCOUNTER — Encounter (HOSPITAL_COMMUNITY): Payer: Self-pay | Admitting: Psychiatry

## 2013-07-07 DIAGNOSIS — F200 Paranoid schizophrenia: Secondary | ICD-10-CM

## 2013-07-07 MED ORDER — HALOPERIDOL 5 MG PO TABS
5.0000 mg | ORAL_TABLET | ORAL | Status: AC
  Administered 2013-07-07: 5 mg via ORAL
  Filled 2013-07-07 (×2): qty 1

## 2013-07-07 MED ORDER — QUETIAPINE FUMARATE 100 MG PO TABS
100.0000 mg | ORAL_TABLET | Freq: Three times a day (TID) | ORAL | Status: DC
  Administered 2013-07-07 (×2): 100 mg via ORAL
  Filled 2013-07-07 (×6): qty 1

## 2013-07-07 MED ORDER — HALOPERIDOL LACTATE 5 MG/ML IJ SOLN
5.0000 mg | INTRAMUSCULAR | Status: AC
  Filled 2013-07-07: qty 1

## 2013-07-07 MED ORDER — DIPHENHYDRAMINE HCL 50 MG PO CAPS
50.0000 mg | ORAL_CAPSULE | ORAL | Status: AC
  Administered 2013-07-07: 50 mg via ORAL
  Filled 2013-07-07: qty 2
  Filled 2013-07-07: qty 1

## 2013-07-07 MED ORDER — LORAZEPAM 1 MG PO TABS
2.0000 mg | ORAL_TABLET | ORAL | Status: AC
  Administered 2013-07-07: 2 mg via ORAL
  Filled 2013-07-07: qty 2

## 2013-07-07 MED ORDER — DIPHENHYDRAMINE HCL 50 MG/ML IJ SOLN
50.0000 mg | INTRAMUSCULAR | Status: AC
  Filled 2013-07-07: qty 1

## 2013-07-07 MED ORDER — LORAZEPAM 2 MG/ML IJ SOLN: 2.0000 mg | INTRAMUSCULAR | Status: AC

## 2013-07-07 MED ORDER — LORAZEPAM 1 MG PO TABS
2.0000 mg | ORAL_TABLET | Freq: Two times a day (BID) | ORAL | Status: DC
  Administered 2013-07-07 – 2013-07-09 (×5): 2 mg via ORAL
  Filled 2013-07-07 (×5): qty 2

## 2013-07-07 NOTE — Progress Notes (Signed)
Patient ID: [name], male   DOB: 08/26/1991, 22 y.o.   MRN: [id number] D. The patient is a new admission, but is familiar with the 400 hall. He attended evening wrap up group and was able to participate even though he just arrived. During the discussion of coping strategies, he identified listening to music as a way he copes with stressors. A. Verbal praise given for sharing with group. Medications reviewed and administered. R. Admits to auditory hallucinations but denied any suicidal ideation at this time. Compliant with medication.

## 2013-07-07 NOTE — [facility] Group Notes (Signed)
[facility] Group Notes:  (Nursing/MHT/Case Management/Adjunct)  Date:  07/07/2013  Time:  10:53 AM  Type of Therapy:  Psychoeducational Skills  Participation Level:  Active  Participation Quality:  Appropriate  Affect:  Irritable  Cognitive:  Alert  Insight:  Lacking  Engagement in Group:  Distracting  Modes of Intervention:  Discussion, Education and Exploration  Summary of Progress/Problems: Psychoeducational review of healthy support systems, and self inventory with RN. Malva Limes 07/07/2013, 10:53 AM

## 2013-07-07 NOTE — Progress Notes (Signed)
Met with pt 1:1 at start of shift. Pt noticeably anxious, agitated, nervous. Complaining of AH that put him down, tell him he's worthless. Patient known to this Clinical research associate and he does have a hx of head banging and threatening behavior. In dayroom pt unsteady on his feet but refusing to use wheelchair. Attention seeking behavior observed by staff. Reviewed chart, no prn's ordered. PA paged and chart reviewed. New orders received and administered. During med admin pt stated, "I could just start flipping shit over in this place." Set firm limits with patient that those choices would not be acceptable and that he needed to employ coping skills in conjunction with meds. Pt receptive and on reassess he is asleep. Denies SI/HI/VH. Lawrence Marse[place]

## 2013-07-07 NOTE — [facility] Group Notes (Signed)
[facility] Group Notes:  (Clinical Social Work)  07/07/2013   11:15am-12:00pm  Summary of Progress/Problems:  The main focus of today's process group was to listen to a variety of genres of music and to identify that different types of music provoke different responses.  The patient then was able to identify personally what was soothing for them, as well as energizing.  Handouts were used to record feelings evoked, as well as how patient can personally use this knowledge in sleep habits, with depression, and with other symptoms.  The patient expressed understanding of concepts, as well as knowledge of how each type of music affected them and how this can be used when they are at home as a tool in their recovery.  Type of Therapy:  Music Therapy   Participation Level:  Active  Participation Quality:  Attentive and Sharing  Affect:  Excited   Cognitive:  Oriented  Insight:  Engaged  Engagement in Therapy:  Engaged  Modes of Intervention:   Activity, Exploration  [name], [name] 07/07/2013, 12:47 PM
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
## 2013-07-07 NOTE — [facility] Counselor (Signed)
Adult Psychosocial Assessment Update Interdisciplinary Team  [facility] admissions/discharges:  Admissions Discharges  Date: 12/29/12 Date: 01/04/13  Date: Date:  Date: Date:  Date: Date:  Date: Date:   Changes since the last Psychosocial Assessment (including adherence to outpatient mental health and/or substance abuse treatment, situational issues contributing to decompensation and/or relapse). Patient reports increased anxiety and auditory hallucinations; voices encouraging self   Harm. Patient is also seeing things he does  Not believe are present. [name] lives at   [facility] facility in [place] and is client of [facility] ACT Team.  Patient's supports consist of sister, dad, and best friend who all visit regularly. [name]  Reports he is compliant with medications yet hallucinations have been increasing for   Last few months   Discharge Plan 1. Will you be returning to the same living situation after discharge?   Yes: X No:      If no, what is your plan?           2. Would you like a referral for services when you are discharged? Yes: X    If yes, for what services?  No:       Back to [facility] ACT Team       Summary and Recommendations (to be completed by the evaluator) Patient is 22 YO single male admitted with diagnosis of Chronic Paranoid Schizophrenia  Patient would benefit from crisis stabilization, medication evaluation, therapy groups for   processing thoughts/feelings/experiences, psycho ed groups for increasing coping   skills, and aftercare planning.                 Signature:  [name], 07/07/2013 10:26 AM

## 2013-07-07 NOTE — Progress Notes (Signed)
Patient ID: [name], male   DOB: [birth year], 22 y.o.   MRN: [id number] 07-07-13 nursing shift note: pt has been visible in the milieu, taking his medications and going to groups. He denied any si/hi. He does endorse auditory hallucinations. [facility]. He stated it is managed by [place] and [employer] at [phone]. A: being this patient is somewhat attention seeking RN and staff have attempted to be attentive to him, as well as met his needs. R. On his inventory sheet he wrote sleep well, appetite good, energy normal, attention good with he depression at 8 and hopelessness at 7. After discharge he plans to "continue seeing my dr and take all med's as prescribed. RN will continue to monitor and Q 15 min ck's continue.

## 2013-07-07 NOTE — Progress Notes (Signed)
[facility] Group Notes:  (Nursing/MHT/Case Management/Adjunct)  Date:  07/07/2013  Time:  2000  Type of Therapy:  Psychoeducational Skills  Participation Level:  Active  Participation Quality:  Appropriate  Affect:  Flat  Cognitive:  Appropriate  Insight:  Improving  Engagement in Group:  Improving  Modes of Intervention:  Education  Summary of Progress/Problems: The patient described his day as having been "pretty straight". He states that he feels less tired than he did yesterday. In addition, he also mentioned that his voices have decreased.  His goal for tomorrow is to use the coping skills that he learned in group to "stay calm". As for the theme of the day, he mentioned that his support system consists of the group home staff and occasionally his father. The patient was encouraged to use the wheelchair as he complained of weakness earlier in the day.    Hazle Coca S 07/07/2013, 9:50 PM

## 2013-07-07 NOTE — [facility] Suicide Risk Assessment (Signed)
Suicide Risk Assessment  Admission Assessment     Nursing information obtained from:  Patient Demographic factors:  Adolescent or young adult Current Mental Status:   (denies) Loss Factors:  NA Historical Factors:  Prior suicide attempts Risk Reduction Factors:  Positive social support  CLINICAL FACTORS:   Schizophrenia:   Less than 20 years old Paranoid or undifferentiated type  COGNITIVE FEATURES THAT CONTRIBUTE TO RISK:  Closed-mindedness Polarized thinking Thought constriction (tunnel vision)    SUICIDE RISK:   Moderate:  Frequent suicidal ideation with limited intensity, and duration, some specificity in terms of plans, no associated intent, good self-control, limited dysphoria/symptomatology, some risk factors present, and identifiable protective factors, including available and accessible social support.  PLAN OF CARE: Supportive approach/coping skills                               Improve reality testing                               Optimize treatment with psychotropics  I certify that inpatient services furnished can reasonably be expected to improve the patient's condition.  Tzion Wedel A 07/07/2013, 3:35 PM

## 2013-07-08 NOTE — Progress Notes (Signed)
Patient ID: [name], male   DOB: [birth year], 22 y.o.   MRN: [id number] D:Patient presents with flat, blunted affect.  He walked to the medication window this am and then later was observed in the hallway in a wheelchair.  Patient was asked the reason why he was in a wheelchair, he stated "because I feel weak."  Staff yesterday gave him a wheelchair after he complained of weakness and dizziness.  Asked patient why he walked to the window this morning, he stated "I shouldn't have."  Patient is attending groups with minimal participation.  He denies any SI/HI; however, he is having auditory hallucinations that he reports is "chatter."  A:Continue to monitor medication management and MD orders.  Safety checks completed every 15 minutes per protocol.  R:Patient has minimal interaction with staff.

## 2013-07-08 NOTE — Progress Notes (Signed)
Recreation Therapy Notes  Date: 10.20.2014 Time: 9:30am Location: [address] Dayroom  Group Topic: Coping Skills  Goal Area(s) Addresses:  Patient will express themselves through the use of art.  Patient will identify what positive changes have been made in their lives.   Behavioral Response: Did not attend.   Marykay Lex Cordale Manera, LRT/CTRS  Briseis Aguilera L 07/08/2013 11:37 AM

## 2013-07-08 NOTE — Progress Notes (Signed)
Late Entry: Patient did not attend 11am psycho educational group. Patient states that he was sleepy and dizzy. RN notified.

## 2013-07-08 NOTE — Tx Team (Signed)
  Interdisciplinary Treatment Plan Update   Date Reviewed:  07/08/2013  Time Reviewed:  8:09 AM  Progress in Treatment:   Attending groups: Yes Participating in groups: Minimally Taking medication as prescribed: Yes  Tolerating medication: Yes Family/Significant other contact made: No  Patient understands diagnosis: Yes As evidenced by asking for help with voices Discussing patient identified problems/goals with staff: Yes  See initial care plan Medical problems stabilized or resolved: Yes Denies suicidal/homicidal ideation: Yes  In tx team Patient has not harmed self or others: Yes  For review of initial/current patient goals, please see plan of care.  Estimated Length of Stay:  4-5 days  Reason for Continuation of Hospitalization: Hallucinations Medication stabilization  New Problems/Goals identified:  N/A  Discharge Plan or Barriers:   return to group home, follow up outpt  Additional Comments:  Voluntary admission for a 22 y.o. Male with flat affect, depressed mood who reports hearing voices telling him to hurt himself. Pt. Reports that he does not have a plan. Reports that he always hears voices but for the last 3 days they have increased in intensity. Pt. Denies SI/HI and denies visual hallucinations and contracts for safety. Reports THC use approximately twice monthly since 2013.    Attendees:  Signature: Thedore Mins, MD 07/08/2013 8:09 AM   Signature: Richelle Ito, [name] 07/08/2013 8:09 AM  Signature: Fransisca Kaufmann, NP 07/08/2013 8:09 AM  Signature: Joslyn Devon, RN 07/08/2013 8:09 AM  Signature: Liborio Nixon, RN 07/08/2013 8:09 AM  Signature:  07/08/2013 8:09 AM  Signature:   07/08/2013 8:09 AM  Signature:    Signature:    Signature:    Signature:    Signature:    Signature:      Scribe for Treatment Team:   Richelle Ito, [name]  07/08/2013 8:09 AM

## 2013-07-08 NOTE — [facility] Group Notes (Signed)
[facility] [name] Aftercare Discharge Planning Group Note   07/08/2013 8:10 AM  Participation Quality:  Did not attend    [place]

## 2013-07-08 NOTE — [facility] Group Notes (Signed)
[facility] [name] Group Therapy  07/08/2013 1:15 pm  Type of Therapy: Process Group Therapy  Participation Level: Minimal  Participation Quality:  Appropriate  Affect:  Flat  Cognitive:  Oriented  Insight:  Improving  Engagement in Group:  Limited  Engagement in Therapy:  Limited  Modes of Intervention:  Activity, Clarification, Education, Problem-solving and Support  Summary of Progress/Problems: Today's group addressed the issue of overcoming obstacles.  Patients were asked to identify their biggest obstacle post d/c that stands in the way of their on-going success, and then problem solve as to how to manage this.  [name] stated his main obstacle is himself and his mental illness.  He went on to explain that he often yells at voices that are in his head, and others think he is yelling at them.  He left soon after sharing this.  Daryel Gerald B 07/08/2013   1:26 PM

## 2013-07-08 NOTE — Progress Notes (Signed)
[facility] MD Progress Note  07/08/2013 1:16 PM [name]  MRN:  [id number] Subjective:   Patient states "I'm fine. I still hearing voices telling me to kill myself. I hear them all the time. I feel depressed. I would rate it as a six.   Objective:  Patient observed resting in bed and appears depressed. He is difficult to engage in conversation and gives minimal responses to questions posed by Clinical research associate. Nursing notes indicate that patient has been engaging in attention seeking behaviors complaining of feeling dizzy and saying that he needed a wheelchair. Patient reported to ambulate without problems and vitals are WNL. Patient also reported to make verbal threats of acting out but appeared to respond to limits set by nursing staff. The MAR indicates that patient received now dose of Haldol and Benadryl last night for agitation.  Diagnosis:   DSM5:  Axis I: Chronic Paranoid Schizophrenia Axis II: Cluster B Traits Axis III:  Past Medical History  Diagnosis Date  . Schizophrenia   . Hypothyroidism   . Hypercholesterolemia   . Hypertension    Axis IV: other psychosocial or environmental problems, problems related to social environment and problems with primary support group Axis V: 41-50 serious symptoms  ADL's:  Intact  Sleep: Fair  Appetite:  Good  Suicidal Ideation:  Passive SI to walk into traffic  Homicidal Ideation:  Denies AEB (as evidenced by):  Psychiatric Specialty Exam: Review of Systems  Constitutional: Negative.   HENT: Negative.   Eyes: Negative.   Respiratory: Negative.   Cardiovascular: Negative.   Gastrointestinal: Negative.   Genitourinary: Negative.   Musculoskeletal: Negative.   Skin: Negative.   Neurological: Negative.   Endo/Heme/Allergies: Negative.   Psychiatric/Behavioral: Positive for depression, suicidal ideas, hallucinations and substance abuse. Negative for memory loss. The patient is nervous/anxious and has insomnia.     Blood pressure  121/84, pulse 101, temperature 96.6 F (35.9 C), temperature source Oral, resp. rate 18, height 5\' 9"  (1.753 m), weight 127.007 kg (280 lb).Body mass index is 41.33 kg/(m^2).  General Appearance: Disheveled  Eye Contact::  Minimal  Speech:  Slow  Volume:  Decreased  Mood:  Depressed, Dysphoric and Hopeless  Affect:  Blunt  Thought Process:  Intact  Orientation:  Full (Time, Place, and Person)  Thought Content:  Hallucinations: Auditory  Suicidal Thoughts:  Yes.  with intent/plan  Homicidal Thoughts:  No  Memory:  Immediate;   Fair Recent;   Fair Remote;   Fair  Judgement:  Impaired  Insight:  Lacking  Psychomotor Activity:  Decreased  Concentration:  Poor  Recall:  Fair  Akathisia:  No  Handed:  Right  AIMS (if indicated):     Assets:  Desire for Improvement Leisure Time Physical Health Resilience  Sleep:  Number of Hours: 6.5   Current Medications: Current Facility-Administered Medications  Medication Dose Route Frequency Provider Last Rate Last Dose  . acetaminophen (TYLENOL) tablet 650 mg  650 mg Oral Q4H PRN Court Joy, PA-C      . alum & mag hydroxide-simeth (MAALOX/MYLANTA) 200-200-20 MG/5ML suspension 30 mL  30 mL Oral PRN Court Joy, PA-C      . benztropine (COGENTIN) tablet 1 mg  1 mg Oral Daily Court Joy, PA-C   1 mg at 07/08/13 0749  . busPIRone (BUSPAR) tablet 10 mg  10 mg Oral TID Court Joy, PA-C   10 mg at 07/08/13 1314  . divalproex (DEPAKOTE) DR tablet 1,500 mg  1,500 mg Oral QHS  Court Joy, PA-C   1,500 mg at 07/07/13 2158  . fenofibrate tablet 160 mg  160 mg Oral Daily Court Joy, PA-C   160 mg at 07/08/13 0749  . FLUoxetine (PROZAC) tablet 60 mg  60 mg Oral Daily Court Joy, PA-C   60 mg at 07/08/13 0749  . haloperidol (HALDOL) tablet 10 mg  10 mg Oral BID Court Joy, PA-C   10 mg at 07/08/13 0750  . ibuprofen (ADVIL,MOTRIN) tablet 400 mg  400 mg Oral Q8H PRN Court Joy, PA-C      . levothyroxine (SYNTHROID,  LEVOTHROID) tablet 100 mcg  100 mcg Oral QAC breakfast Court Joy, PA-C   100 mcg at 07/08/13 4098  . LORazepam (ATIVAN) tablet 2 mg  2 mg Oral BID Nanine Means, NP   2 mg at 07/08/13 0749  . magnesium hydroxide (MILK OF MAGNESIA) suspension 30 mL  30 mL Oral Daily PRN Court Joy, PA-C      . nicotine (NICODERM CQ - dosed in mg/24 hours) patch 21 mg  21 mg Transdermal Daily PRN Court Joy, PA-C      . propranolol (INDERAL) tablet 10 mg  10 mg Oral BID Court Joy, PA-C   10 mg at 07/08/13 0749  . simvastatin (ZOCOR) tablet 20 mg  20 mg Oral QPM Court Joy, PA-C   20 mg at 07/07/13 1711  . traZODone (DESYREL) tablet 150 mg  150 mg Oral QHS Court Joy, PA-C   150 mg at 07/07/13 2158    Lab Results: No results found for this or any previous visit (from the past 48 hour(s)).  Physical Findings: AIMS:  , ,  ,  ,    CIWA:    COWS:     Treatment Plan Summary: Daily contact with patient to assess and evaluate symptoms and progress in treatment Medication management  Plan: Continue crisis management and stabilization.  Medication management: Reviewed with patient who stated no untoward effects. Patient's home medications of Depakote, Buspar, Prozac, and Haldol. Patient started on Ativan 2 mg BID for anxiety.  Encouraged patient to attend groups and participate in group counseling sessions and activities.  Discharge plan in progress.  Continue current treatment plan.  Address health issues: Vitals reviewed and stable.   Medical Decision Making Problem Points:  Established problem, stable/improving (1) and Review of psycho-social stressors (1) Data Points:  Review of medication regiment & side effects (2)  I certify that inpatient services furnished can reasonably be expected to improve the patient's condition.   Andrew Soria NP-C 07/08/2013, 1:16 PM

## 2013-07-08 NOTE — Progress Notes (Signed)
Patient ID: [name], male   DOB: [birth year], 22 y.o.   MRN: [id number] D: Pt. In dayroom watching TV, reports still hear voices "a little bit, but I can't make it out." "voice got really bad three days ago, the nurse from the ACT team thought it would be better if I came here"  A: Writer introduced self to client and provided emotional support. Pt. Encouraged to attend group. Staff will monitor q80min for safety. R: Pt. Is safe on the unit. Pt. Attended group.

## 2013-07-09 MED ORDER — DIPHENHYDRAMINE HCL 50 MG/ML IJ SOLN
25.0000 mg | Freq: Once | INTRAMUSCULAR | Status: AC
  Administered 2013-07-09: 25 mg via INTRAMUSCULAR
  Filled 2013-07-09: qty 0.5

## 2013-07-09 MED ORDER — HALOPERIDOL LACTATE 5 MG/ML IJ SOLN
INTRAMUSCULAR | Status: AC
  Administered 2013-07-09: 5 mg via INTRAMUSCULAR
  Filled 2013-07-09: qty 1

## 2013-07-09 MED ORDER — HALOPERIDOL LACTATE 5 MG/ML IJ SOLN
5.0000 mg | Freq: Once | INTRAMUSCULAR | Status: AC
  Administered 2013-07-09: 5 mg via INTRAMUSCULAR
  Filled 2013-07-09: qty 1

## 2013-07-09 MED ORDER — QUETIAPINE FUMARATE 50 MG PO TABS
50.0000 mg | ORAL_TABLET | Freq: Every evening | ORAL | Status: DC | PRN
  Administered 2013-07-09 (×2): 50 mg via ORAL
  Filled 2013-07-09 (×5): qty 1

## 2013-07-09 MED ORDER — LORAZEPAM 2 MG/ML IJ SOLN
2.0000 mg | Freq: Once | INTRAMUSCULAR | Status: AC
  Administered 2013-07-09: 2 mg via INTRAMUSCULAR

## 2013-07-09 MED ORDER — DIPHENHYDRAMINE HCL 50 MG/ML IJ SOLN
INTRAMUSCULAR | Status: AC
  Administered 2013-07-09: 25 mg via INTRAMUSCULAR
  Filled 2013-07-09: qty 1

## 2013-07-09 MED ORDER — QUETIAPINE FUMARATE 50 MG PO TABS
ORAL_TABLET | ORAL | Status: AC
  Administered 2013-07-09: 50 mg via ORAL
  Filled 2013-07-09: qty 1

## 2013-07-09 MED ORDER — LORAZEPAM 1 MG PO TABS
1.0000 mg | ORAL_TABLET | Freq: Three times a day (TID) | ORAL | Status: DC | PRN
  Administered 2013-07-09: 1 mg via ORAL

## 2013-07-09 MED ORDER — LORAZEPAM 2 MG/ML IJ SOLN
INTRAMUSCULAR | Status: AC
  Administered 2013-07-09: 2 mg via INTRAMUSCULAR
  Filled 2013-07-09: qty 1

## 2013-07-09 MED ORDER — LORAZEPAM 1 MG PO TABS
ORAL_TABLET | ORAL | Status: AC
  Administered 2013-07-09: 1 mg via ORAL
  Filled 2013-07-09: qty 1

## 2013-07-09 NOTE — Progress Notes (Signed)
Patient ID: [name], male   DOB: 18-Mar-1991, 22 y.o.   MRN: [id number] D:Patient has been in bed asleep all morning.  He was asked to come to medication window several times.  He was also asked to go to group which he refused.  Patient met with MD and reports he is still having auditory hallucinations non specific.  He denies any SI/HI.  Patient plans to go back to the group home where he was residing before he was admitted.  He has been compliant with his meds.  A depokote pill was found in his room on the bedside table last nite.  He denies trying to cheek his meds and was observed taking his meds this am.  Patient also states he is going to group and he has not. A:Continue to monitor medication management and MD orders.  Safety checks completed every 15 minutes per protocol.  R:Patient has been encouraged to attend group and participate in his treatment.

## 2013-07-09 NOTE — Progress Notes (Signed)
Seen and agreed. Rahima Fleishman, MD 

## 2013-07-09 NOTE — Progress Notes (Signed)
D   Pt was irritable and said he felt like he was going to hit the walls again   He said the voices were bothering him and they were just loud and overwhelming   He said that seroquel was the medication that helped him the most   A discussed medications and behaviors with pt   Received orders from pa for seroquel   Administered medications and monitored for effectiveness   Verbal support given   Q 15 min checks R   Pt safe at present  Has become much calmer since taking the seroquel  He verbalized understanding of medications

## 2013-07-09 NOTE — Progress Notes (Signed)
Patient allowed to go to cafeteria.  Patient is cooperative and in a pleasant mood.  He is laughing and states, "that medication is starting to work."  Advised patient is he felt too unsteady, he would need to stay back from dinner.  Patient was agreeable to staff's suggestions.  Behavior has been appropriate.

## 2013-07-09 NOTE — Progress Notes (Signed)
Adult Psychoeducational Group Note  Date:  07/09/2013 Time:  8:00 pm  Group Topic/Focus:  Wrap-Up Group:   The focus of this group is to help patients review their daily goal of treatment and discuss progress on daily workbooks.  Participation Level:  Active  Participation Quality:  Appropriate and Sharing  Affect:  Appropriate  Cognitive:  Appropriate  Insight: Appropriate  Engagement in Group:  Engaged  Modes of Intervention:  Discussion, Education, Socialization and Support  Additional Comments:  Pt stated that he is in the hospital because he has been hearing voices telling him to commit suicide. Pt stated that he needs a medication adjustment as well. Pt stated that he is thankful for his family and love ones and the fact that he is alive.   Laural Benes, Lex Linhares 07/09/2013, 10:19 PM

## 2013-07-09 NOTE — [facility] Group Notes (Signed)
[facility] [name] Group Therapy  07/09/2013 , 12:22 PM   Type of Therapy:  Group Therapy  Participation Level:  Invited,  Chose not to attend    Summary of Progress/Problems: Today's group focused on the term Diagnosis.  Participants were asked to define the term, and then pronounce whether it is a negative, positive or neutral term.  Daryel Gerald B 07/09/2013 , 12:22 PM

## 2013-07-09 NOTE — Progress Notes (Addendum)
Patient ID: [name], male   DOB: [birth year], 22 y.o.   MRN: [id number] Staff found depakote pill in client's room on night stand. Pt. Says it must have fell out of his cup earlier today, unsure. Writer explained to client how important it is for him to take his medications. Pt. Nods in agreement. Writer to check pt. Mouth for cheeking after given pm meds.

## 2013-07-09 NOTE — Progress Notes (Signed)
Adult Psychoeducational Group Note  Date:  07/09/2013 Time:  2:39 AM  Group Topic/Focus:  Wrap-Up Group:   The focus of this group is to help patients review their daily goal of treatment and discuss progress on daily workbooks.  Participation Level:  Minimal  Participation Quality:  Appropriate  Affect:  Appropriate  Cognitive:  Appropriate  Insight: Limited  Engagement in Group:  Engaged  Modes of Intervention:  Support  Additional Comments:  Patient attended and participated in group tonight. He reports having a good day. He went outside today, eat well and attended his groups. For his wellness he attends church where he realized that her is becoming more outspoken  Scot Dock 07/09/2013, 2:39 AM

## 2013-07-09 NOTE — Progress Notes (Signed)
Patient ID: [name], male   DOB: 1991/01/30, 22 y.o.   MRN: [id number] [facility] MD Progress Note  07/09/2013 10:48 AM [name]  MRN:  [id number] Subjective:  " I am still hearing voices telling me to kill myself."  Objective:  Patient continue to reports auditory and visual hallucinations, anxiety and feeling depressed. He reports low energy level, poor motivation and feeling hopeless. He also verbalized that he has been hearing voices telling him to hurt himself. He is compliant with his medications and has not endorsed any adverse reactions. Diagnosis:   DSM5:  Axis I: Chronic Paranoid Schizophrenia Axis II: Cluster B Traits Axis III:  Past Medical History  Diagnosis Date  . Hypothyroidism   . Hypercholesterolemia   . Hypertension    Axis IV: other psychosocial or environmental problems, problems related to social environment and problems with primary support group Axis V: 41-50 serious symptoms  ADL's:  Intact  Sleep: Fair  Appetite:  Good  Suicidal Ideation:  Passive SI to walk into traffic  Homicidal Ideation:  Denies AEB (as evidenced by):  Psychiatric Specialty Exam: Review of Systems  Constitutional: Negative.   HENT: Negative.   Eyes: Negative.   Respiratory: Negative.   Cardiovascular: Negative.   Gastrointestinal: Negative.   Genitourinary: Negative.   Musculoskeletal: Negative.   Skin: Negative.   Neurological: Negative.   Endo/Heme/Allergies: Negative.   Psychiatric/Behavioral: Positive for depression, suicidal ideas, hallucinations and substance abuse. Negative for memory loss. The patient is nervous/anxious and has insomnia.     Blood pressure 133/88, pulse 103, temperature 97.2 F (36.2 C), temperature source Oral, resp. rate 20, height 5\' 9"  (1.753 m), weight 127.007 kg (280 lb).Body mass index is 41.33 kg/(m^2).  General Appearance: Disheveled  Eye Contact::  Minimal  Speech:  Slow  Volume:  Decreased  Mood:  Depressed,  Dysphoric and Hopeless  Affect:  Blunt  Thought Process:  Intact  Orientation:  Full (Time, Place, and Person)  Thought Content:  Hallucinations: Auditory  Suicidal Thoughts:  Yes.  with intent/plan  Homicidal Thoughts:  No  Memory:  Immediate;   Fair Recent;   Fair Remote;   Fair  Judgement:  Impaired  Insight:  Lacking  Psychomotor Activity:  Decreased  Concentration:  Poor  Recall:  Fair  Akathisia:  No  Handed:  Right  AIMS (if indicated):     Assets:  Desire for Improvement Leisure Time Physical Health Resilience  Sleep:  Number of Hours: 4.75   Current Medications: Current Facility-Administered Medications  Medication Dose Route Frequency Provider Last Rate Last Dose  . acetaminophen (TYLENOL) tablet 650 mg  650 mg Oral Q4H PRN Court Joy, PA-C      . alum & mag hydroxide-simeth (MAALOX/MYLANTA) 200-200-20 MG/5ML suspension 30 mL  30 mL Oral PRN Court Joy, PA-C      . benztropine (COGENTIN) tablet 1 mg  1 mg Oral Daily Court Joy, PA-C   1 mg at 07/09/13 7829  . busPIRone (BUSPAR) tablet 10 mg  10 mg Oral TID Court Joy, PA-C   10 mg at 07/09/13 0902  . divalproex (DEPAKOTE) DR tablet 1,500 mg  1,500 mg Oral QHS Court Joy, PA-C   1,500 mg at 07/08/13 2206  . fenofibrate tablet 160 mg  160 mg Oral Daily Court Joy, PA-C   160 mg at 07/09/13 5621  . FLUoxetine (PROZAC) tablet 60 mg  60 mg Oral Daily Court Joy, PA-C   60 mg  at 07/09/13 0903  . haloperidol (HALDOL) tablet 10 mg  10 mg Oral BID Court Joy, PA-C   10 mg at 07/09/13 0901  . ibuprofen (ADVIL,MOTRIN) tablet 400 mg  400 mg Oral Q8H PRN Court Joy, PA-C      . levothyroxine (SYNTHROID, LEVOTHROID) tablet 100 mcg  100 mcg Oral QAC breakfast Court Joy, PA-C   100 mcg at 07/09/13 6045  . LORazepam (ATIVAN) tablet 2 mg  2 mg Oral BID Nanine Means, NP   2 mg at 07/09/13 0902  . magnesium hydroxide (MILK OF MAGNESIA) suspension 30 mL  30 mL Oral Daily PRN Court Joy, PA-C      . nicotine (NICODERM CQ - dosed in mg/24 hours) patch 21 mg  21 mg Transdermal Daily PRN Court Joy, PA-C      . propranolol (INDERAL) tablet 10 mg  10 mg Oral BID Court Joy, PA-C   10 mg at 07/09/13 4098  . simvastatin (ZOCOR) tablet 20 mg  20 mg Oral QPM Court Joy, PA-C   20 mg at 07/08/13 1647  . traZODone (DESYREL) tablet 150 mg  150 mg Oral QHS Court Joy, PA-C   150 mg at 07/08/13 2206    Lab Results: No results found for this or any previous visit (from the past 48 hour(s)).  Physical Findings: AIMS: Facial and Oral Movements Muscles of Facial Expression: None, normal Lips and Perioral Area: None, normal Jaw: None, normal Tongue: None, normal,Extremity Movements Upper (arms, wrists, hands, fingers): None, normal Lower (legs, knees, ankles, toes): None, normal, Trunk Movements Neck, shoulders, hips: None, normal, Overall Severity Severity of abnormal movements (highest score from questions above): None, normal Incapacitation due to abnormal movements: None, normal Patient's awareness of abnormal movements (rate only patient's report): No Awareness, Dental Status Current problems with teeth and/or dentures?: No Does patient usually wear dentures?: No  CIWA:    COWS:     Treatment Plan Summary: Daily contact with patient to assess and evaluate symptoms and progress in treatment Medication management  Plan: Continue crisis management and stabilization.  Medication management: Reviewed with patient who stated no untoward effects. Patient to continue his  home medications of Depakote, Buspar, Prozac, and Haldol.  Continues Ativan 2 mg BID prn for anxiety.  Encouraged patient to attend groups and participate in group counseling sessions and activities.  Discharge plan in progress.  Continue current treatment plan.  Address health issues: Vitals reviewed and stable.   Medical Decision Making Problem Points:  Established problem, slight  improvement (1) and Review of psycho-social stressors (1) Data Points:  Review of medication regiment & side effects (2)  I certify that inpatient services furnished can reasonably be expected to improve the patient's condition.   Thedore Mins, MD 07/09/2013, 10:48 AM

## 2013-07-09 NOTE — Progress Notes (Signed)
Patient ID: [name], male   DOB: Jul 25, 1991, 22 y.o.   MRN: [id number] Patient was in room banging on wall.  Nurses heard the commotion in the station and staff went to his room.  Patient stopped banging on the wall and stood in the middle of his room.  When asked what was causing him to bang on the wall, he stated, "I'm bored."  "I wonder I could break that window.  I could pick up this chair and throw it through the Attempted to engage patient in conversation and he would smile and say, "the voices are yelling at me.  They make me angry."  This writer offered to take him to the day room which he refused.  Patient was asked to go outside during rec time, he refused.  Patient was asked if he would like to go to the quiet room and he agreed.  MHT and nurse escorted him to quiet room.  Patient immediately started punching the walls aggressively stating, "this feels good.  I might hurt my hand."  Patient kept hitting the wall and looking at his hand.  MHT and nurse attempted to deescalate him and patient was determined to keep pounding the walls.  NP ordered 2 mg ativan, 5 mg haldol and 25 mg benadryl to be give IM.  Patient came back to his room with the MHT.  Injection was brought in and he refused medication.  Explained to patient that if he could contract for safety and agree to not punch the walls, the medication will be held.  It was also explained to him that if the behavior continued, he would not have a choice whether or not to take the medication. Patient would not agree, however continued to sit in his chair.  Staff left the room and patient again started banging his walls.  Injection was brought in and patient was told to lie on the bed.  Patient again started refusing and being uncooperative.  Patient was told that he could cooperate or we would hold him down.  Patient gave nurse his arm and injection was given in left deltoid.  Patient proceeded to throw things and punch the wall. Patient is  currently sitting in the day room.  He is slightly sedated, however, his behavior is appropriate.

## 2013-07-10 MED ORDER — BUSPIRONE HCL 15 MG PO TABS
15.0000 mg | ORAL_TABLET | Freq: Two times a day (BID) | ORAL | Status: DC
  Administered 2013-07-10 – 2013-07-12 (×4): 15 mg via ORAL
  Filled 2013-07-10: qty 4
  Filled 2013-07-10 (×5): qty 1
  Filled 2013-07-10: qty 4
  Filled 2013-07-10: qty 1

## 2013-07-10 MED ORDER — OLANZAPINE 10 MG PO TBDP
10.0000 mg | ORAL_TABLET | Freq: Three times a day (TID) | ORAL | Status: DC | PRN
  Administered 2013-07-10 – 2013-07-12 (×3): 10 mg via ORAL
  Filled 2013-07-10 (×3): qty 1

## 2013-07-10 MED ORDER — DIVALPROEX SODIUM 500 MG PO DR TAB: 750.0000 mg | DELAYED_RELEASE_TABLET | Freq: Two times a day (BID) | ORAL | Status: DC

## 2013-07-10 MED ORDER — LORAZEPAM 1 MG PO TABS
1.0000 mg | ORAL_TABLET | Freq: Two times a day (BID) | ORAL | Status: DC | PRN
  Administered 2013-07-12: 1 mg via ORAL
  Filled 2013-07-10: qty 1

## 2013-07-10 MED ORDER — BENZTROPINE MESYLATE 0.5 MG PO TABS
0.5000 mg | ORAL_TABLET | Freq: Two times a day (BID) | ORAL | Status: DC
  Administered 2013-07-10 – 2013-07-12 (×4): 0.5 mg via ORAL
  Filled 2013-07-10 (×3): qty 1
  Filled 2013-07-10: qty 4
  Filled 2013-07-10 (×5): qty 1
  Filled 2013-07-10: qty 4

## 2013-07-10 MED ORDER — DIVALPROEX SODIUM 500 MG PO DR TAB
1000.0000 mg | DELAYED_RELEASE_TABLET | Freq: Two times a day (BID) | ORAL | Status: DC
  Administered 2013-07-10 – 2013-07-12 (×4): 1000 mg via ORAL
  Filled 2013-07-10: qty 8
  Filled 2013-07-10 (×2): qty 2
  Filled 2013-07-10: qty 8
  Filled 2013-07-10 (×4): qty 2

## 2013-07-10 NOTE — [facility] Suicide Risk Assessment (Signed)
[facility] INPATIENT:  Family/Significant Other Suicide Prevention Education  Suicide Prevention Education:  Patient Discharged to Other Healthcare Facility:  Suicide Prevention Education Not Provided: [name] is returning to his ALF and is following up with the [facility] ACT team, both of whom are well versed in working with [name] when he has SI. The patient is discharging to another healthcare facility for continuation of treatment.  The patient's medical information, including suicide ideations and risk factors, are a part of the medical information shared with the receiving healthcare facility.  [name] 07/10/2013, 4:25 PM

## 2013-07-10 NOTE — Progress Notes (Signed)
Writer came to patient's room with a cup of gatorade and found the patient in the floor on his knees; fall unwitnessed. No acute distress noted. On-call MD notified; no new orders given. Patient verbalized that he did not want any family or close to kin notified about his fall.

## 2013-07-10 NOTE — Progress Notes (Signed)
D: Patient's affect is flat and mood is irritable. Writer observed patient lying in bed all day. Patient is not attending groups, but is compliant with current medication regimen.  A: Support and encouragement provided to patient. Scheduled medications administered per MD orders. Maintain Q15 minute checks for safety.  R: Patient receptive. Denies SI/HI/AVH. Patient remains safe on the unit.

## 2013-07-10 NOTE — Progress Notes (Signed)
Patient ID: [name], male   DOB: [birth year], 22 y.o.   MRN: [id number] Pt. Refusing post fall blood pressures, told MHT he would get up and start hitting the walls if awaken for BP's. Pt. Also refuses fall risk arm band and red sock, noting the arm bands irritate arm. "I can't sleep with them on" Pt. Exhibiting signs of agitation and anxiety. Zyprexa 10 mg administered.

## 2013-07-10 NOTE — [facility] Group Notes (Signed)
[facility] [name] Aftercare Discharge Planning Group Note   07/10/2013 9:49 AM  Participation Quality:  Did not attend    [name]

## 2013-07-10 NOTE — Progress Notes (Signed)
Adult Psychoeducational Group Note  Date:  07/10/2013 Time:  11:00AM Group Topic/Focus:  Personal Choices and Values:   The focus of this group is to help patients assess and explore the importance of values in their lives, how their values affect their decisions, how they express their values and what opposes their expression.  Participation Level:  Did Not Attend    Additional Comments: Pt. Didn't attend group.   Bing Plume D 07/10/2013, 11:52 AM

## 2013-07-10 NOTE — [facility] Group Notes (Addendum)
[facility] [employer] Group Therapy  07/10/2013 , 1:22 PM    Type of Therapy:  [employer] Presentation  Participation Level: Did not attend.  When asked if he would attend, made a dismissive sound with his lips  Came half way through.  Whispered a question to me.  I asked him to wait to the end of group.  He did, and sat quietly through the rest of group.    Summary of Progress/Problems:  Onalee Hua from [employer] came to present his recovery story and play the guitar.    [name] 07/10/2013 , 1:22 PM

## 2013-07-10 NOTE — Progress Notes (Signed)
Recreation Therapy Notes  Date: 10.22.2014 Time: 9:30am Location: [address] Dayroom  Group Topic: Self-Esteem  Goal Area(s) Addresses:  Patient will effectively give examples of way to increase self-esteem. Patient will verbalize benefit of increased self-esteem.   Behavioral Response: Did not attend  Jearl Klinefelter, LRT/CTRS  Jearl Klinefelter 07/10/2013 12:17 PM

## 2013-07-10 NOTE — Progress Notes (Signed)
Patient ID: [name], male   DOB: [birth year], 22 y.o.   MRN: [id number]  [facility] MD Progress Note  07/10/2013 10:21 AM [name]  MRN:  [id number] Subjective:  " I can't talk to you right now am feeling sleepy and irritable."  Objective: Patient who has no insight into his problem and according to the staff has been exhibiting attention seeking behavior. He continues to report anxiety, depressed mood, auditory and visual hallucinations. He has also being getting agitated easily, reportedly punched the wall yesterday. He has minimized his interaction with his peers and staffs preferring to stay in his room all day and night. He is compliant with his medications and has not endorsed any adverse reactions. Diagnosis:   DSM5:  Axis I: Chronic Paranoid Schizophrenia Axis II: Cluster B Traits Axis III:  Past Medical History  Diagnosis Date  . Hypothyroidism   . Hypercholesterolemia   . Hypertension    Axis IV: other psychosocial or environmental problems, problems related to social environment and problems with primary support group Axis V: 41-50 serious symptoms  ADL's:  Intact  Sleep: Fair  Appetite:  Good  Suicidal Ideation:  Passive SI to walk into traffic  Homicidal Ideation:  Denies AEB (as evidenced by):  Psychiatric Specialty Exam: Review of Systems  Constitutional: Negative.   HENT: Negative.   Eyes: Negative.   Respiratory: Negative.   Cardiovascular: Negative.   Gastrointestinal: Negative.   Genitourinary: Negative.   Musculoskeletal: Negative.   Skin: Negative.   Neurological: Negative.   Endo/Heme/Allergies: Negative.   Psychiatric/Behavioral: Positive for depression, suicidal ideas and hallucinations. Negative for memory loss. The patient is nervous/anxious.     Blood pressure 114/61, pulse 88, temperature 97.4 F (36.3 C), temperature source Oral, resp. rate 16, height 5\' 9"  (1.753 m), weight 127.007 kg (280 lb).Body mass index is 41.33  kg/(m^2).  General Appearance: Disheveled  Eye Contact::  Minimal  Speech:  Slow  Volume:  Decreased  Mood:  Depressed, Dysphoric and Hopeless  Affect:  Blunt  Thought Process:  Intact  Orientation:  Full (Time, Place, and Person)  Thought Content:  Hallucinations: Auditory  Suicidal Thoughts:  Yes.  with intent/plan  Homicidal Thoughts:  No  Memory:  Immediate;   Fair Recent;   Fair Remote;   Fair  Judgement:  Impaired  Insight:  Lacking  Psychomotor Activity:  Decreased  Concentration:  Poor  Recall:  Fair  Akathisia:  No  Handed:  Right  AIMS (if indicated):     Assets:  Desire for Improvement Leisure Time Physical Health Resilience  Sleep:  Number of Hours: 6.75   Current Medications: Current Facility-Administered Medications  Medication Dose Route Frequency Provider Last Rate Last Dose  . acetaminophen (TYLENOL) tablet 650 mg  650 mg Oral Q4H PRN Court Joy, PA-C      . alum & mag hydroxide-simeth (MAALOX/MYLANTA) 200-200-20 MG/5ML suspension 30 mL  30 mL Oral PRN Court Joy, PA-C      . benztropine (COGENTIN) tablet 0.5 mg  0.5 mg Oral BID Celia Friedland      . busPIRone (BUSPAR) tablet 15 mg  15 mg Oral BID Bowman Higbie      . divalproex (DEPAKOTE) DR tablet 1,000 mg  1,000 mg Oral BID PC Kinya Meine      . fenofibrate tablet 160 mg  160 mg Oral Daily Court Joy, PA-C   160 mg at 07/10/13 4010  . FLUoxetine (PROZAC) tablet 60 mg  60 mg Oral  Daily Court Joy, PA-C   60 mg at 07/10/13 1610  . haloperidol (HALDOL) tablet 10 mg  10 mg Oral BID Court Joy, PA-C   10 mg at 07/10/13 0820  . ibuprofen (ADVIL,MOTRIN) tablet 400 mg  400 mg Oral Q8H PRN Court Joy, PA-C      . levothyroxine (SYNTHROID, LEVOTHROID) tablet 100 mcg  100 mcg Oral QAC breakfast Court Joy, PA-C   100 mcg at 07/10/13 9604  . LORazepam (ATIVAN) tablet 1 mg  1 mg Oral Q12H PRN Lynard Postlewait      . magnesium hydroxide (MILK OF MAGNESIA) suspension 30 mL  30  mL Oral Daily PRN Court Joy, PA-C      . nicotine (NICODERM CQ - dosed in mg/24 hours) patch 21 mg  21 mg Transdermal Daily PRN Court Joy, PA-C      . OLANZapine zydis (ZYPREXA) disintegrating tablet 10 mg  10 mg Oral Q8H PRN Torah Pinnock      . propranolol (INDERAL) tablet 10 mg  10 mg Oral BID Court Joy, PA-C   10 mg at 07/10/13 5409  . simvastatin (ZOCOR) tablet 20 mg  20 mg Oral QPM Court Joy, PA-C   20 mg at 07/09/13 1702    Lab Results: No results found for this or any previous visit (from the past 48 hour(s)).  Physical Findings: AIMS: Facial and Oral Movements Muscles of Facial Expression: None, normal Lips and Perioral Area: None, normal Jaw: None, normal Tongue: None, normal,Extremity Movements Upper (arms, wrists, hands, fingers): None, normal Lower (legs, knees, ankles, toes): None, normal, Trunk Movements Neck, shoulders, hips: None, normal, Overall Severity Severity of abnormal movements (highest score from questions above): None, normal Incapacitation due to abnormal movements: None, normal Patient's awareness of abnormal movements (rate only patient's report): No Awareness, Dental Status Current problems with teeth and/or dentures?: No Does patient usually wear dentures?: No  CIWA:    COWS:     Treatment Plan Summary: Daily contact with patient to assess and evaluate symptoms and progress in treatment Medication management  Plan: Continue crisis management and stabilization.  Medication management: Reviewed with patient who stated no untoward effects. Patient to continue his  home medications of Depakote, Buspar, Prozac, and Haldol.  Decreased  Ativan 1 mg BID prn for anxiety.  Increase Depakote ER to 1000mg  po BID for agitation and mood lability Encouraged patient to attend groups and participate in group counseling sessions and activities.  Discharge plan in progress.  Continue current treatment plan.  Address health issues: Vitals  reviewed and stable.   Medical Decision Making Problem Points:  Established problem, slight improvement (1) and Review of psycho-social stressors (1) Data Points:  Review of medication regiment & side effects (2)  I certify that inpatient services furnished can reasonably be expected to improve the patient's condition.   Thedore Mins, MD 07/10/2013, 10:21 AM

## 2013-07-10 NOTE — Progress Notes (Signed)
Adult Psychoeducational Group Note  Date:  07/10/2013 Time:  8:51 PM  Group Topic/Focus:  Wrap-Up Group:   The focus of this group is to help patients review their daily goal of treatment and discuss progress on daily workbooks.  Participation Level:  Minimal  Participation Quality:  Appropriate  Affect:  Irritable  Cognitive:  Lacking  Insight: Limited  Engagement in Group:  Engaged  Modes of Intervention:  Support  Additional Comments:  Patient attended and participated in group tonight. He reports having a "crammy day". He did not sleep well last night. He was restless. Today he fell. He don't want his blood pressure to be taken. He refused to have his blood pressure taken. He don't want to be awaken to take his blood pressure. He stated if we wake him up, he will get up and hit the wall. This Clinical research associate asked him what would her accomplish by doing that. This Clinical research associate advised him to talk to his nurse about his concern and see what can be done.  Lita Mains [facility] 07/10/2013, 8:51 PM

## 2013-07-10 NOTE — Progress Notes (Signed)
Patient ID: [name], male   DOB: 19-Jan-1991, 22 y.o.   MRN: [id number] D: Pt. Walking around in room in the dark. Pt. Denies SHI, no concerns at this time. A: Writer explained that his vital signs would be taken every six hours according to post fall orders. Writer also re-educated client of fall prevention safety plan, encouraged to hold side rails when walking, stand slowly, and call for help as needed. Staff will monitor q64min for safety. R: Pt. Verbalized understanding fall safety plan. Pt. Is safe on the unit.

## 2013-07-11 MED ORDER — TRAZODONE HCL 150 MG PO TABS: 150.0000 mg | ORAL_TABLET | Freq: Every day | ORAL | Status: DC

## 2013-07-11 MED ORDER — HALOPERIDOL 10 MG PO TABS: 10.0000 mg | ORAL_TABLET | Freq: Two times a day (BID) | ORAL | Status: DC

## 2013-07-11 MED ORDER — FLUOXETINE HCL 60 MG PO TABS: 60.0000 mg | ORAL_TABLET | Freq: Every day | ORAL | Status: DC

## 2013-07-11 MED ORDER — BENZTROPINE MESYLATE 0.5 MG PO TABS: 0.5000 mg | ORAL_TABLET | Freq: Two times a day (BID) | ORAL | Status: DC

## 2013-07-11 MED ORDER — LEVOTHYROXINE SODIUM 25 MCG PO TABS: 100.0000 ug | ORAL_TABLET | Freq: Every day | ORAL | Status: DC

## 2013-07-11 MED ORDER — DIVALPROEX SODIUM 500 MG PO DR TAB: 1000.0000 mg | DELAYED_RELEASE_TABLET | Freq: Two times a day (BID) | ORAL | Status: DC

## 2013-07-11 MED ORDER — PROPRANOLOL HCL 10 MG PO TABS: 10.0000 mg | ORAL_TABLET | Freq: Two times a day (BID) | ORAL | Status: DC

## 2013-07-11 MED ORDER — FENOFIBRATE 160 MG PO TABS: 160.0000 mg | ORAL_TABLET | Freq: Every day | ORAL | Status: DC

## 2013-07-11 MED ORDER — HALOPERIDOL DECANOATE 50 MG/ML IM SOLN: 50.0000 mg | INTRAMUSCULAR | Status: DC

## 2013-07-11 MED ORDER — BUSPIRONE HCL 15 MG PO TABS: 15.0000 mg | ORAL_TABLET | Freq: Two times a day (BID) | ORAL | Status: DC

## 2013-07-11 MED ORDER — SIMVASTATIN 20 MG PO TABS: 20.0000 mg | ORAL_TABLET | Freq: Every evening | ORAL | Status: DC

## 2013-07-11 NOTE — Progress Notes (Signed)
[facility] Adult Case Management Discharge Plan :  Will you be returning to the same living situation after discharge: Yes,  ALF At discharge, do you have transportation home?:Yes,  ALF Do you have the ability to pay for your medications:Yes,  MCD  Release of information consent forms completed and in the chart;  Patient's signature needed at discharge.  Patient to Follow up at: Follow-up Information   Follow up with [facility] ACT team. (Will see you on Monday)    Contact information:   [address]  [place]  [phone]      Patient denies SI/HI:   Yes,  yes    Safety Planning and Suicide Prevention discussed:  Yes,  yes  Daryel Gerald B 07/11/2013, 11:49 AM

## 2013-07-11 NOTE — Progress Notes (Signed)
Patient ID: [name], male   DOB: Sep 03, 1991, 22 y.o.   MRN: [id number]  [facility] MD Progress Note  07/11/2013 10:46 AM JAKYLAN RON  MRN:  [id number] Subjective:  " I am doing fine, much better today."  Objective: Patient reports decreased mood swings, agitation  Anxiety and  Depressive symptoms. He denies auditory or visual  hallucinations. He reports improved motivation and energy level and started participating in the unit activities since yesterday. He is compliant with his medications and has not endorsed any adverse reactions. Diagnosis:   DSM5:  Axis I: Chronic Paranoid Schizophrenia Axis II: Cluster B Traits Axis III:  Past Medical History  Diagnosis Date  . Hypothyroidism   . Hypercholesterolemia   . Hypertension    Axis IV: other psychosocial or environmental problems, problems related to social environment and problems with primary support group Axis V: 50-60 moderate symptoms  ADL's:  Intact  Sleep: Fair  Appetite:  Good  Suicidal Ideation:  Passive SI to walk into traffic  Homicidal Ideation:  Denies AEB (as evidenced by):  Psychiatric Specialty Exam: Review of Systems  Constitutional: Negative.   HENT: Negative.   Eyes: Negative.   Respiratory: Negative.   Cardiovascular: Negative.   Gastrointestinal: Negative.   Genitourinary: Negative.   Musculoskeletal: Negative.   Skin: Negative.   Neurological: Negative.   Endo/Heme/Allergies: Negative.   Psychiatric/Behavioral: Positive for depression, suicidal ideas and hallucinations. Negative for memory loss. The patient is nervous/anxious.     Blood pressure 107/72, pulse 76, temperature 98.1 F (36.7 C), temperature source Oral, resp. rate 28, height 5\' 9"  (1.753 m), weight 127.007 kg (280 lb).Body mass index is 41.33 kg/(m^2).  General Appearance: fairly groomed  Patent attorney::  Minimal  Speech:  Slow  Volume:  Decreased  Mood:  Dysphoric  Affect:  Blunt  Thought Process:  Intact   Orientation:  Full (Time, Place, and Person)  Thought Content:  Hallucinations: Auditory  Suicidal Thoughts:  Yes.  with intent/plan  Homicidal Thoughts:  No  Memory:  Immediate;   Fair Recent;   Fair Remote;   Fair  Judgement:  marginal  Insight:  marginal  Psychomotor Activity:  Decreased  Concentration:  fair  Recall:  Fair  Akathisia:  No  Handed:  Right  AIMS (if indicated):     Assets:  Desire for Improvement Leisure Time Physical Health Resilience  Sleep:  Number of Hours: 6.75   Current Medications: Current Facility-Administered Medications  Medication Dose Route Frequency Provider Last Rate Last Dose  . acetaminophen (TYLENOL) tablet 650 mg  650 mg Oral Q4H PRN Court Joy, PA-C      . alum & mag hydroxide-simeth (MAALOX/MYLANTA) 200-200-20 MG/5ML suspension 30 mL  30 mL Oral PRN Court Joy, PA-C      . benztropine (COGENTIN) tablet 0.5 mg  0.5 mg Oral BID Cristianna Cyr   0.5 mg at 07/11/13 0817  . busPIRone (BUSPAR) tablet 15 mg  15 mg Oral BID Samuella Rasool   15 mg at 07/11/13 0817  . divalproex (DEPAKOTE) DR tablet 1,000 mg  1,000 mg Oral BID PC Corbett Moulder   1,000 mg at 07/11/13 0815  . fenofibrate tablet 160 mg  160 mg Oral Daily Court Joy, PA-C   160 mg at 07/11/13 0816  . FLUoxetine (PROZAC) tablet 60 mg  60 mg Oral Daily Court Joy, PA-C   60 mg at 07/11/13 0815  . haloperidol (HALDOL) tablet 10 mg  10 mg Oral BID Leonette Most  Peggye Fothergill, PA-C   10 mg at 07/11/13 0816  . ibuprofen (ADVIL,MOTRIN) tablet 400 mg  400 mg Oral Q8H PRN Court Joy, PA-C      . levothyroxine (SYNTHROID, LEVOTHROID) tablet 100 mcg  100 mcg Oral QAC breakfast Court Joy, PA-C   100 mcg at 07/11/13 1610  . LORazepam (ATIVAN) tablet 1 mg  1 mg Oral Q12H PRN Lorann Tani      . magnesium hydroxide (MILK OF MAGNESIA) suspension 30 mL  30 mL Oral Daily PRN Court Joy, PA-C      . nicotine (NICODERM CQ - dosed in mg/24 hours) patch 21 mg  21 mg Transdermal  Daily PRN Court Joy, PA-C      . OLANZapine zydis (ZYPREXA) disintegrating tablet 10 mg  10 mg Oral Q8H PRN Shulem Mader   10 mg at 07/10/13 2158  . propranolol (INDERAL) tablet 10 mg  10 mg Oral BID Court Joy, PA-C   10 mg at 07/11/13 0816  . simvastatin (ZOCOR) tablet 20 mg  20 mg Oral QPM Court Joy, PA-C   20 mg at 07/10/13 1713    Lab Results: No results found for this or any previous visit (from the past 48 hour(s)).  Physical Findings: AIMS: Facial and Oral Movements Muscles of Facial Expression: None, normal Lips and Perioral Area: None, normal Jaw: None, normal Tongue: None, normal,Extremity Movements Upper (arms, wrists, hands, fingers): None, normal Lower (legs, knees, ankles, toes): None, normal, Trunk Movements Neck, shoulders, hips: None, normal, Overall Severity Severity of abnormal movements (highest score from questions above): None, normal Incapacitation due to abnormal movements: None, normal Patient's awareness of abnormal movements (rate only patient's report): No Awareness, Dental Status Current problems with teeth and/or dentures?: No Does patient usually wear dentures?: No  CIWA:    COWS:     Treatment Plan Summary: Daily contact with patient to assess and evaluate symptoms and progress in treatment Medication management  Plan: Continue crisis management and stabilization.  Medication management: Reviewed with patient who stated no untoward effects. Patient to continue his  home medications of Depakote, Buspar, Prozac, and Haldol.   Continue Depakote ER to 1000mg  po BID for agitation and mood lability Encouraged patient to attend groups and participate in group counseling sessions and activities.  Discharge plan in progress.  Continue current treatment plan.  Address health issues: Vitals reviewed and stable.  Valproic acid level on 07/12/13  Medical Decision Making Problem Points:  Established problem,  improveming(1) and Review of  psycho-social stressors (1) Data Points:  Review of medication regiment & side effects (2)  I certify that inpatient services furnished can reasonably be expected to improve the patient's condition.   Thedore Mins, MD 07/11/2013, 10:46 AM

## 2013-07-11 NOTE — Progress Notes (Signed)
D: Patient's affect is appropriate to circumstance and mood is silly. Writer observed patient earlier this morning laughing and joking around with another peer on the unit. He continues to be compliant with medications, but still will not attend any of the groups held throughout the day. Patient currently in bed resting; no s/s of acute distress noted.  A: Support and encouragement provided to patient. Administered scheduled medications per ordering MD. Monitor Q15 minute checks for safety.  R: Patient receptive. Denies SI/HI. Patient remains safe.

## 2013-07-11 NOTE — Tx Team (Signed)
  Interdisciplinary Treatment Plan Update   Date Reviewed:  07/11/2013  Time Reviewed:  11:46 AM  Progress in Treatment:   Attending groups: Yes Participating in groups: Yes Taking medication as prescribed: Yes  Tolerating medication: Yes Family/Significant other contact made: Yes  Patient understands diagnosis: Yes  Discussing patient identified problems/goals with staff: Yes Medical problems stabilized or resolved: Yes Denies suicidal/homicidal ideation: Yes Patient has not harmed self or others: Yes  For review of initial/current patient goals, please see plan of care.  Estimated Length of Stay:  D/C tomorrow  Reason for Continuation of Hospitalization:   New Problems/Goals identified:  N/A  Discharge Plan or Barriers:   return to group home, follow up with ACT team  Additional Comments:  Attendees:  Signature: Thedore Mins, MD 07/11/2013 11:46 AM   Signature: Richelle Ito, [name] 07/11/2013 11:46 AM  Signature: Fransisca Kaufmann, NP 07/11/2013 11:46 AM  Signature: Joslyn Devon, RN 07/11/2013 11:46 AM  Signature: Liborio Nixon, RN 07/11/2013 11:46 AM  Signature:  07/11/2013 11:46 AM  Signature:   07/11/2013 11:46 AM  Signature:    Signature:    Signature:    Signature:    Signature:    Signature:      Scribe for Treatment Team:   Richelle Ito, [name]  07/11/2013 11:46 AM

## 2013-07-11 NOTE — [facility] Group Notes (Signed)
[facility] Group Notes:  (Counselor/Nursing/MHT/Case Management/Adjunct)  07/11/2013 1:15PM  Type of Therapy:  Group Therapy  Participation Level:  Active  Participation Quality:  Appropriate  Affect:  Flat  Cognitive:  Oriented  Insight:  Improving  Engagement in Group:  Limited  Engagement in Therapy:  Limited  Modes of Intervention:  Discussion, Exploration and Socialization  Summary of Progress/Problems: The topic for group was balance in life.  Pt participated in the discussion about when their life was in balance and out of balance and how this feels.  Pt discussed ways to get back in balance and short term goals they can work on to get where they want to be. [name] says that he always feels that out balance because of his mental health diagnosis.  Talked about angry he gets at the voices that never leave him alone.  [name] expressed worry that he will never find a girlfriend because "who would want to be with someone who is hearing voices?"  States that he finds balance by listening to music.   Daryel Gerald B 07/11/2013 1:18 PM

## 2013-07-11 NOTE — Progress Notes (Signed)
Patient ID: [name], male   DOB: 08-Feb-1991, 22 y.o.   MRN: [id number] D: pt. Lying in bed, refuses group. Pt. Isolates, minimum interaction. Pt. Seen rocking in room.  A: Writer assessed to make sure client wasn't trying to bang head against wall. Pt. Says "it's helps me to relax, I ain't dumb or nothing" Writer explained to client she was just concerned about his well being considering he was sitting close to the wall it could happen accidentally. Writer also encouraged pt. To attend karaoke.. Staff will monitor q1min for safety. R: Pt. Verbalized understanding writer's concern, but appears to be hard of processing information. Pt. Is safe on the unit, he did not attend group.

## 2013-07-12 DIAGNOSIS — F200 Paranoid schizophrenia: Principal | ICD-10-CM

## 2013-07-12 DIAGNOSIS — F1210 Cannabis abuse, uncomplicated: Secondary | ICD-10-CM

## 2013-07-12 LAB — VALPROIC ACID LEVEL: Valproic Acid Lvl: 65.5 ug/mL (ref 50.0–100.0)

## 2013-07-12 MED ORDER — HALOPERIDOL 2 MG PO TABS
ORAL_TABLET | ORAL | Status: AC
  Filled 2013-07-12: qty 1

## 2013-07-12 MED ORDER — HALOPERIDOL 2 MG PO TABS
2.0000 mg | ORAL_TABLET | Freq: Once | ORAL | Status: AC
  Administered 2013-07-12: 2 mg via ORAL

## 2013-07-12 NOTE — Progress Notes (Signed)
Patient ID: [name], male   DOB: 1991/01/21, 22 y.o.   MRN: [id number] Pt. Discharged per MD orders;  PT. Currently denies any HI/SI or AVH.  Pt. Was given education regarding follow up appointments and medications by RN.  Pt. Denies any questions or concerns about the medications.  Pt. Was escorted to the search room to retrieve his/her belongings by RN before being discharged to the hospital lobby.  Pt.'s ride waiting in lobby for transport back to group home, no acute distress noted.

## 2013-07-12 NOTE — [facility] Suicide Risk Assessment (Signed)
Suicide Risk Assessment  Discharge Assessment     Demographic Factors:  Male, Low socioeconomic status, Unemployed and lives in group home  Mental Status Per Nursing Assessment::   On Admission:   (denies)  Current Mental Status by Physician: patient denies suicidal ideation, intent or plan  Loss Factors: Financial problems/change in socioeconomic status  Historical Factors: Family history of mental illness or substance abuse and Impulsivity  Risk Reduction Factors:   Positive social support and Positive therapeutic relationship  Continued Clinical Symptoms:  Resolution of mood and psychotic symptoms  Cognitive Features That Contribute To Risk:  Closed-mindedness Polarized thinking    Suicide Risk:  Minimal: No identifiable suicidal ideation.  Patients presenting with no risk factors but with morbid ruminations; may be classified as minimal risk based on the severity of the depressive symptoms  Discharge Diagnoses:   AXIS I:  Paranoid schizophrenia, chronic condition with acute exacerbation              Cannabis use disorder AXIS II:  Deferred AXIS III:   Past Medical History  Diagnosis Date  . Schizophrenia   . Hypothyroidism   . Hypercholesterolemia   . Hypertension    AXIS IV:  housing problems, other psychosocial or environmental problems and problems related to social environment AXIS V:  61-70 mild symptoms  Plan Of Care/Follow-up recommendations:  Activity:  as tolerated Diet:  healthy Tests:  Valproic acid level: 65.5 Other:  patient to keep his after care appointment   Is patient on multiple antipsychotic therapies at discharge:  No   Has Patient had three or more failed trials of antipsychotic monotherapy by history:  No  Recommended Plan for Multiple Antipsychotic Therapies: NA  Wyatt Thorstenson,MD 07/12/2013, 9:44 AM

## 2013-07-12 NOTE — Progress Notes (Signed)
Pt did not attend karaoke group this evening. Pt was asleep.

## 2013-07-12 NOTE — [facility] Suicide Risk Assessment (Signed)
[facility] INPATIENT:  Family/Significant Other Suicide Prevention Education  Suicide Prevention Education:  Education Completed; [name], [phone] has been identified by the patient as the family member/significant other with whom the patient will be residing, and identified as the person(s) who will aid the patient in the event of a mental health crisis (suicidal ideations/suicide attempt).  With written consent from the patient, the family member/significant other has been provided the following suicide prevention education, prior to the and/or following the discharge of the patient.  The suicide prevention education provided includes the following:  Suicide risk factors  Suicide prevention and interventions  National Suicide Hotline telephone number  [facility] assessment telephone number  [facility] Emergency Assistance 911  [place] and/or Residential Mobile Crisis Unit telephone number  Request made of family/significant other to:  Remove weapons (e.g., guns, rifles, knives), all items previously/currently identified as safety concern.    Remove drugs/medications (over-the-counter, prescriptions, illicit drugs), all items previously/currently identified as a safety concern.  The family member/significant other verbalizes understanding of the suicide prevention education information provided.  The family member/significant other agrees to remove the items of safety concern listed above.  [name] 07/12/2013, 4:38 PM

## 2013-07-12 NOTE — Discharge Summary (Signed)
Physician Discharge Summary Note  Patient:  [name] is an 22 y.o., male MRN:  [id number] DOB:  [birth year] Patient phone:  [phone] (home)  Patient address:   [address] [place] [address]   Date of Admission:  07/06/2013 Date of Discharge: 07/12/13  Discharge Diagnoses: Principal Problem:   Paranoid schizophrenia, chronic condition with acute exacerbation Active Problems:   Marijuana abuse  Axis Diagnosis:  AXIS I: Paranoid schizophrenia, chronic condition with acute exacerbation  Cannabis use disorder  AXIS II: Deferred  AXIS III:  Past Medical History   Diagnosis  Date   .  Schizophrenia    .  Hypothyroidism    .  Hypercholesterolemia    .  Hypertension     AXIS IV: housing problems, other psychosocial or environmental problems and problems related to social environment  AXIS V: 61-70 mild symptoms  Level of Care:  OP  Hospital Course:   On admission: 22 y.o. male. Patient was brought to the emergency department by his family care home due to increased anxiety and hearing voices which were telling him to harm himself. He was seen by the ACT Team RN at [facility] today, who provided him with his injectable medications(Haldol). Because he stated he was hearing voices which were telling him to harm himself, the RN recommended he come to the ED for further evaluation. He was last seen by his psychiatrist on October 9, and did have some medication changes, but he is not able to determine what was changed or why. He states the voices have not told him specifically how to harm himself, but they are getting louder. He states he also is seeing "shadows" out of the corner of his eye and that just in the past few days. He states that he did smoke cannabis 2 days ago, stating he smoked one joint. He denies any other drugs or alcohol abuse. Patient reports that he is eating well, but he is waking up frequently at night, getting about 5 hours of sleep total. He reports he  is active, going to the [facility] on Mondays, participating in Group on Tuesday and participating in SA group on Wednesdays. He has a history of cutting on himself, and last cut on himself 3 months ago. He also has a past history of suicide attempts, overdosing on Klonodine which required him to be hospitalized. He has a family history of mental illness, including his mother, who has schizophrenia and bipolar disorder, and a sister with depression and Borderline Personality Disorder. He currently Lives at [facility] and can return to the home, however, he doesn't feel he can safely remain there and feels he may harm himself as the voices are telling him to do so. At this time, he can not contract for safety. Music helps with his voices and talking to people, stress makes his voices worse  While a patient in this hospital, TYMERE DEPUY was enrolled in group counseling and activities as well as received the following medication Current facility-administered medications:acetaminophen (TYLENOL) tablet 650 mg, 650 mg, Oral, Q4H PRN, Court Joy, PA-C;  alum & mag hydroxide-simeth (MAALOX/MYLANTA) 200-200-20 MG/5ML suspension 30 mL, 30 mL, Oral, PRN, Court Joy, PA-C;  benztropine (COGENTIN) tablet 0.5 mg, 0.5 mg, Oral, BID, Mojeed Akintayo, 0.5 mg at 07/12/13 0758;  busPIRone (BUSPAR) tablet 15 mg, 15 mg, Oral, BID, Mojeed Akintayo, 15 mg at 07/12/13 0800 divalproex (DEPAKOTE) DR tablet 1,000 mg, 1,000 mg, Oral, BID PC, Mojeed Akintayo, 1,000 mg at  07/12/13 0800;  fenofibrate tablet 160 mg, 160 mg, Oral, Daily, Court Joy, PA-C, 160 mg at 07/12/13 0758;  FLUoxetine (PROZAC) tablet 60 mg, 60 mg, Oral, Daily, Court Joy, PA-C, 60 mg at 07/12/13 0759;  haloperidol (HALDOL) tablet 10 mg, 10 mg, Oral, BID, Court Joy, PA-C, 10 mg at 07/12/13 0759 ibuprofen (ADVIL,MOTRIN) tablet 400 mg, 400 mg, Oral, Q8H PRN, Court Joy, PA-C;  levothyroxine (SYNTHROID, LEVOTHROID) tablet  100 mcg, 100 mcg, Oral, QAC breakfast, Court Joy, PA-C, 100 mcg at 07/12/13 [phone];  LORazepam (ATIVAN) tablet 1 mg, 1 mg, Oral, Q12H PRN, Mojeed Akintayo, 1 mg at 07/12/13 1000;  magnesium hydroxide (MILK OF MAGNESIA) suspension 30 mL, 30 mL, Oral, Daily PRN, Court Joy, PA-C nicotine (NICODERM CQ - dosed in mg/24 hours) patch 21 mg, 21 mg, Transdermal, Daily PRN, Court Joy, PA-C;  OLANZapine zydis (ZYPREXA) disintegrating tablet 10 mg, 10 mg, Oral, Q8H PRN, Mojeed Akintayo, 10 mg at 07/12/13 1412;  propranolol (INDERAL) tablet 10 mg, 10 mg, Oral, BID, Court Joy, PA-C, 10 mg at 07/12/13 0800;  simvastatin (ZOCOR) tablet 20 mg, 20 mg, Oral, QPM, Court Joy, PA-C, 20 mg at 07/11/13 1707 Patient's medications were adjusted by the MD. He was continued on Buspar 15 mg BID for anxiety. Patient was placed on Depakote 1,000 mg BID for improved stability of mood. Patient also on Prozac 60 mg daily for depression. Patient taking Haldol 10 mg BID for psychosis. Patient required redirection from staff due to attention seeking behaviors such as punching on the walls to his room and then stopping the behavior when staff entered the room. The patient required IM medications for agitation on at least two occasions. He continued to complain that he was hearing voices and his medications were adjusted to address the symptoms. The patient required firm limits from staff on his behavior to maintain to control. Patient was found stable to go back to his group home. Patient was sent home with prescriptions and sample medications. Patient attended treatment team meeting this am and met with treatment team members. Pt symptoms, treatment plan and response to treatment discussed. Oswald Hillock endorsed that their symptoms have improved. Pt also stated that they are stable for discharge.  In other to control Principal Problem:   Paranoid schizophrenia, chronic condition with acute exacerbation Active  Problems:   Marijuana abuse , they will continue psychiatric care on outpatient basis. They will follow-up at      Follow-up Information   Follow up with [facility] ACT team. (Will see you on Monday)    Contact information:   [address]  [place]  [phone]    .  In addition they were instructed to take all your medications as prescribed by your mental healthcare provider, to report any adverse effects and or reactions from your medicines to your outpatient provider promptly, patient is instructed and cautioned to not engage in alcohol and or illegal drug use while on prescription medicines, in the event of worsening symptoms, patient is instructed to call the crisis hotline, 911 and or go to the nearest ED for appropriate evaluation and treatment of symptoms.   Upon discharge, patient adamantly denies suicidal, homicidal ideations, auditory, visual hallucinations and or delusional thinking. They left [facility] with all personal belongings in no apparent distress.  Consults:  See electronic record for details  Significant Diagnostic Studies:  See electronic record for details  Discharge Vitals:   Blood pressure 128/86,  pulse 90, temperature 97.4 F (36.3 C), temperature source Oral, resp. rate 18, height 5\' 9"  (1.753 m), weight 127.007 kg (280 lb)..  Mental Status Exam: See Mental Status Examination and Suicide Risk Assessment completed by Attending Physician prior to discharge.  Discharge destination:  Home  Is patient on multiple antipsychotic therapies at discharge:  No  Has Patient had three or more failed trials of antipsychotic monotherapy by history: N/A Recommended Plan for Multiple Antipsychotic Therapies: N/A    Medication List    STOP taking these medications       QUEtiapine 50 MG tablet  Commonly known as:  SEROQUEL      TAKE these medications     Indication   benztropine 0.5 MG tablet  Commonly known as:  COGENTIN  Take 1 tablet (0.5 mg total) by mouth 2 (two)  times daily.   Indication:  Extrapyramidal Reaction caused by Medications     busPIRone 15 MG tablet  Commonly known as:  BUSPAR  Take 1 tablet (15 mg total) by mouth 2 (two) times daily.   Indication:  Aggressive Behavior, Anxiety Disorder, Depression     divalproex 500 MG DR tablet  Commonly known as:  DEPAKOTE  Take 2 tablets (1,000 mg total) by mouth 2 (two) times daily after a meal.   Indication:  Manic Phase of Manic-Depression     fenofibrate 160 MG tablet  Take 1 tablet (160 mg total) by mouth daily.   Indication:  Elevation of Both Cholesterol and Triglycerides in Blood     FLUoxetine HCl 60 MG Tabs  Take 60 mg by mouth daily.   Indication:  Depression     haloperidol 10 MG tablet  Commonly known as:  HALDOL  Take 1 tablet (10 mg total) by mouth 2 (two) times daily.   Indication:  Psychosis     haloperidol decanoate 50 MG/ML injection  Commonly known as:  HALDOL DECANOATE  Inject 1 mL (50 mg total) into the muscle every 28 (twenty-eight) days. For psychosis.   Indication:  Psychosis     levothyroxine 25 MCG tablet  Commonly known as:  SYNTHROID, LEVOTHROID  Take 4 tablets (100 mcg total) by mouth daily before breakfast.   Indication:  Underactive Thyroid     propranolol 10 MG tablet  Commonly known as:  INDERAL  Take 1 tablet (10 mg total) by mouth 2 (two) times daily.   Indication:  High Blood Pressure     simvastatin 20 MG tablet  Commonly known as:  ZOCOR  Take 1 tablet (20 mg total) by mouth every evening.   Indication:  Inherited Heterozygous Hypercholesterolemia     traZODone 150 MG tablet  Commonly known as:  DESYREL  Take 1 tablet (150 mg total) by mouth at bedtime.   Indication:  Trouble Sleeping       Follow-up Information   Follow up with [facility] ACT team. (Will see you on Monday)    Contact information:   [address]  [place]  [phone]     Follow-up recommendations:   Activities: Resume typical activities Diet: Resume typical  diet Tests: none Other: Follow up with outpatient provider and report any side effects to out patient prescriber.  Comments:  Take all your medications as prescribed by your mental healthcare provider. Report any adverse effects and or reactions from your medicines to your outpatient provider promptly. Patient is instructed and cautioned to not engage in alcohol and or illegal drug use while on prescription medicines.  In the event of worsening symptoms, patient is instructed to call the crisis hotline, 911 and or go to the nearest ED for appropriate evaluation and treatment of symptoms. Follow-up with your primary care provider for your other medical issues, concerns and or health care needs.  SignedFransisca Kaufmann NP-C 07/12/2013 3:25 PM

## 2013-07-12 NOTE — Progress Notes (Signed)
Recreation Therapy Notes  Date: 10.24.2014 Time: 9:30am Location: [address] Dayroom   Group Topic: Decision Making  Goal Area(s) Addresses:  Patient will verbalize benefit of using good decision making skills. Patient will identify method of good decision making.  Behavioral Response: Did not attend  Jearl Klinefelter, LRT/CTRS  Jearl Klinefelter 07/12/2013 12:11 PM

## 2013-07-12 NOTE — [facility] Group Notes (Signed)
[facility] [name] Group Therapy  07/12/2013 3:18 PM  Type of Therapy:  Group Therapy   Participation Level:  Active  Participation Quality:  Appropriate  Affect:  Flat  Cognitive:  Appropriate  Insight:  Engaged  Engagement in Therapy:  Engaged  Modes of Intervention:  Discussion and Socialization   Summary of Progress/Problems:  Lead Child psychotherapist and Warden/ranger was here to lead a group on themes of hope and courage.  [name] stated that he feel hopeless because of his mental disorder.  The voices in his head can be too much for him to bare.  He described a hopeless time when he was first placed in the group home and away from his family.  He has been struggling with being at the group home for 3 1/2 years.  He was able to recognize certain behaviors such as angry and frustration when he does not take his medication.     [name]   07/12/2013  3:18 PM

## 2013-07-12 NOTE — [facility] Group Notes (Signed)
[facility] [name] Group Therapy  07/12/2013 9:21 AM  Type of Therapy:  Group Therapy  Participation Level:  Did Not Attend  [name] was asleep in bed and refused to come to group. His aftercare is already arranged with ALF/group home return and he will follow up with [facility] for medication.    Nail, Catalina Gravel 07/12/2013, 9:21 AM

## 2013-07-16 NOTE — Discharge Summary (Signed)
Seen and agreed. Jhostin Epps, MD 

## 2013-07-17 NOTE — Progress Notes (Signed)
Patient Discharge Instructions:  After Visit Summary (AVS):   Faxed to:  07/17/13 Discharge Summary Note:   Faxed to:  07/17/13 Psychiatric Admission Assessment Note:   Faxed to:  07/17/13 Suicide Risk Assessment - Discharge Assessment:   Faxed to:  07/17/13 Faxed/Sent to the Next Level Care provider:  07/17/13 Faxed to [facility] @ [phone]  Jerelene Redden, 07/17/2013, 3:41 PM

## 2014-02-28 ENCOUNTER — Encounter (HOSPITAL_COMMUNITY): Payer: Self-pay | Admitting: Emergency Medicine

## 2014-02-28 ENCOUNTER — Emergency Department (HOSPITAL_COMMUNITY)
Admission: EM | Admit: 2014-02-28 | Discharge: 2014-03-01 | Disposition: A | Discharge status: Home / Self Care | Payer: Federal, State, Local not specified - PPO | Attending: Emergency Medicine | Admitting: Emergency Medicine

## 2014-02-28 DIAGNOSIS — F17200 Nicotine dependence, unspecified, uncomplicated: Secondary | ICD-10-CM | POA: Diagnosis not present

## 2014-02-28 DIAGNOSIS — Z79899 Other long term (current) drug therapy: Secondary | ICD-10-CM | POA: Diagnosis not present

## 2014-02-28 DIAGNOSIS — E039 Hypothyroidism, unspecified: Secondary | ICD-10-CM | POA: Insufficient documentation

## 2014-02-28 DIAGNOSIS — F3289 Other specified depressive episodes: Secondary | ICD-10-CM | POA: Insufficient documentation

## 2014-02-28 DIAGNOSIS — E7800 Pure hypercholesterolemia, unspecified: Secondary | ICD-10-CM | POA: Diagnosis not present

## 2014-02-28 DIAGNOSIS — I10 Essential (primary) hypertension: Secondary | ICD-10-CM | POA: Insufficient documentation

## 2014-02-28 DIAGNOSIS — F209 Schizophrenia, unspecified: Secondary | ICD-10-CM

## 2014-02-28 DIAGNOSIS — F329 Major depressive disorder, single episode, unspecified: Secondary | ICD-10-CM | POA: Insufficient documentation

## 2014-02-28 DIAGNOSIS — F200 Paranoid schizophrenia: Secondary | ICD-10-CM | POA: Insufficient documentation

## 2014-02-28 DIAGNOSIS — Z046 Encounter for general psychiatric examination, requested by authority: Secondary | ICD-10-CM | POA: Diagnosis present

## 2014-02-28 LAB — BASIC METABOLIC PANEL
BUN: 13 mg/dL (ref 6–23)
CO2: 23 meq/L (ref 19–32)
CREATININE: 0.9 mg/dL (ref 0.50–1.35)
Calcium: 9 mg/dL (ref 8.4–10.5)
Chloride: 102 mEq/L (ref 96–112)
GFR calc Af Amer: >90 mL/min (ref >90)
GFR calc non Af Amer: >90 mL/min (ref >90)
Glucose, Bld: 149 mg/dL — ABNORMAL HIGH (ref 70–99)
Potassium: 4.1 mEq/L (ref 3.7–5.3)
Sodium: 138 mEq/L (ref 137–147)

## 2014-02-28 LAB — CBC WITH DIFFERENTIAL/PLATELET
BASOS ABS: 0 10*3/uL (ref 0.0–0.1)
Basophils Relative: 0 % (ref 0–1)
EOS ABS: 0.2 10*3/uL (ref 0.0–0.7)
EOS PCT: 3 % (ref 0–5)
HCT: 41.2 % (ref 39.0–52.0)
Hemoglobin: 14.2 g/dL (ref 13.0–17.0)
LYMPHS ABS: 2.2 10*3/uL (ref 0.7–4.0)
Lymphocytes Relative: 35 % (ref 12–46)
MCH: 31.7 pg (ref 26.0–34.0)
MCHC: 34.5 g/dL (ref 30.0–36.0)
MCV: 92 fL (ref 78.0–100.0)
Monocytes Absolute: 0.5 10*3/uL (ref 0.1–1.0)
Monocytes Relative: 8 % (ref 3–12)
Neutro Abs: 3.4 10*3/uL (ref 1.7–7.7)
Neutrophils Relative %: 55 % (ref 43–77)
Platelets: 217 10*3/uL (ref 150–400)
RBC: 4.48 MIL/uL (ref 4.22–5.81)
RDW: 12.5 % (ref 11.5–15.5)
WBC: 6.2 10*3/uL (ref 4.0–10.5)

## 2014-02-28 LAB — RAPID URINE DRUG SCREEN, HOSP PERFORMED
AMPHETAMINES: NOT DETECTED
BENZODIAZEPINES: NOT DETECTED
Barbiturates: NOT DETECTED
Cocaine: NOT DETECTED
OPIATES: NOT DETECTED
Tetrahydrocannabinol: POSITIVE — AB

## 2014-02-28 LAB — ETHANOL: Alcohol, Ethyl (B): <11 mg/dL (ref 0–11)

## 2014-02-28 MED ORDER — LORAZEPAM 1 MG PO TABS
ORAL_TABLET | ORAL | Status: AC
  Filled 2014-02-28: qty 1

## 2014-02-28 MED ORDER — LORAZEPAM 1 MG PO TABS
1.0000 mg | ORAL_TABLET | Freq: Once | ORAL | Status: AC
  Administered 2014-02-28: 1 mg via ORAL

## 2014-02-28 NOTE — ED Notes (Addendum)
[facility], says he is suicidal.  Pt is alert, cooperative at  Triage.  Wanded by security

## 2014-02-28 NOTE — BH Assessment (Signed)
Keneisha Herbin AC confirmed there is bed availability on 400. Consulted with Alberteen SamFran Hobson NP, who accepted PT to the service of Dr. Jannifer FranklinAkintayo, bed 401 bed 2. Called Dr. Adriana Simasook who accepted Drenda FreezeFran Hobson's recommendation for admittance to [facility] for crisis stabilization.  Spoke to Solectron CorporationJamie RN to notify of admission.   Clista BernhardtNancy Tivon Lemoine, [facility] Triage Specialist 02/28/2014 10:26 PM

## 2014-02-28 NOTE — ED Notes (Signed)
Pt. States he is starting to hear voices again and is feeling anxious. EDP aware, ativan ordered.

## 2014-02-28 NOTE — ED Notes (Signed)
[place], states he is hearing voices and having thoughts of suicidal ideation without plan. No homicidal thoughts, no visual hallucinations. States that he may need a change in his meds, but his doctor is out of town right now.

## 2014-02-28 NOTE — BH Assessment (Addendum)
Tele Assessment Note   [name] is an 23 y.o. single white male with a history of schizophrenia, and a family history of mental health problems. Pt presents voluntarily to the ED due to not feeling like he could trust himself. He reports that he has been having suicidal ideation, and did not feel he could keep himself safe. Pt alerted [facility] staff at [facility] where he is a resident, and was brought to the ED. Pt reports increased anxious and depressed feelings the past 3 days accompanied by auditory and visual hallucinations. Pt reports he hears people talking in his head but he cannot make out what they are saying. Pt denies commands at this time, and notes the voices cause increased anxiousness and irritability for him. Pt reports last night he had visual hallucinations that two men were in his room wanting to beat him up. Pt reports he has been feeling hopeless about his mental health dx for some time now and wanted to come to the hospital to see if he could have his medications reviewed and so he could participate in treatment. "I want to nip this in the bud."  While Pt reports he has been having SI the past few days he denies any specific plans. He noted he could find something to cut himself with. Pt has a hx of cutting behaviors, and stated he felt the urge to cut again last night. Pt indicated he has not cut in about a year. Pt denies other self-injurious behaviors. Pt denies any current or hx of HI. Pt has had 1 suicide attempt by OD in 2009.  [facility] with Dr. Rosalia Hammersay, and out-patient services with the ACT Team every other week. Pt reports he takes his medications as prescribed. [facility], and reports no conflicts with residence or staff, but does report it is boring causing him to have too much time to think which increases feelings of anxiety, which then seem to trigger his voices. He reports  he gets agitated and restless when feeling anxious.  Pt reports he has been drinking 1 -2 beers per day since turning 23, but indicated he has not used in about a week. Patient reports infrequent current use of marijuana, sharing a blunt with others when they can afford it. He states no use of marijuana in 3 to 4 weeks. He denies any other drug use.    Axis I: 295.90 Schizophrenia, Continuous Axis II: deferred Axis III: Hx of hypothyroidism, hyper cholesterolemia, hypertension Axis IV: Father going through divorce Axis V: 35  Past Medical History:  Past Medical History  Diagnosis Date  . Schizophrenia   . Hypothyroidism   . Hypercholesterolemia   . Hypertension     Past Surgical History  Procedure Laterality Date  . Tonsillectomy    . Cardiac surgery      for Epstein's abnormality at 23yo    Family History: Family History of Mental illness including his mother who has schizophrenia, and bipolar disorder, and a sister with with depression and Borderline Personality Disorder.   Social History:  reports that he has been smoking Cigarettes.  He has been smoking about 1.00 pack per day. He does not have any smokeless tobacco history on file. He reports that he drinks alcohol. He reports that he uses illicit drugs (Marijuana).  Additional Social History:  Alcohol / Drug Use Pain Medications: denies Prescriptions: reports takes only as prescribed  Over  the Counter: milk of magnesium  History of alcohol / drug use?: Yes Longest period of sobriety (when/how long): about a week Substance #1 Name of Substance 1: alcohol 1 - Age of First Use: 23 1 - Amount (size/oz): 1-2 beers  1 - Frequency: Pt reports drinking 1 -2 beers daily since turning 23, but denies any use in the past week 1 - Duration: about 2 months 1 - Last Use / Amount: about a week ago Substance #2 Name of Substance 2: marijuana 2 - Age of First Use: unknown 2 - Amount (size/oz): shares a blunt with others, reports 3-4  hits at a time 2 - Frequency: Pt reports infrequent use because he does not have money to purchase marijuana 2 - Duration: unknown 2 - Last Use / Amount: 3 - 4 weeks ago  CIWA: CIWA-Ar BP: 139/77 mmHg Pulse Rate: 84 COWS:    Allergies: No Known Allergies  Home Medications:  (Not in a hospital admission)  OB/GYN Status:  No LMP for male patient.  General Assessment Data Location of Assessment: [facility] Is this a Tele or Face-to-Face Assessment?: Tele Assessment Is this an Initial Assessment or a Re-assessment for this encounter?: Initial Assessment Living Arrangements:  ([facility]) Can pt return to current living arrangement?: Yes Admission Status: Voluntary Is patient capable of signing voluntary admission?: Yes Transfer from:  [facility] [facility]) Referral Source: Self/Family/Friend  ArtistMedical Screening Exam ([facility] Walk-in ONLY) Medical Exam completed: Yes  [facility] Living Arrangements:  ([facility]) Name of Psychiatrist:  Patent examiner([facility]) Name of Therapist: [facility] team [facility]  Education Status Is patient currently in school?: No  Risk to self Suicidal Ideation: Yes-Currently Present Suicidal Intent: No (Told group home staff he did not trust himself due to SI) Is patient at risk for suicide?: Yes (Not trusting himself due to SI but denies plan or intent) Suicidal Plan?: No Access to Means: Yes Specify Access to Suicidal Means: Pt reports "I can find something to cut myself with" What has been your use of drugs/alcohol within the last 12 months?: Pt reports he has been drinking 1 -2 beers daily since turning 23 two months ago. Pt reports he uses marijuana when he can get it. He shares with friends taking 3 -4 hits infrequently due to lack of access Previous Attempts/Gestures: Yes (2009 attemtped to OD on prescribed medication) How many times?: 1 (2009 attempted to OD) Other Self Harm Risks: history of cutting behaviors, Pt reports he has not cut in " a  long time" indicating it was more than a year ago. In October 2014 he indicated his last cutting had been three months previously Triggers for Past Attempts: Other (Comment) (PT reports he did not like being at wilderness camp ) Intentional Self Injurious Behavior: Cutting (Pt indicated he felt urge to cut tonight, hx of cutting) Comment - Self Injurious Behavior: Reports he has not cut in a long time, in October of 2014 he reported that he had not cut for three months Family Suicide History: Yes Recent stressful life event(s): Other (Comment) (Father and Step mtr separating, client worried about dad) Persecutory voices/beliefs?: No Depression: Yes Depression Symptoms: Despondent;Tearfulness;Isolating;Feeling angry/irritable (reports feeling hopeless about his mental health dx) Substance abuse history and/or treatment for substance abuse?: Yes Suicide prevention information given to non-admitted patients: Not applicable ([facility]  Risk to Others Homicidal Ideation: No Thoughts of Harm to Others: No Current Homicidal Intent: No Current Homicidal Plan: No Access to  Homicidal Means: No Identified Victim: none History of harm to others?: No Assessment of Violence: None Noted Violent Behavior Description: none Does patient have access to weapons?: No Criminal Charges Pending?: No Does patient have a court date: No  Psychosis Hallucinations: Auditory (reports he hears voices, but they are not clear) Delusions: None noted  Mental Status Report Appear/Hygiene: In hospital gown;Unremarkable Eye Contact: Good Motor Activity: Unremarkable Speech: Unremarkable (coherent, logical ) Level of Consciousness: Alert Mood: Depressed;Anxious Affect: Flat Anxiety Level: Moderate Thought Processes: Coherent;Relevant Judgement: Unimpaired Orientation: Person;Place;Time;Situation Obsessive Compulsive Thoughts/Behaviors: None  Cognitive Functioning Concentration: Normal Memory:  Recent Intact;Remote Intact IQ: Average Insight: Good Impulse Control: Good Appetite: Good Weight Loss: 0 Weight Gain: 0 Sleep: No Change Total Hours of Sleep: 9 (reports sleep is restful, and medication helps him sleep) Vegetative Symptoms: None  ADLScreening [facility] Assessment Services) Patient's cognitive ability adequate to safely complete daily activities?: Yes Patient able to express need for assistance with ADLs?: Yes Independently performs ADLs?: Yes (appropriate for developmental age)  Prior Inpatient Therapy Prior Inpatient Therapy: Yes (Oct. 2014 [facility], June 2014 [facility], April 2014 [facility], [facility] [place] Prior Therapy Dates: April 2014, June 2014, October 2014 Prior Therapy Facilty/Provider(s): [facility], [facility], [place], [place] Reason for Treatment: OD, substance Abuse, SI, command hallucinations  Prior Outpatient Therapy Prior Outpatient Therapy: Yes Prior Therapy Dates: current Prior Therapy Facilty/Provider(s): [facility], ACT Team Reason for Treatment: outpatient, med management [facility]  ADL Screening (condition at time of admission) Patient's cognitive ability adequate to safely complete daily activities?: Yes Patient able to express need for assistance with ADLs?: Yes Independently performs ADLs?: Yes (appropriate for developmental age)  Home Assistive Devices/Equipment Home Assistive Devices/Equipment: None    Abuse/Neglect Assessment (Assessment to be complete while patient is alone) Verbal Abuse: Denies Sexual Abuse: Denies Exploitation of patient/patient's resources: Denies Self-Neglect: Denies Values / Beliefs Cultural Requests During Hospitalization: None Spiritual Requests During Hospitalization: None   Advance Directives (For Healthcare) Advance Directive: Patient does not have advance directive Pre-existing out of facility DNR order (yellow form or pink MOST form): No Nutrition Screen- [facility] Adult/[facility] Patient's home diet: Regular  Additional Information 1:1 In Past  12 Months?: No CIRT Risk: No Elopement Risk: No Does patient have medical clearance?: Yes     Disposition:  Keneisha Herbin AC confirmed there is bed availability on 400. Consulted with Alberteen Sam NP, who accepted PT to the service of Dr. Jannifer Franklin, bed 401 bed 2. Called Dr. Adriana Simas who accepted Drenda Freeze Hobson's recommendation for admittance to [facility] for crisis stabilization.  Spoke to [employer] to notify of admission.   Clista Bernhardt, [facility] Triage Specialist 03/01/2014 2:02 AM  [name] 02/28/2014 11:44 PM
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
## 2014-02-28 NOTE — BH Assessment (Signed)
Spoke with Dr. Adriana Simasook who reports PT seems lucid, but Pt indicates he is hearing voices, and reports loose SI, but no real planning. Pt reports he wants to "nip it in the bud." Dr. Adriana Simasook requests assessment for recommendation for level of care.  Clista BernhardtNancy Yaslene Lindamood, [facility] Triage Specialist 02/28/2014 9:52 PM

## 2014-03-01 ENCOUNTER — Encounter (HOSPITAL_COMMUNITY): Payer: Self-pay | Admitting: *Deleted

## 2014-03-01 ENCOUNTER — Inpatient Hospital Stay (HOSPITAL_COMMUNITY)
Admission: AD | Admit: 2014-03-01 | Discharge: 2014-03-07 | DRG: 885 | Disposition: A | Discharge status: Home / Self Care | Payer: Federal, State, Local not specified - PPO | Source: Intra-hospital | Attending: Psychiatry | Admitting: Psychiatry

## 2014-03-01 DIAGNOSIS — F1210 Cannabis abuse, uncomplicated: Secondary | ICD-10-CM | POA: Diagnosis present

## 2014-03-01 DIAGNOSIS — I10 Essential (primary) hypertension: Secondary | ICD-10-CM | POA: Diagnosis present

## 2014-03-01 DIAGNOSIS — E7800 Pure hypercholesterolemia, unspecified: Secondary | ICD-10-CM | POA: Diagnosis present

## 2014-03-01 DIAGNOSIS — F200 Paranoid schizophrenia: Secondary | ICD-10-CM | POA: Diagnosis present

## 2014-03-01 DIAGNOSIS — E039 Hypothyroidism, unspecified: Secondary | ICD-10-CM | POA: Diagnosis present

## 2014-03-01 DIAGNOSIS — F411 Generalized anxiety disorder: Secondary | ICD-10-CM | POA: Diagnosis present

## 2014-03-01 DIAGNOSIS — F259 Schizoaffective disorder, unspecified: Principal | ICD-10-CM | POA: Diagnosis present

## 2014-03-01 DIAGNOSIS — F17200 Nicotine dependence, unspecified, uncomplicated: Secondary | ICD-10-CM | POA: Diagnosis present

## 2014-03-01 HISTORY — DX: Depression, unspecified: F32.A

## 2014-03-01 HISTORY — DX: Major depressive disorder, single episode, unspecified: F32.9

## 2014-03-01 MED ORDER — DIVALPROEX SODIUM 500 MG PO DR TAB
2000.0000 mg | DELAYED_RELEASE_TABLET | Freq: Every day | ORAL | Status: DC
  Administered 2014-03-01 – 2014-03-06 (×6): 2000 mg via ORAL
  Filled 2014-03-01 (×3): qty 4
  Filled 2014-03-01: qty 8
  Filled 2014-03-01 (×3): qty 4
  Filled 2014-03-01: qty 8
  Filled 2014-03-01: qty 4

## 2014-03-01 MED ORDER — LORAZEPAM 2 MG/ML IJ SOLN
INTRAMUSCULAR | Status: AC
  Administered 2014-03-01: 2 mg via INTRAMUSCULAR
  Filled 2014-03-01: qty 1

## 2014-03-01 MED ORDER — FENOFIBRATE 160 MG PO TABS
160.0000 mg | ORAL_TABLET | Freq: Every morning | ORAL | Status: DC
  Administered 2014-03-01 – 2014-03-07 (×7): 160 mg via ORAL
  Filled 2014-03-01 (×9): qty 1

## 2014-03-01 MED ORDER — FLUOXETINE HCL 20 MG PO CAPS
60.0000 mg | ORAL_CAPSULE | Freq: Every morning | ORAL | Status: DC
  Administered 2014-03-01 – 2014-03-07 (×7): 60 mg via ORAL
  Filled 2014-03-01: qty 3
  Filled 2014-03-01: qty 6
  Filled 2014-03-01 (×2): qty 3
  Filled 2014-03-01: qty 6
  Filled 2014-03-01 (×5): qty 3

## 2014-03-01 MED ORDER — HALOPERIDOL LACTATE 5 MG/ML IJ SOLN
10.0000 mg | Freq: Once | INTRAMUSCULAR | Status: AC
  Administered 2014-03-01: 10 mg via INTRAMUSCULAR

## 2014-03-01 MED ORDER — HALOPERIDOL 5 MG PO TABS: 10.0000 mg | ORAL_TABLET | Freq: Once | ORAL | Status: AC

## 2014-03-01 MED ORDER — LORAZEPAM 2 MG/ML IJ SOLN
2.0000 mg | Freq: Once | INTRAMUSCULAR | Status: AC
  Administered 2014-03-01: 2 mg via INTRAMUSCULAR

## 2014-03-01 MED ORDER — ACETAMINOPHEN 325 MG PO TABS: 650.0000 mg | ORAL_TABLET | Freq: Four times a day (QID) | ORAL | Status: DC | PRN

## 2014-03-01 MED ORDER — DIPHENHYDRAMINE HCL 50 MG/ML IJ SOLN
INTRAMUSCULAR | Status: AC
  Filled 2014-03-01: qty 1

## 2014-03-01 MED ORDER — HALOPERIDOL LACTATE 5 MG/ML IJ SOLN
INTRAMUSCULAR | Status: AC
  Administered 2014-03-01: 10 mg via INTRAMUSCULAR
  Filled 2014-03-01: qty 1

## 2014-03-01 MED ORDER — PROPRANOLOL HCL 10 MG PO TABS
10.0000 mg | ORAL_TABLET | Freq: Two times a day (BID) | ORAL | Status: DC
  Administered 2014-03-01 – 2014-03-07 (×13): 10 mg via ORAL
  Filled 2014-03-01 (×3): qty 1
  Filled 2014-03-01: qty 4
  Filled 2014-03-01 (×3): qty 1
  Filled 2014-03-01: qty 4
  Filled 2014-03-01 (×3): qty 1
  Filled 2014-03-01 (×2): qty 4
  Filled 2014-03-01 (×6): qty 1

## 2014-03-01 MED ORDER — MAGNESIUM HYDROXIDE 400 MG/5ML PO SUSP
30.0000 mL | Freq: Every day | ORAL | Status: DC | PRN
  Administered 2014-03-02 – 2014-03-04 (×2): 30 mL via ORAL

## 2014-03-01 MED ORDER — SIMVASTATIN 20 MG PO TABS
20.0000 mg | ORAL_TABLET | Freq: Every day | ORAL | Status: DC
  Administered 2014-03-01 – 2014-03-06 (×6): 20 mg via ORAL
  Filled 2014-03-01 (×8): qty 1

## 2014-03-01 MED ORDER — LEVOTHYROXINE SODIUM 100 MCG PO TABS
100.0000 ug | ORAL_TABLET | Freq: Every day | ORAL | Status: DC
  Administered 2014-03-01 – 2014-03-07 (×7): 100 ug via ORAL
  Filled 2014-03-01 (×9): qty 1

## 2014-03-01 MED ORDER — TRAZODONE HCL 150 MG PO TABS
150.0000 mg | ORAL_TABLET | Freq: Every evening | ORAL | Status: DC | PRN
  Administered 2014-03-01 – 2014-03-06 (×4): 150 mg via ORAL
  Filled 2014-03-01 (×4): qty 1

## 2014-03-01 MED ORDER — DIPHENHYDRAMINE HCL 50 MG/ML IJ SOLN
50.0000 mg | Freq: Once | INTRAMUSCULAR | Status: AC
  Administered 2014-03-01: 50 mg via INTRAMUSCULAR

## 2014-03-01 MED ORDER — ALUM & MAG HYDROXIDE-SIMETH 200-200-20 MG/5ML PO SUSP
30.0000 mL | ORAL | Status: DC | PRN
  Administered 2014-03-06 – 2014-03-07 (×3): 30 mL via ORAL

## 2014-03-01 MED ORDER — BENZTROPINE MESYLATE 1 MG PO TABS
1.0000 mg | ORAL_TABLET | Freq: Every day | ORAL | Status: DC
  Administered 2014-03-01 – 2014-03-02 (×2): 1 mg via ORAL
  Filled 2014-03-01 (×3): qty 1

## 2014-03-01 MED ORDER — BUSPIRONE HCL 15 MG PO TABS
15.0000 mg | ORAL_TABLET | Freq: Three times a day (TID) | ORAL | Status: DC
  Administered 2014-03-01 – 2014-03-07 (×20): 15 mg via ORAL
  Filled 2014-03-01: qty 1
  Filled 2014-03-01 (×2): qty 6
  Filled 2014-03-01 (×3): qty 1
  Filled 2014-03-01: qty 6
  Filled 2014-03-01: qty 1
  Filled 2014-03-01: qty 6
  Filled 2014-03-01: qty 1
  Filled 2014-03-01: qty 6
  Filled 2014-03-01 (×14): qty 1
  Filled 2014-03-01: qty 6
  Filled 2014-03-01 (×3): qty 1

## 2014-03-01 MED ORDER — HALOPERIDOL 5 MG PO TABS
20.0000 mg | ORAL_TABLET | Freq: Every day | ORAL | Status: DC
  Administered 2014-03-01 – 2014-03-02 (×2): 20 mg via ORAL
  Filled 2014-03-01 (×3): qty 4

## 2014-03-01 MED ORDER — HALOPERIDOL LACTATE 5 MG/ML IJ SOLN
INTRAMUSCULAR | Status: AC
  Filled 2014-03-01: qty 1

## 2014-03-01 NOTE — Progress Notes (Signed)
[facility] Group Notes:  (Nursing/MHT/Case Management/Adjunct)  Date:  03/01/2014  Time:  9:38 PM  Type of Therapy:  Psychoeducational Skills  Participation Level:  Active  Participation Quality:  Attentive  Affect:  Angry  Cognitive:  Appropriate  Insight:  Lacking  Engagement in Group:  Resistant  Modes of Intervention:  Education  Summary of Progress/Problems: The patient described his day as having been "pretty rough". He indicated that he had "schizophrenic episodes" today and that he continued to hear voices. He also mentioned that speaking with his nurse helped quite a bit. He described the voices as "chatter". As a theme for the day, his support system will consist of the group home, [facility], and [facility].   Hazle CocaGOODMAN, Emmilynn Marut S 03/01/2014, 9:38 PM

## 2014-03-01 NOTE — [facility] Suicide Risk Assessment (Signed)
   Nursing information obtained from:    Demographic factors:    Current Mental Status:    Loss Factors:    Historical Factors:    Risk Reduction Factors:    Total Time spent with patient: 30 minutes  CLINICAL FACTORS:   Severe Anxiety and/or Agitation Depression:   Anhedonia Comorbid alcohol abuse/dependence Delusional Hopelessness Impulsivity Insomnia Alcohol/Substance Abuse/Dependencies Schizophrenia:   Less than 565 years old Paranoid or undifferentiated type More than one psychiatric diagnosis Currently Psychotic Unstable or Poor Therapeutic Relationship  Psychiatric Specialty Exam: Physical Exam  Psychiatric: His speech is normal. His mood appears anxious. He is actively hallucinating. Cognition and memory are normal. He expresses impulsivity. He exhibits a depressed mood. He expresses suicidal ideation.    Review of Systems  Constitutional: Negative.   HENT: Negative.   Eyes: Negative.   Respiratory: Negative.   Cardiovascular: Negative.   Gastrointestinal: Negative.   Genitourinary: Negative.   Musculoskeletal: Negative.   Skin: Negative.   Neurological: Negative.   Endo/Heme/Allergies: Negative.   Psychiatric/Behavioral: Positive for depression, suicidal ideas, hallucinations and substance abuse. The patient is nervous/anxious and has insomnia.     Blood pressure 143/96, pulse 72, temperature 97.5 F (36.4 C), temperature source Oral, resp. rate 18, height 5\' 9"  (1.753 m), weight 123.378 kg (272 lb).Body mass index is 40.15 kg/(m^2).  General Appearance: Disheveled  Eye Contact::  Minimal  Speech:  Clear and Coherent  Volume:  Decreased  Mood:  Anxious and Depressed  Affect:  Non-Congruent  Thought Process:  Circumstantial and Disorganized  Orientation:  Full (Time, Place, and Person)  Thought Content:  Hallucinations: Auditory  Suicidal Thoughts:  Yes.  without intent/plan  Homicidal Thoughts:  No  Memory:  Immediate;   Fair Recent;   Fair Remote;    Fair  Judgement:  Impaired  Insight:  Lacking  Psychomotor Activity:  Decreased  Concentration:  Fair  Recall:  [employer]  Fund of Knowledge:Fair  Language: Fair  Akathisia:  No  Handed:  Right  AIMS (if indicated):     Assets:  Communication Skills Desire for Improvement Physical Health  Sleep:  Number of Hours: 3.25   Musculoskeletal: Strength & Muscle Tone: within normal limits Gait & Station: normal Patient leans: N/A  COGNITIVE FEATURES THAT CONTRIBUTE TO RISK:  Closed-mindedness    SUICIDE RISK:   Minimal: No identifiable suicidal ideation.  Patients presenting with no risk factors but with morbid ruminations; may be classified as minimal risk based on the severity of the depressive symptoms  PLAN OF CARE:1. Admit for crisis management and stabilization. 2. Medication management to reduce current symptoms to base line and improve the     patient's overall level of functioning 3. Treat health problems as indicated. 4. Develop treatment plan to decrease risk of relapse upon discharge and the need for     readmission. 5. Psycho-social education regarding relapse prevention and self care. 6. Health care follow up as needed for medical problems. 7. Restart home medications where appropriate.   I certify that inpatient services furnished can reasonably be expected to improve the patient's condition.  Thedore MinsAkintayo, Jencarlo Bonadonna, MD 03/01/2014, 11:02 AM
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
## 2014-03-01 NOTE — Progress Notes (Signed)
Patient ID: Richard HillockChristopher L Gonzales, male   DOB: 13-Dec-1990, 23 y.o.   MRN: [id number] Psychoeducational Group Note  Date:  03/01/2014 Time:  0930  Group Topic/Focus:  healthy coping skills.   Participation Level: Did Not Attend  Participation Quality:  Not Applicable  Affect:  Not Applicable  Cognitive:  Not Applicable  Insight:  Not Applicable  Engagement in Group: Not Applicable  Additional Comments:  Did not attend.   [name], [name] 03/01/2014, 9:22 AM

## 2014-03-01 NOTE — [facility] Group Notes (Signed)
[facility] Group Notes:  (Clinical Social Work)  03/01/2014  11:00-11:45AM  Summary of Progress/Problems:   The main focus of today's process group was for the patient to identify ways in which they have in the past sabotaged their own recovery and reasons they may have done this/what they received from doing it.  We then worked to identify a specific plan to avoid doing this when discharged from the hospital for this admission.  The patient expressed full understanding of the typical self-sabotaging behaviors that were discussed among the group members, including the various costs and benefits of such.    Type of Therapy:  Group Therapy - Process  Participation Level:  Active  Participation Quality:  Appropriate, Attentive and Sharing  Affect:  Appropriate  Cognitive:  Alert and Appropriate  Insight:  Engaged  Engagement in Therapy:  Engaged  Modes of Intervention:  Clarification, Education, Exploration, Discussion  Richard MantleMareida Grossman-Orr, [name] 03/01/2014, 12:44 PM

## 2014-03-01 NOTE — Progress Notes (Signed)
Patient presents with complaints of depression, AH, and difficulty handling his schizophrenia. Denies SI, HI at present. Reports taking medications as ordered. Patient appears sad with minimal communication with Clinical research associatewriter. Patient is calm and cooperative at present.  Encouragement offered. Patient introduced to the unit.  Patient safety maintained, Q 15 checks in place.

## 2014-03-01 NOTE — Progress Notes (Signed)
Patient ID: [name], male   DOB: [birth year], 23 y.o.   MRN: [id number] Psychoeducational Group Note  Date:  03/01/2014 Time:  0910  Group Topic/Focus:  inventory group   Participation Level: Did Not Attend  Participation Quality:  Not Applicable  Affect:  Not Applicable  Cognitive:  Not Applicable  Insight:  Not Applicable  Engagement in Group: Not Applicable  Additional Comments:  Did not attend.   [name], [name] 03/01/2014, 9:21 AM

## 2014-03-01 NOTE — Progress Notes (Signed)
Patient ID: Richard HillockChristopher L Gonzales, male   DOB: 1990/12/04, 23 y.o.   MRN: [id number] 03-01-14 nursing shift note: D: pt came to the medication window for medications this am. He took his medications. He continues to have auditory hallucinations. He stated he was having some passive SI,  but is able to verbally contract not to self harm.  A: at 1428 he was shaking, nervous and stated the "voices in my head are worse". RN requested and obtained an order from MD for haldol 10 mg, ativan 2mg  and benadryl 50 mg. R: on his inventory sheet he wrote: slept well, appetite improving, energy normal, attention improving with his depression and hopelessness both at 4. He has no pain. After discharge he plans to " not to be selfish", he stated. RN will monitor and Q 15 min ck's continue.

## 2014-03-01 NOTE — Progress Notes (Signed)
D: pt c/o hearing voices and gaining increasingly agitated because the voices wont stop. Pt seen in hallway telling the voices to shut up and stop. Denies si/hi/ and pain. Pt attended wrap up group this evening. Pt is appropriate on unit. Minimal contact. Brief eye contact. Pt stated the voices are saying a lot, but they aren't telling him to hurt himself A: scheduled medications given. Support and encouragement offered. q 15 min safety checks R: pt remains safe on unit. No further signs of distress noted.

## 2014-03-02 DIAGNOSIS — F319 Bipolar disorder, unspecified: Secondary | ICD-10-CM

## 2014-03-02 MED ORDER — HALOPERIDOL LACTATE 5 MG/ML IJ SOLN: 10.0000 mg | Freq: Once | INTRAMUSCULAR | Status: DC

## 2014-03-02 MED ORDER — HALOPERIDOL LACTATE 5 MG/ML IJ SOLN
10.0000 mg | Freq: Once | INTRAMUSCULAR | Status: AC
  Administered 2014-03-02: 10 mg via INTRAMUSCULAR
  Filled 2014-03-02 (×2): qty 2

## 2014-03-02 MED ORDER — LORAZEPAM 1 MG PO TABS: 2.0000 mg | ORAL_TABLET | Freq: Once | ORAL | Status: AC

## 2014-03-02 MED ORDER — RISPERIDONE 2 MG PO TBDP
2.0000 mg | ORAL_TABLET | Freq: Three times a day (TID) | ORAL | Status: DC | PRN
  Administered 2014-03-02 – 2014-03-05 (×4): 2 mg via ORAL
  Filled 2014-03-02 (×4): qty 1

## 2014-03-02 MED ORDER — LORAZEPAM 1 MG PO TABS
1.0000 mg | ORAL_TABLET | ORAL | Status: AC | PRN
  Administered 2014-03-02: 1 mg via ORAL
  Filled 2014-03-02: qty 1

## 2014-03-02 MED ORDER — DIPHENHYDRAMINE HCL 50 MG/ML IJ SOLN
50.0000 mg | Freq: Once | INTRAMUSCULAR | Status: DC
  Filled 2014-03-02: qty 1

## 2014-03-02 MED ORDER — HALOPERIDOL 5 MG PO TABS: 10.0000 mg | ORAL_TABLET | Freq: Once | ORAL | Status: DC

## 2014-03-02 MED ORDER — LORAZEPAM 1 MG PO TABS: 2.0000 mg | ORAL_TABLET | Freq: Once | ORAL | Status: DC

## 2014-03-02 MED ORDER — LORAZEPAM 2 MG/ML IJ SOLN
2.0000 mg | Freq: Once | INTRAMUSCULAR | Status: AC
  Administered 2014-03-02: 2 mg via INTRAMUSCULAR
  Filled 2014-03-02: qty 1

## 2014-03-02 MED ORDER — ZIPRASIDONE MESYLATE 20 MG IM SOLR: 20.0000 mg | INTRAMUSCULAR | Status: DC | PRN

## 2014-03-02 MED ORDER — DIPHENHYDRAMINE HCL 50 MG PO CAPS
50.0000 mg | ORAL_CAPSULE | Freq: Once | ORAL | Status: AC
  Filled 2014-03-02: qty 1

## 2014-03-02 MED ORDER — DIPHENHYDRAMINE HCL 50 MG/ML IJ SOLN: 50.0000 mg | Freq: Once | INTRAMUSCULAR | Status: DC

## 2014-03-02 MED ORDER — DIPHENHYDRAMINE HCL 50 MG/ML IJ SOLN
50.0000 mg | Freq: Once | INTRAMUSCULAR | Status: AC
  Administered 2014-03-02: 50 mg via INTRAMUSCULAR
  Filled 2014-03-02 (×2): qty 1

## 2014-03-02 MED ORDER — ZIPRASIDONE HCL 20 MG PO CAPS
ORAL_CAPSULE | ORAL | Status: AC
  Filled 2014-03-02: qty 1

## 2014-03-02 MED ORDER — DIPHENHYDRAMINE HCL 50 MG PO CAPS: 50.0000 mg | ORAL_CAPSULE | Freq: Once | ORAL | Status: DC

## 2014-03-02 MED ORDER — HALOPERIDOL 5 MG PO TABS
10.0000 mg | ORAL_TABLET | Freq: Once | ORAL | Status: AC
  Filled 2014-03-02: qty 2

## 2014-03-02 MED ORDER — LORAZEPAM 2 MG/ML IJ SOLN: 2.0000 mg | Freq: Once | INTRAMUSCULAR | Status: DC

## 2014-03-02 NOTE — Progress Notes (Signed)
The patient walked out of group before it began.

## 2014-03-02 NOTE — Progress Notes (Signed)
[facility] MD Progress Note  03/02/2014 10:55 AM PANFILO KETCHUM  MRN:  [id number] Subjective:  Caucasian male 23 years old was reevaluated today.  He reports hearing chatter voices but could not make out what they are saying to him.  He was calm and cooperative.  He denied SI/HI and was asking for medication changes.  Patient feels his medications are no longer effective because he has been taking them for a long time.  Patient was encouraged to continue taking his medications for now and that we will reevaluate and change our plan of care as needed.   Patient was medicated once yesterday for agitation and he later calmed down and attended groups  with his peers.  Patient reported missing am groups because  His night time medications keep him sleepy.  Diagnosis:  Schizoaffective disorder, Bipolar type, Cannabis use disorder.  DSM5: Schizophrenia Disorders:   Obsessive-Compulsive Disorders:   Trauma-Stressor Disorders:   Substance/Addictive Disorders:  Cannabis Use Disorder - Moderate 9304.30) Depressive Disorders:   Total Time spent with patient: 30 minutes  Axis I: Schizoaffective disorder, Bipolar type, Cannabis use disorder Axis II: Deferred Axis III:  Past Medical History  Diagnosis Date  . Schizophrenia   . Hypothyroidism   . Hypercholesterolemia   . Hypertension   . Depression    Axis IV: other psychosocial or environmental problems and problems related to social environment Axis V: 41-50 serious symptoms  ADL's:  Intact  Sleep: Good  Appetite:  Good    Psychiatric Specialty Exam: Physical Exam  ROS  Blood pressure 134/85, pulse 94, temperature 97.5 F (36.4 C), temperature source Oral, resp. rate 18, height 5' 9"  (1.753 m), weight 123.378 kg (272 lb).Body mass index is 40.15 kg/(m^2).  General Appearance: Casual and Fairly Groomed  Engineer, water::  Fair  Speech:  Clear and Coherent and Normal Rate  Volume:  Normal  Mood:  Depressed  Affect:  Congruent, Depressed and  Flat  Thought Process:  Coherent and Goal Directed  Orientation:  Full (Time, Place, and Person)  Thought Content:  Hallucinations: heras chatter noise, unable to describe what voices are saying  Suicidal Thoughts:  No  Homicidal Thoughts:  No  Memory:  Immediate;   Good Recent;   Good Remote;   Fair  Judgement:  Fair  Insight:  Good  Psychomotor Activity:  Normal  Concentration:  Good  Recall:  NA  Fund of Knowledge:Good  Language: Good  Akathisia:  NA  Handed:  Right  AIMS (if indicated):     Assets:  Desire for Improvement  Sleep:  Number of Hours: 6.75   Musculoskeletal: Strength & Muscle Tone: within normal limits Gait & Station: normal Patient leans: N/A  Current Medications: Current Facility-Administered Medications  Medication Dose Route Frequency Provider Last Rate Last Dose  . acetaminophen (TYLENOL) tablet 650 mg  650 mg Oral Q6H PRN Lurena Nida, NP      . alum & mag hydroxide-simeth (MAALOX/MYLANTA) 200-200-20 MG/5ML suspension 30 mL  30 mL Oral Q4H PRN Lurena Nida, NP      . benztropine (COGENTIN) tablet 1 mg  1 mg Oral QHS Lurena Nida, NP   1 mg at 03/01/14 2114  . busPIRone (BUSPAR) tablet 15 mg  15 mg Oral TID Lurena Nida, NP   15 mg at 03/02/14 0737  . diphenhydrAMINE (BENADRYL) capsule 50 mg  50 mg Oral Once Delfin Gant, NP       Or  . diphenhydrAMINE (BENADRYL) injection 50  mg  50 mg Intramuscular Once Delfin Gant, NP      . diphenhydrAMINE (BENADRYL) injection 50 mg  50 mg Intramuscular Once Delfin Gant, NP      . divalproex (DEPAKOTE) DR tablet 2,000 mg  2,000 mg Oral QHS Lurena Nida, NP   2,000 mg at 03/01/14 2113  . fenofibrate tablet 160 mg  160 mg Oral q morning - 10a Lurena Nida, NP   160 mg at 03/02/14 0737  . FLUoxetine (PROZAC) capsule 60 mg  60 mg Oral q morning - 10a Lurena Nida, NP   60 mg at 03/02/14 0737  . haloperidol (HALDOL) tablet 10 mg  10 mg Oral Once Delfin Gant, NP       Or  . haloperidol  lactate (HALDOL) injection 10 mg  10 mg Intramuscular Once Delfin Gant, NP      . haloperidol (HALDOL) tablet 20 mg  20 mg Oral QHS Lurena Nida, NP   20 mg at 03/01/14 2113  . levothyroxine (SYNTHROID, LEVOTHROID) tablet 100 mcg  100 mcg Oral Daily Lurena Nida, NP   100 mcg at 03/02/14 0737  . LORazepam (ATIVAN) tablet 2 mg  2 mg Oral Once Delfin Gant, NP       Or  . LORazepam (ATIVAN) injection 2 mg  2 mg Intramuscular Once Delfin Gant, NP      . magnesium hydroxide (MILK OF MAGNESIA) suspension 30 mL  30 mL Oral Daily PRN Lurena Nida, NP      . propranolol (INDERAL) tablet 10 mg  10 mg Oral BID Lurena Nida, NP   10 mg at 03/02/14 0737  . simvastatin (ZOCOR) tablet 20 mg  20 mg Oral QHS Lurena Nida, NP   20 mg at 03/01/14 2114  . traZODone (DESYREL) tablet 150 mg  150 mg Oral QHS PRN Lurena Nida, NP   150 mg at 03/01/14 2114    Lab Results:  Results for orders placed during the hospital encounter of 02/28/14 (from the past 48 hour(s))  URINE RAPID DRUG SCREEN (HOSP PERFORMED)     Status: Abnormal   Collection Time    02/28/14  4:30 PM      Result Value Ref Range   Opiates NONE DETECTED  NONE DETECTED   Cocaine NONE DETECTED  NONE DETECTED   Benzodiazepines NONE DETECTED  NONE DETECTED   Amphetamines NONE DETECTED  NONE DETECTED   Tetrahydrocannabinol POSITIVE (*) NONE DETECTED   Barbiturates NONE DETECTED  NONE DETECTED  ETHANOL     Status: None   Collection Time    02/28/14  7:20 PM      Result Value Ref Range   Alcohol, Ethyl (B) <11  0 - 11 mg/dL   Comment:            LOWEST DETECTABLE LIMIT FOR     SERUM ALCOHOL IS 11 mg/dL     FOR MEDICAL PURPOSES ONLY  BASIC METABOLIC PANEL     Status: Abnormal   Collection Time    02/28/14  7:20 PM      Result Value Ref Range   Sodium 138  137 - 147 mEq/L   Potassium 4.1  3.7 - 5.3 mEq/L   Chloride 102  96 - 112 mEq/L   CO2 23  19 - 32 mEq/L   Glucose, Bld 149 (*) 70 - 99 mg/dL   BUN 13  6 - 23 mg/dL  Creatinine, Ser 0.90  0.50 - 1.35 mg/dL   Calcium 9.0  8.4 - 10.5 mg/dL   GFR calc non Af Amer >90  >90 mL/min   GFR calc Af Amer >90  >90 mL/min   Comment: (NOTE)     The eGFR has been calculated using the CKD EPI equation.     This calculation has not been validated in all clinical situations.     eGFR's persistently <90 mL/min signify possible Chronic Kidney     Disease.  CBC WITH DIFFERENTIAL     Status: None   Collection Time    02/28/14  7:20 PM      Result Value Ref Range   WBC 6.2  4.0 - 10.5 K/uL   RBC 4.48  4.22 - 5.81 MIL/uL   Hemoglobin 14.2  13.0 - 17.0 g/dL   HCT 41.2  39.0 - 52.0 %   MCV 92.0  78.0 - 100.0 fL   MCH 31.7  26.0 - 34.0 pg   MCHC 34.5  30.0 - 36.0 g/dL   RDW 12.5  11.5 - 15.5 %   Platelets 217  150 - 400 K/uL   Neutrophils Relative % 55  43 - 77 %   Neutro Abs 3.4  1.7 - 7.7 K/uL   Lymphocytes Relative 35  12 - 46 %   Lymphs Abs 2.2  0.7 - 4.0 K/uL   Monocytes Relative 8  3 - 12 %   Monocytes Absolute 0.5  0.1 - 1.0 K/uL   Eosinophils Relative 3  0 - 5 %   Eosinophils Absolute 0.2  0.0 - 0.7 K/uL   Basophils Relative 0  0 - 1 %   Basophils Absolute 0.0  0.0 - 0.1 K/uL    Physical Findings: AIMS: Facial and Oral Movements Muscles of Facial Expression: None, normal Lips and Perioral Area: None, normal Jaw: None, normal Tongue: None, normal,Extremity Movements Upper (arms, wrists, hands, fingers): None, normal Lower (legs, knees, ankles, toes): None, normal, Trunk Movements Neck, shoulders, hips: None, normal, Overall Severity Severity of abnormal movements (highest score from questions above): None, normal Incapacitation due to abnormal movements: None, normal Patient's awareness of abnormal movements (rate only patient's report): No Awareness, Dental Status Current problems with teeth and/or dentures?: No Does patient usually wear dentures?: No  CIWA:    COWS:     Treatment Plan Summary: Daily contact with patient to assess and  evaluate symptoms and progress in treatment Medication management  Plan:  Continue with plan of care Continue crisis management Continue medication management/ and review as needed Continue taking Buspar 15 mg po three times a day for anxiety  Trazodone 150 mg po at night time for sleep as needed  Divalproex 2000 mg at night time for mood stabilization  Prozac 60 mg po daily for depression  Cogentin 1 mg po at bed time for medication induced Extrapyramidal symptoms  Haldol 20 mg po at night time. Encourage to participate in group and individual sessions Discharge  Plan in progress Address health issues /V/S as needed    Medical Decision Making Problem Points:  Established problem, stable/improving (1) Data Points:  Review and summation of old records (2)  I certify that inpatient services furnished can reasonably be expected to improve the patient's condition.   Charmaine Downs, C  PMHNP-BC 03/02/2014, 10:55 AM    Patient seen, evaluated and I agree with notes by Nurse Practitioner. Corena Pilgrim, MD

## 2014-03-02 NOTE — [facility] Group Notes (Signed)
[facility] Group Notes:  (Clinical Social Work)  03/02/2014   11:15am-12:00pm  Summary of Progress/Problems:  The main focus of today's process group was to listen to a variety of genres of music and to identify that different types of music provoke different responses.  The patient then was able to identify personally what was soothing for them, as well as energizing.  Handouts were used to record feelings evoked, as well as how patient can personally use this knowledge in sleep habits, with depression, and with other symptoms.  The patient expressed understanding of concepts, as well as knowledge of how each type of music affected him/her and how this can be used at home as a wellness/recovery tool.  Throughout group, he rocked vigorously back and forth in the chair he was in, and he had a variety of emotions to play over his face from happiness to anger.  When asked about this, he said that the previous song was still affecting him.  The group encouraged him to stay "in the moment."  Type of Therapy:  Music Therapy   Participation Level:  Active  Participation Quality:  Attentive and Sharing  Affect:  Blunted  Cognitive:  Oriented  Insight:  Engaged  Engagement in Therapy:  Engaged  Modes of Intervention:   Activity, Exploration  Ambrose MantleMareida Grossman-Orr, [name] 03/02/2014, 12:30pm

## 2014-03-02 NOTE — Progress Notes (Signed)
Patient ID: [name], male   DOB: 1990/12/30, 23 y.o.   MRN: [id number] Psychoeducational Group Note  Date:  03/02/2014 Time:  0911  Group Topic/Focus:  healthy support systems.   Participation Level: Did Not Attend  Participation Quality:  Not Applicable  Affect:  Not Applicable  Cognitive:  Not Applicable  Insight:  Not Applicable  Engagement in Group: Not Applicable  Additional Comments:  Did not attend.   [name], [name] 03/02/2014, 9:11 AM

## 2014-03-02 NOTE — Progress Notes (Signed)
Patient ID: Richard HillockChristopher L Gonzales, male   DOB: May 24, 1991, 23 y.o.   MRN: [id number] D: pt. In bed, eyes closed, respirations even. A: Writer observed for s/s of distress. Staff will monitor for q7415min for safety. R: pt. Is safe on the unit, no distress noted, respirations unlabored.

## 2014-03-02 NOTE — Progress Notes (Signed)
Patient ID: Richard HillockChristopher L Gonzales, male   DOB: 10/07/1990, 23 y.o.   MRN: [id number] 03-02-14 nursing shift note: d: pt has been visible in the milieu. He took his morning medications, but was unable to go to am groups. He stated his pm medications make him sleepy. He endorses auditory hallucinations intermittently, but denies si/hi.  A: at 1215 the pt began to rock back and forth and was becoming agitated, stating he was actively hallucinating. RN obtained orders for haldol, benadryl and ativan x1 prn in the event that the pt becomes aggressive/agitated earlier. They medications were administered to calm the patient.  R: the medications decreased his agitation. on his inventory sheet: slept well, appetite good, energy normal with his depression and hopelessness both at 6. After discharge he plans to "participate in the ACT team groups. RN will monitor and Q 15 min ck's continue.

## 2014-03-02 NOTE — Progress Notes (Signed)
Patient ID: Richard HillockChristopher L Gonzales, male   DOB: Jul 23, 1991, 23 y.o.   MRN: [id number] Psychoeducational Group Note  Date:  03/02/2014 Time:  0905  Group Topic/Focus:  inventory group   Participation Level: Did Not Attend  Participation Quality:  Not Applicable  Affect:  Not Applicable  Cognitive:  Not Applicable  Insight:  Not Applicable  Engagement in Group: Not Applicable  Additional Comments:  Did not attend   Richard DavidWeaver, [name] 03/02/2014, 9:11 AM

## 2014-03-02 NOTE — Progress Notes (Signed)
D: pt became agitated and anxious this evening. Pt was yelling and laughing loudly in his room. Writer went into room, pt stated, " the voices! They won't stop!" Clinical research associatewriter notified MD. Clinical research associateWriter informed pt that MD was notified and to wait for orders to be placed. Shortly after pt seen banging head against cabinet in room. Writer asked pt to stop. Pt stated, " why, i dont want to." writer informed pt that banging head wouldn't make the voices stop. Pt stated that the medications aren't making them stop either. Pt stated that he wanted ECT. Writer got pt to stop banging head and sat with pt for 20min and talked about what the pt liked to do. Pt initially refused to take evening meds, but towards the end of the conversation pt agreed to take all medications. Pt is having passive si, but contracts for safety. Denies pain even after hitting head.  A: Risperdal, and scheduled medications given. Support and encouragement offered. 1:1 time given.  R: pt remains safe on unit. No further signs of distress noted.

## 2014-03-03 DIAGNOSIS — R45851 Suicidal ideations: Secondary | ICD-10-CM

## 2014-03-03 MED ORDER — HALOPERIDOL 5 MG PO TABS
10.0000 mg | ORAL_TABLET | Freq: Two times a day (BID) | ORAL | Status: DC
  Administered 2014-03-03 – 2014-03-07 (×8): 10 mg via ORAL
  Filled 2014-03-03 (×6): qty 2
  Filled 2014-03-03: qty 8
  Filled 2014-03-03: qty 2
  Filled 2014-03-03: qty 8
  Filled 2014-03-03: qty 2
  Filled 2014-03-03 (×2): qty 8
  Filled 2014-03-03 (×2): qty 2

## 2014-03-03 MED ORDER — BENZTROPINE MESYLATE 1 MG PO TABS
1.0000 mg | ORAL_TABLET | Freq: Two times a day (BID) | ORAL | Status: DC
  Administered 2014-03-03 – 2014-03-07 (×8): 1 mg via ORAL
  Filled 2014-03-03: qty 4
  Filled 2014-03-03 (×8): qty 1
  Filled 2014-03-03 (×3): qty 4
  Filled 2014-03-03 (×2): qty 1

## 2014-03-03 MED ORDER — LORAZEPAM 1 MG PO TABS
ORAL_TABLET | ORAL | Status: AC
  Filled 2014-03-03: qty 1

## 2014-03-03 MED ORDER — LORAZEPAM 1 MG PO TABS
1.0000 mg | ORAL_TABLET | Freq: Once | ORAL | Status: AC
  Administered 2014-03-03: 1 mg via ORAL

## 2014-03-03 NOTE — [facility] Group Notes (Signed)
[name]   03/03/2014 10:18 AM  Participation Quality:  Did not attend    [place], [place] Ferch B

## 2014-03-03 NOTE — Progress Notes (Signed)
Pt refused medication, pt refused vitals. Pt was informed he needed to take medication.

## 2014-03-03 NOTE — Progress Notes (Signed)
Pt got vital signs done, pt took medications

## 2014-03-03 NOTE — Progress Notes (Signed)
D:  Pt +ve AH- not commandPt denies SI/HI/VH. Pt stated his medications don't work, pt appears to have a lot of behavior issues. Pt was confrontational, argumentative and oppositional most of the day.    A: Pt was offered support and encouragement. Pt was given scheduled medications. Pt was encourage to attend groups. Q 15 minute checks were done for safety.   R: safety maintained on unit.

## 2014-03-03 NOTE — Progress Notes (Signed)
Did not participate; was in his room

## 2014-03-03 NOTE — Progress Notes (Signed)
Patient ID: [name], male   DOB: 1991/03/07, 23 y.o.   MRN: [id number] [facility]  03/03/2014 10:50 AM [name]  MRN:  [id number] Subjective: " I am still hearing voices, they have refused to away.'' Objective:  Patient is seen and his chart reviewed with the members of the treatment team. He continues to report command auditory hallucinations and agitated mood. He endorses hearing voices telling him bad stuffs like "you are no good, why don't you hurt yourself?'' He has been observed by the staffs talking to himself loud in his room as if responding to an internal stimuli. Patient has not been attending the unit milieu and spends most of his time sleeping in his room. However, he has been compliant with his medications and has not verbalized any adverse reactions.   Diagnosis:  Schizoaffective disorder-Bipolar type                     Cannabis use disorder.  DSM5: Schizophrenia Disorders:  psychosis Obsessive-Compulsive Disorders:   Trauma-Stressor Disorders:   Substance/Addictive Disorders:  Cannabis Use Disorder - Moderate 9304.30) Depressive Disorders:  Mood swings Total Time spent with patient: 30 minutes  Axis I: Schizoaffective disorder, Bipolar type, Cannabis use disorder Axis II: Deferred Axis III:  Past Medical History  Diagnosis Date  . Hypothyroidism   . Hypercholesterolemia   . Hypertension    Axis IV: other psychosocial or environmental problems and problems related to social environment Axis V: 41-50 serious symptoms  ADL's:  Intact  Sleep: Good  Appetite:  Good    Psychiatric Specialty Exam: Physical Exam  Psychiatric: His mood appears anxious. His affect is labile. His speech is rapid and/or pressured. He is agitated and actively hallucinating. Thought content is paranoid. Cognition and memory are normal. He expresses impulsivity.    Review of Systems  Constitutional: Negative.   HENT: Negative.   Eyes: Negative.    Respiratory: Negative.   Cardiovascular: Negative.   Gastrointestinal: Negative.   Genitourinary: Negative.   Musculoskeletal: Negative.   Skin: Negative.   Neurological: Negative.   Endo/Heme/Allergies: Negative.   Psychiatric/Behavioral: Positive for hallucinations. The patient is nervous/anxious.     Blood pressure 111/63, pulse 101, temperature 97.4 F (36.3 C), temperature source Oral, resp. rate 20, height 5\' 9"  (1.753 m), weight 123.378 kg (272 lb).Body mass index is 40.15 kg/(m^2).  General Appearance: Casual and Fairly Groomed  Patent attorneyye Contact::  Fair  Speech:  Clear and Coherent and Normal Rate  Volume:  Normal  Mood:  Depressed  Affect:  Congruent, Depressed and Flat  Thought Process:  Coherent and Goal Directed  Orientation:  Full (Time, Place, and Person)  Thought Content:  Hallucinations: heras chatter noise, unable to describe what voices are saying  Suicidal Thoughts:  yes  Homicidal Thoughts:  No  Memory:  Immediate;   Good Recent;   Good Remote;   Fair  Judgement:  Fair  Insight:  Good  Psychomotor Activity:  Normal  Concentration:  Good  Recall:  NA  Fund of Knowledge:Good  Language: Good  Akathisia:  NA  Handed:  Right  AIMS (if indicated):     Assets:  Desire for Improvement  Sleep:  Number of Hours: 6.75   Musculoskeletal: Strength & Muscle Tone: within normal limits Gait & Station: normal Patient leans: N/A  Current Medications: Current Facility-Administered Medications  Medication Dose Route Frequency Provider Last Rate Last Dose  . acetaminophen (TYLENOL) tablet 650 mg  650 mg Oral  Q6H PRN Kristeen MansFran E Hobson, NP      . alum & mag hydroxide-simeth (MAALOX/MYLANTA) 200-200-20 MG/5ML suspension 30 mL  30 mL Oral Q4H PRN Kristeen MansFran E Hobson, NP      . benztropine (COGENTIN) tablet 1 mg  1 mg Oral QHS Kristeen MansFran E Hobson, NP   1 mg at 03/02/14 2117  . busPIRone (BUSPAR) tablet 15 mg  15 mg Oral TID Kristeen MansFran E Hobson, NP   15 mg at 03/03/14 1029  . divalproex (DEPAKOTE)  DR tablet 2,000 mg  2,000 mg Oral QHS Kristeen MansFran E Hobson, NP   2,000 mg at 03/02/14 2117  . fenofibrate tablet 160 mg  160 mg Oral q morning - 10a Kristeen MansFran E Hobson, NP   160 mg at 03/03/14 1028  . FLUoxetine (PROZAC) capsule 60 mg  60 mg Oral q morning - 10a Kristeen MansFran E Hobson, NP   60 mg at 03/03/14 1028  . haloperidol (HALDOL) tablet 10 mg  10 mg Oral BID Mysha Peeler      . levothyroxine (SYNTHROID, LEVOTHROID) tablet 100 mcg  100 mcg Oral Daily Kristeen MansFran E Hobson, NP   100 mcg at 03/03/14 1029  . magnesium hydroxide (MILK OF MAGNESIA) suspension 30 mL  30 mL Oral Daily PRN Kristeen MansFran E Hobson, NP   30 mL at 03/02/14 1417  . propranolol (INDERAL) tablet 10 mg  10 mg Oral BID Kristeen MansFran E Hobson, NP   10 mg at 03/03/14 1000  . risperiDONE (RISPERDAL M-TABS) disintegrating tablet 2 mg  2 mg Oral Q8H PRN Kristeen MansFran E Hobson, NP   2 mg at 03/02/14 2117   And  . ziprasidone (GEODON) injection 20 mg  20 mg Intramuscular PRN Kristeen MansFran E Hobson, NP      . simvastatin (ZOCOR) tablet 20 mg  20 mg Oral QHS Kristeen MansFran E Hobson, NP   20 mg at 03/02/14 2120  . traZODone (DESYREL) tablet 150 mg  150 mg Oral QHS PRN Kristeen MansFran E Hobson, NP   150 mg at 03/02/14 2226    Lab Results:  No results found for this or any previous visit (from the past 48 hour(s)).  Physical Findings: AIMS: Facial and Oral Movements Muscles of Facial Expression: None, normal Lips and Perioral Area: None, normal Jaw: None, normal Tongue: None, normal,Extremity Movements Upper (arms, wrists, hands, fingers): None, normal Lower (legs, knees, ankles, toes): None, normal, Trunk Movements Neck, shoulders, hips: None, normal, Overall Severity Severity of abnormal movements (highest score from questions above): None, normal Incapacitation due to abnormal movements: None, normal Patient's awareness of abnormal movements (rate only patient's report): No Awareness, Dental Status Current problems with teeth and/or dentures?: No Does patient usually wear dentures?: No  CIWA:    COWS:      Treatment Plan Summary: Daily contact with patient to assess and evaluate symptoms and progress in treatment Medication management  Plan:  Continue with plan of care Continue crisis management Continue medication management/ and review as needed Continue taking Buspar 15 mg po three times a day for anxiety  Continue Trazodone 150 mg po at night time for sleep as needed  Divalproex 2000 mg at night time for mood stabilization  Prozac 60 mg po daily for depression  Cogentin 1 mg po at bed time for medication induced Extrapyramidal symptoms  Change Haldol to 10mg  po BID for psychosis and delusions. Encourage to participate in group and individual sessions Discharge  Plan in progress Address health issues /V/S as needed    Medical Decision Making Problem  Points:  Established problem, stable/improving (1) Data Points:  Review and summation of old records (2)  I certify that inpatient services furnished can reasonably be expected to improve the patient's condition.   Thedore Mins, MD 03/03/2014, 10:50 AM

## 2014-03-03 NOTE — Progress Notes (Signed)
Pt in dayroom interacting appropriately.

## 2014-03-03 NOTE — Tx Team (Signed)
  Interdisciplinary Treatment Plan Update   Date Reviewed:  03/03/2014  Time Reviewed:  8:16 AM  Progress in Treatment:   Attending groups: No Participating in groups: No Taking medication as prescribed: Yes  Tolerating medication: Yes Family/Significant other contact made: No  Patient understands diagnosis: Yes AEB asking for help with voices, SI Discussing patient identified problems/goals with staff: Yes  See initial care plan Medical problems stabilized or resolved: Yes Denies suicidal/homicidal ideation: Yes In tx team  Patient has not harmed self or others: Yes  For review of initial/current patient goals, please see plan of care.  Estimated Length of Stay:  4-5 days  Reason for Continuation of Hospitalization: Depression Hallucinations Medication stabilization  New Problems/Goals identified:  N/A  Discharge Plan or Barriers:   [facility] follow up with ACT team  Additional Comments:  [name] is an 23 y.o. single white male with a history of schizophrenia, and a family history of mental health problems. Pt presents voluntarily to the ED due to not feeling like he could trust himself. He reports that he has been having suicidal ideation, and did not feel he could keep himself safe. [facility], and was brought to the ED. Pt reports increased anxious and depressed feelings the past 3 days accompanied by auditory and visual hallucinations.    Attendees:  Signature: Thedore MinsMojeed Akintayo, MD 03/03/2014 8:16 AM   Signature: Richelle Itood Thirza Pellicano, [name] 03/03/2014 8:16 AM  Signature: Fransisca KaufmannLaura Davis, NP 03/03/2014 8:16 AM  Signature: Joslyn Devonaroline Beaudry, RN 03/03/2014 8:16 AM  Signature: Liborio NixonPatrice White, RN 03/03/2014 8:16 AM  Signature:  03/03/2014 8:16 AM  Signature:   03/03/2014 8:16 AM  Signature:    Signature:    Signature:    Signature:    Signature:    Signature:      Scribe for Treatment Team:   [employer] Brevan Luberto, [name]   03/03/2014 8:16 AM

## 2014-03-03 NOTE — Progress Notes (Signed)
  D: Pt observed sleeping in bed with eyes closed. RR even and unlabored. No distress noted  .  A: Q 15 minute checks were done for safety.  R: safety maintained on unit.  

## 2014-03-03 NOTE — Progress Notes (Signed)
D: pt stated he slept better last night than he did the night before. Denies si/hi/ pt stated he is still hearing the voices but they aren't bothering him as bad as they were before. Pt did not attend morning group, instead stayed in bed. Pt became a little agitated, seen rocking back and forth in dayroom and responding outwardly to voices.  A: Risperdal 27m given. Writer told pt if still feeling anxious 349m after taking medications Ativan 65m40ms available. scheduled medications given. q 15 min safety checks. Support and encouragement offered R: pt remains safe on unit. No further signs of distress atm.

## 2014-03-03 NOTE — Progress Notes (Signed)
Patient ID: Richard HillockChristopher L Hafen, male   DOB: [birth year], 23 y.o.   MRN: [id number] Pt did not attend group Activity Group.

## 2014-03-03 NOTE — BH Assessment (Signed)
Pt was anxious, sitting in chair stating that his medications weren't working. Pt was given 1x dose ativan . Previous reports stated he was punching walls earlier.

## 2014-03-03 NOTE — [facility] Group Notes (Signed)
[facility] [name] Group Therapy  03/03/2014 1:15 pm  Type of Therapy: Process Group Therapy  Participation Level:  Active  Participation Quality:  Appropriate  Affect:  Flat  Cognitive:  Oriented  Insight:  Improving  Engagement in Group:  Limited  Engagement in Therapy:  Limited  Modes of Intervention:  Activity, Clarification, Education, Problem-solving and Support  Summary of Progress/Problems: Today's group addressed the issue of overcoming obstacles.  Patients were asked to identify their biggest obstacle post d/c that stands in the way of their on-going success, and then problem solve as to how to manage this. Thayer OhmChris states he wants to get his own place "because [facility] and [facility] only leave me with $66.00 per month."  After validating his feelings about this, asked him if he knows how to go about getting his own place.  He admitted he did not, so other group members gave him feedback about budgeting, applications, background checks, and how to find a decent place.  He rejected all this by stating "The ACT team does everything for me."  Did not change his mind when another pt pointed out that if he needs the ACT team to do this, he probably isn't ready to live on his own.  Limited insight.  Daryel Geraldorth, Chasta Deshpande B 03/03/2014   4:07 PM

## 2014-03-03 NOTE — Progress Notes (Signed)
The focus of this group is to help patients review their daily goal of treatment and discuss progress on daily workbooks. Pt attended the evening group session and responded to all discussion prompts from the Writer. Pt shared that today was a bad day on the unit due to the intensity of the voices in his head. Pt did say that a recent medication had started to make him feel better, but that it was still overall a bad day. Pt's only additional request this evening was for towels, which were given to him following group. Pt said that upon discharge he planned on more actively participating with his ACT Team as a way to stay well. Pt's affect was flat.

## 2014-03-03 NOTE — Progress Notes (Signed)
Pt came to med window "I'm stop being an ASS now, I will take my medications and get my vital signs done"

## 2014-03-04 DIAGNOSIS — F1210 Cannabis abuse, uncomplicated: Secondary | ICD-10-CM

## 2014-03-04 DIAGNOSIS — F259 Schizoaffective disorder, unspecified: Principal | ICD-10-CM

## 2014-03-04 MED ORDER — HYDROXYZINE HCL 50 MG PO TABS
50.0000 mg | ORAL_TABLET | Freq: Four times a day (QID) | ORAL | Status: DC | PRN
  Administered 2014-03-04 (×2): 50 mg via ORAL
  Filled 2014-03-04 (×2): qty 1

## 2014-03-04 NOTE — Progress Notes (Signed)
Adult Art Group  Date:  03/04/2014 Time:  2:49 PM  Group Topic/Focus: Art  Participation Level: Did not attend  Activity: Patients asked to create art to coincide with daily unit theme using any combination of markers, crayons, color pencils, construction paper, magazine clippings, glue, and scissors.   Additional Comments:  Pts talked about different aspects of hygiene and how it relates to recovery. Pts used pictures from magazines to identify different parts of hygiene and used them to make a collage.  Owen, Dana C 03/04/2014, 2:49 PM 

## 2014-03-04 NOTE — [facility] Group Notes (Signed)
[facility] [name] Group Therapy  03/04/2014 , 12:57 PM   Type of Therapy:  Group Therapy  Participation Level:  Active  Participation Quality:  Attentive  Affect:  Appropriate  Cognitive:  Alert  Insight:  Improving  Engagement in Therapy:  Engaged  Modes of Intervention:  Discussion, Exploration and Socialization  Summary of Progress/Problems: Today's group focused on the term Diagnosis.  Participants were asked to define the term, and then pronounce whether it is a negative, positive or neutral term.  Thayer OhmChris was engaged throughout, until he left abruptly about 2/3 of the way through, and did not return.  His contribution was to talk about relapse for him as stopping his meds "because somebody tricks me into taking them, and then I don't trust them anymore."  Daryel Geraldorth, Rodney B 03/04/2014 , 12:57 PM

## 2014-03-04 NOTE — Progress Notes (Signed)
Patient ID: [name] Gosch, male   DOB: 09-04-1991, 23 y.o.   MRN: [id number] [facility]  03/04/2014 11:46 AM [name] Dragon  MRN:  [id number] Subjective:  Patient states "I am still hearing a bunch of chatter. They are getting more quit. Sometimes they try to start a fight with me."   Objective:  Patient was assessed in his room today. He is reporting ongoing symptoms of depression but reports voices have started to decrease. Notes indicate that yesterday patient reported to staff that the voices were very bad. Nursing staff report that he has been argumentative and confrontational on the unit. The patient is attending some groups but spends most of his time sleeping. Patient inquired about leaving today and the steps necessary to achieve discharge were reviewed. Due to the detection of body odor it appeared that the patient had not showered today. He talked in group yesterday about wanting to live independently but had no concept of how to achieve this. Patient informed staff that he relies on the ACT team to meet his needs. The patient remains compliant with his medications and denies any adverse effects. Rates his depression today at five.   Diagnosis:  Schizoaffective disorder-Bipolar type                     Cannabis use disorder.  DSM5: Schizophrenia Disorders:  psychosis Obsessive-Compulsive Disorders:   Trauma-Stressor Disorders:   Substance/Addictive Disorders:  Cannabis Use Disorder - Moderate 9304.30) Depressive Disorders:  Mood swings Total Time spent with patient: 20 minutes  Axis I: Schizoaffective disorder, Bipolar type, Cannabis use disorder Axis II: Deferred Axis III:  Past Medical History  Diagnosis Date  . Hypothyroidism   . Hypercholesterolemia   . Hypertension    Axis IV: other psychosocial or environmental problems and problems related to social environment Axis V: 41-50 serious symptoms  ADL's:  Intact  Sleep: Good  Appetite:  Good     Psychiatric Specialty Exam: Physical Exam  Psychiatric: His mood appears anxious. His affect is labile. His speech is rapid and/or pressured. He is agitated and actively hallucinating. Thought content is paranoid. Cognition and memory are normal. He expresses impulsivity.    Review of Systems  Constitutional: Negative.   HENT: Negative.   Eyes: Negative.   Respiratory: Negative.   Cardiovascular: Negative.   Gastrointestinal: Negative.   Genitourinary: Negative.   Musculoskeletal: Negative.   Skin: Negative.   Neurological: Negative.   Endo/Heme/Allergies: Negative.   Psychiatric/Behavioral: Positive for depression and hallucinations. The patient is nervous/anxious.     Blood pressure 113/83, pulse 92, temperature 98.2 F (36.8 C), temperature source Oral, resp. rate 20, height 5\' 9"  (1.753 m), weight 123.378 kg (272 lb).Body mass index is 40.15 kg/(m^2).  General Appearance: Casual and Fairly Groomed  Patent attorneyye Contact::  Fair  Speech:  Clear and Coherent and Normal Rate  Volume:  Normal  Mood:  Depressed  Affect:  Congruent, Depressed and Flat  Thought Process:  Coherent and Goal Directed  Orientation:  Full (Time, Place, and Person)  Thought Content:  Hallucinations: hears chatter noise, unable to describe what voices are saying  Suicidal Thoughts:  Denies today   Homicidal Thoughts:  No  Memory:  Immediate;   Good Recent;   Good Remote;   Fair  Judgement:  Fair  Insight:  Good  Psychomotor Activity:  Normal  Concentration:  Good  Recall:  NA  Fund of Knowledge:Good  Language: Good  Akathisia:  NA  Handed:  Right  AIMS (if indicated):     Assets:  Communication Skills Desire for Improvement Resilience  Sleep:  Number of Hours: 6.75   Musculoskeletal: Strength & Muscle Tone: within normal limits Gait & Station: normal Patient leans: N/A  Current Medications: Current Facility-Administered Medications  Medication Dose Route Frequency Provider Last Rate Last Dose   . acetaminophen (TYLENOL) tablet 650 mg  650 mg Oral Q6H PRN Kristeen MansFran E Hobson, NP      . alum & mag hydroxide-simeth (MAALOX/MYLANTA) 200-200-20 MG/5ML suspension 30 mL  30 mL Oral Q4H PRN Kristeen MansFran E Hobson, NP      . benztropine (COGENTIN) tablet 1 mg  1 mg Oral BID Kincaid Tiger   1 mg at 03/04/14 0751  . busPIRone (BUSPAR) tablet 15 mg  15 mg Oral TID Kristeen MansFran E Hobson, NP   15 mg at 03/04/14 0751  . divalproex (DEPAKOTE) DR tablet 2,000 mg  2,000 mg Oral QHS Kristeen MansFran E Hobson, NP   2,000 mg at 03/03/14 2105  . fenofibrate tablet 160 mg  160 mg Oral q morning - 10a Kristeen MansFran E Hobson, NP   160 mg at 03/04/14 0751  . FLUoxetine (PROZAC) capsule 60 mg  60 mg Oral q morning - 10a Kristeen MansFran E Hobson, NP   60 mg at 03/04/14 0751  . haloperidol (HALDOL) tablet 10 mg  10 mg Oral BID Nychelle Cassata   10 mg at 03/04/14 0751  . levothyroxine (SYNTHROID, LEVOTHROID) tablet 100 mcg  100 mcg Oral Daily Kristeen MansFran E Hobson, NP   100 mcg at 03/04/14 0751  . magnesium hydroxide (MILK OF MAGNESIA) suspension 30 mL  30 mL Oral Daily PRN Kristeen MansFran E Hobson, NP   30 mL at 03/02/14 1417  . propranolol (INDERAL) tablet 10 mg  10 mg Oral BID Kristeen MansFran E Hobson, NP   10 mg at 03/04/14 0751  . risperiDONE (RISPERDAL M-TABS) disintegrating tablet 2 mg  2 mg Oral Q8H PRN Kristeen MansFran E Hobson, NP   2 mg at 03/03/14 1144   And  . ziprasidone (GEODON) injection 20 mg  20 mg Intramuscular PRN Kristeen MansFran E Hobson, NP      . simvastatin (ZOCOR) tablet 20 mg  20 mg Oral QHS Kristeen MansFran E Hobson, NP   20 mg at 03/03/14 2105  . traZODone (DESYREL) tablet 150 mg  150 mg Oral QHS PRN Kristeen MansFran E Hobson, NP   150 mg at 03/03/14 2106    Lab Results:  No results found for this or any previous visit (from the past 48 hour(s)).  Physical Findings: AIMS: Facial and Oral Movements Muscles of Facial Expression: None, normal Lips and Perioral Area: None, normal Jaw: None, normal Tongue: None, normal,Extremity Movements Upper (arms, wrists, hands, fingers): None, normal Lower (legs, knees,  ankles, toes): None, normal, Trunk Movements Neck, shoulders, hips: None, normal, Overall Severity Severity of abnormal movements (highest score from questions above): None, normal Incapacitation due to abnormal movements: None, normal Patient's awareness of abnormal movements (rate only patient's report): No Awareness, Dental Status Current problems with teeth and/or dentures?: No Does patient usually wear dentures?: No  CIWA:    COWS:     Treatment Plan Summary: Daily contact with patient to assess and evaluate symptoms and progress in treatment Medication management  Plan:  1.  Continue with plan of care and crisis management 2. Continue medication management/ and review as needed 3. Continue taking Buspar 15 mg po three times a day for anxiety  4. Continue Trazodone 150 mg po at  night time for sleep as needed  5. Continue Divalproex 2000 mg at night time for mood stabilization  6. Continue Prozac 60 mg po daily for depression  7. Cogentin 1 mg po at bed time for medication induced Extrapyramidal symptoms  8. Continue Haldol to 10mg  po BID for psychosis and delusions. 9. Encourage to participate in group and individual sessions 10. Discharge plan in progress 11. Vitals reviewed and stable.   Medical Decision Making Problem Points:  Established problem, stable/improving (1), Review of last therapy session (1) and Review of psycho-social stressors (1) Data Points:  Review of medication regiment & side effects (2) Review of new medications or change in dosage (2)  I certify that inpatient services furnished can reasonably be expected to improve the patient's condition.   Fransisca Kaufmann, NP-C 03/04/2014, 11:46 AM    Patient seen, evaluated and I agree with notes by Nurse Practitioner. Thedore Mins, MD

## 2014-03-04 NOTE — Progress Notes (Signed)
Patient ID: Richard HillockChristopher L Romain, male   DOB: 1991/03/07, 23 y.o.   MRN: [id number]  D: Patient reports that he still hasn't been feeling any better than when come in on admission. Not sure about med changes since he has been here. Still reports depressed mood with hopelessness. Some passive SI on and off. Reports his major problem is that he is hearing a lot of voices. Reporting to social worker that he would like to live by himself but presently in assisted living facility.  A: Staff will continue to monitor on q 15 minute checks, follow treatment plan, and give meds as ordered. R: Cooperative thus far on unit. No banging or hitting noted today.Taking meds without issue.

## 2014-03-04 NOTE — [facility] Group Notes (Signed)
Adult Psychoeducational Group Note  Date:  03/04/2014 Time:  9:18 PM  Group Topic/Focus:  Wrap-Up Group:   The focus of this group is to help patients review their daily goal of treatment and discuss progress on daily workbooks.  Participation Level:  Did Not Attend  Participation Quality:  None  Affect:  None  Cognitive:  None  Insight: None  Engagement in Group:  None  Modes of Intervention:  Discussion  Additional Comments:  Cristal DeerChristopher did not attend group.  Caroll RancherLindsay, Marjette A 03/04/2014, 9:18 PM

## 2014-03-04 NOTE — Progress Notes (Signed)
Patient ID: [name], male   DOB: May 11, 1991, 23 y.o.   MRN: [id number]  D: After the introduction writer asked the pt if he any questions or concerns. Pt stated that he was going [place] speak with the [place]. Stated he would rather take seroquel rather than haldol because "seroquel works better". Pt stated he was "out of it" this morning when he spoke to the NP. Stated, "I don't remember what she said".  A:  Support and encouragement was offered. 15 min checks continued for safety.  R: Pt remains safe.

## 2014-03-05 MED ORDER — DOCUSATE SODIUM 100 MG PO CAPS
100.0000 mg | ORAL_CAPSULE | Freq: Two times a day (BID) | ORAL | Status: DC | PRN
  Administered 2014-03-05: 100 mg via ORAL
  Filled 2014-03-05: qty 1

## 2014-03-05 MED ORDER — MAGNESIUM CITRATE PO SOLN
1.0000 | Freq: Once | ORAL | Status: AC
  Administered 2014-03-05: 1 via ORAL

## 2014-03-05 NOTE — Progress Notes (Signed)
Patient ID: [name], male   DOB: 06/24/1991, 23 y.o.   MRN: [id number] Pt did not attend Art Activity group at 9:30a.

## 2014-03-05 NOTE — [facility] Group Notes (Signed)
[name]   03/05/2014 10:17 AM  Participation Quality:  Did not attend    Cook Gonzales, Richard B

## 2014-03-05 NOTE — Progress Notes (Signed)
Patient ID: [name], male   DOB: Apr 09, 1991, 23 y.o.   MRN: [id number] [facility]  03/05/2014 9:58 AM [name]  MRN:  [id number] Subjective:  Patient states "the voices are less, my medication has started to kick in. By the way, I signed 72 hours notice yesterday which means I will be discharged on Friday.''   Objective:  Patient seen this morning and his chart was reviewed with the treatment team members. He is endorsing decreased auditory/visual hallucinations, delusional thinking and depressive symptoms. Patient seems to be minimizing his symptoms today. Yesterday, he claimed the voices are so bad telling to hurt himself but now says he is just hearing muffling sounds. Patient thought process and behavior remains bizarre and disorganized. He has poor personal hygiene and grooming and attempts made to encourage him to take regular showers have been unsuccessful. Also, he has not been attending the unit milieu, he has been spending most of his time sleeping his room.  However, he has been compliant with his medications and denies any adverse reactions.   Diagnosis:  Schizoaffective disorder-Bipolar type                     Cannabis use disorder.  DSM5: Schizophrenia Disorders:  psychosis Obsessive-Compulsive Disorders:   Trauma-Stressor Disorders:   Substance/Addictive Disorders:  Cannabis Use Disorder - Moderate 9304.30) Depressive Disorders:  Mood swings Total Time spent with patient: 20 minutes  Axis I: Schizoaffective disorder, Bipolar type, Cannabis use disorder Axis II: Deferred Axis III:  Past Medical History  Diagnosis Date  . Hypothyroidism   . Hypercholesterolemia   . Hypertension    Axis IV: other psychosocial or environmental problems and problems related to social environment Axis V: 41-50 serious symptoms  ADL's:  Intact  Sleep: Good  Appetite:  Good    Psychiatric Specialty Exam: Physical Exam  Psychiatric: His mood appears  anxious. His affect is labile. His speech is rapid and/or pressured. He is agitated and actively hallucinating. Thought content is paranoid. Cognition and memory are normal. He expresses impulsivity.    Review of Systems  Constitutional: Negative.   HENT: Negative.   Eyes: Negative.   Respiratory: Negative.   Cardiovascular: Negative.   Gastrointestinal: Negative.   Genitourinary: Negative.   Musculoskeletal: Negative.   Skin: Negative.   Neurological: Negative.   Endo/Heme/Allergies: Negative.   Psychiatric/Behavioral: Positive for depression and hallucinations. The patient is nervous/anxious.     Blood pressure 124/82, pulse 73, temperature 96.8 F (36 C), temperature source Oral, resp. rate 20, height 5\' 9"  (1.753 m), weight 123.378 kg (272 lb).Body mass index is 40.15 kg/(m^2).  General Appearance: Casual and Fairly Groomed  Patent attorneyye Contact::  Fair  Speech:  Clear and Coherent and Normal Rate  Volume:  Normal  Mood:  Depressed  Affect:  Congruent, Depressed and Flat  Thought Process:  Coherent and Goal Directed  Orientation:  Full (Time, Place, and Person)  Thought Content:  Hallucinations: hears chatter noise, unable to describe what voices are saying  Suicidal Thoughts:  Denies today   Homicidal Thoughts:  No  Memory:  Immediate;   Good Recent;   Good Remote;   Fair  Judgement:  Fair  Insight:  Good  Psychomotor Activity:  Normal  Concentration:  Good  Recall:  NA  Fund of Knowledge:Good  Language: Good  Akathisia:  NA  Handed:  Right  AIMS (if indicated):     Assets:  Communication Skills Desire for Improvement Resilience  Sleep:  Number of Hours: 6.75   Musculoskeletal: Strength & Muscle Tone: within normal limits Gait & Station: normal Patient leans: N/A  Current Medications: Current Facility-Administered Medications  Medication Dose Route Frequency Provider Last Rate Last Dose  . acetaminophen (TYLENOL) tablet 650 mg  650 mg Oral Q6H PRN Kristeen Mans, NP       . alum & mag hydroxide-simeth (MAALOX/MYLANTA) 200-200-20 MG/5ML suspension 30 mL  30 mL Oral Q4H PRN Kristeen Mans, NP      . benztropine (COGENTIN) tablet 1 mg  1 mg Oral BID Rayshard Schirtzinger   1 mg at 03/05/14 0808  . busPIRone (BUSPAR) tablet 15 mg  15 mg Oral TID Kristeen Mans, NP   15 mg at 03/05/14 2130  . divalproex (DEPAKOTE) DR tablet 2,000 mg  2,000 mg Oral QHS Kristeen Mans, NP   2,000 mg at 03/04/14 2101  . fenofibrate tablet 160 mg  160 mg Oral q morning - 10a Kristeen Mans, NP   160 mg at 03/05/14 8657  . FLUoxetine (PROZAC) capsule 60 mg  60 mg Oral q morning - 10a Kristeen Mans, NP   60 mg at 03/05/14 0952  . haloperidol (HALDOL) tablet 10 mg  10 mg Oral BID Tacuma Graffam   10 mg at 03/05/14 0808  . hydrOXYzine (ATARAX/VISTARIL) tablet 50 mg  50 mg Oral Q6H PRN Fransisca Kaufmann, NP   50 mg at 03/04/14 2101  . levothyroxine (SYNTHROID, LEVOTHROID) tablet 100 mcg  100 mcg Oral Daily Kristeen Mans, NP   100 mcg at 03/05/14 8469  . magnesium hydroxide (MILK OF MAGNESIA) suspension 30 mL  30 mL Oral Daily PRN Kristeen Mans, NP   30 mL at 03/04/14 2120  . propranolol (INDERAL) tablet 10 mg  10 mg Oral BID Kristeen Mans, NP   10 mg at 03/05/14 6295  . risperiDONE (RISPERDAL M-TABS) disintegrating tablet 2 mg  2 mg Oral Q8H PRN Kristeen Mans, NP   2 mg at 03/05/14 2841   And  . ziprasidone (GEODON) injection 20 mg  20 mg Intramuscular PRN Kristeen Mans, NP      . simvastatin (ZOCOR) tablet 20 mg  20 mg Oral QHS Kristeen Mans, NP   20 mg at 03/04/14 2101  . traZODone (DESYREL) tablet 150 mg  150 mg Oral QHS PRN Kristeen Mans, NP   150 mg at 03/03/14 2106    Lab Results:  No results found for this or any previous visit (from the past 48 hour(s)).  Physical Findings: AIMS: Facial and Oral Movements Muscles of Facial Expression: None, normal Lips and Perioral Area: None, normal Jaw: None, normal Tongue: None, normal,Extremity Movements Upper (arms, wrists, hands, fingers): None,  normal Lower (legs, knees, ankles, toes): None, normal, Trunk Movements Neck, shoulders, hips: None, normal, Overall Severity Severity of abnormal movements (highest score from questions above): None, normal Incapacitation due to abnormal movements: None, normal Patient's awareness of abnormal movements (rate only patient's report): No Awareness, Dental Status Current problems with teeth and/or dentures?: No Does patient usually wear dentures?: No  CIWA:    COWS:     Treatment Plan Summary: Daily contact with patient to assess and evaluate symptoms and progress in treatment Medication management  Plan:  1.  Continue with plan of care and crisis management 2. Continue medication management/ and review as needed 3. Continue taking Buspar 15 mg po three times a day for anxiety  4. Continue  Trazodone 150 mg po at night time for sleep as needed  5. Continue Divalproex 2000 mg at night time for mood stabilization  6. Continue Prozac 60 mg po daily for depression  7. Cogentin 1 mg po at bed time for medication induced Extrapyramidal symptoms  8. Continue Haldol to 10mg  po BID for psychosis and delusions. 9. Encourage to participate in group and individual sessions 10. Discharge plan in progress 11. Valproic acid level on 03/07/14.  Medical Decision Making Problem Points:  Established problem, improving (1), Review of last therapy session (1) and Review of psycho-social stressors (1) Data Points:  Review of medication regiment & side effects (2) Review of new medications or change in dosage (2)  I certify that inpatient services furnished can reasonably be expected to improve the patient's condition.   Thedore MinsAkintayo, Alexavier Tsutsui, MD 03/05/2014, 9:58 AM

## 2014-03-05 NOTE — [facility] Group Notes (Signed)
[facility]  03/05/2014 , 10:18 AM    Type of Therapy:  [facility] [employer] Presentation  Participation Level:  Active  Participation Quality:  Attentive  Affect:  Blunted  Cognitive:  Oriented  Insight:  Limited  Engagement in Therapy:  Engaged  Modes of Intervention:  Discussion, Education and Socialization  Summary of Progress/Problems:  Onalee HuaDavid from [facility] [employer] [facility] came to present his recovery story and play the guitar.  Came in and out of group several times.  Otherwise sat quietly. At one point was crying, and shared that the music made him think of his ex, and how he misses her.  Daryel Geraldorth, Rodney B 03/05/2014 , 10:18 AM

## 2014-03-05 NOTE — [facility] Suicide Risk Assessment (Signed)
[facility] INPATIENT:  Family/Significant Other Suicide Prevention Education  Suicide Prevention Education:  Patient Discharged to Other Healthcare Facility:  Suicide Prevention Education Not Provided: [name] [place]. The patient is discharging to another healthcare facility for continuation of treatment.  The patient's medical information, including suicide ideations and risk factors, are a part of the medical information shared with the receiving healthcare facility.  Daryel Geraldorth, Rodney B 03/05/2014, 11:11 AM

## 2014-03-05 NOTE — Progress Notes (Signed)
Patient ID: [name], male   DOB: July 11, 1991, 23 y.o.   MRN: [id number]  D: Pt. Denies SI/HI and Visual Hallucinations. Patient reports that he is having auditory hallucinations but they are more quiet today. Patient does not report any pain. Patient was experiencing some increased agitation and received PRN medication. Upon reassessment patient's agitation decreased.  A: Support and encouragement provided to the patient to comply with treatment and go to groups. Scheduled medications administered to patient per physician's orders.  R: Patient is somewhat irritable and short with this Clinical research associatewriter but has become more approachable throughout the day. Patient is seen in the milieu at times. Q15 minute checks are maintained for safety.

## 2014-03-05 NOTE — Progress Notes (Signed)
Adult Activity Group Note   Date: 03/05/2014  Time: 2:15P  Group Topic: Increased Cognitive Functioning   Participation Level: Did Not Attend    Activity: Trivia. Patients will be asked to commonalities on ordinary items that have no appeared direct connection.   Additional Comments:

## 2014-03-06 NOTE — Progress Notes (Signed)
[facility] :  Will you be returning to the same living situation after discharge: Yes,  [facility] At discharge, do you have transportation home?:Yes,  staff from above Do you have the ability to pay for your medications:Yes,  MCD  Release of information consent forms completed and in the chart;  Patient's signature needed at discharge.  Patient to Follow up at: Follow-up Information   Follow up with [facility]. (Someone from the team will see you the next day after you return home)    Contact information:   [address]  [place]  [phone]      Patient denies SI/HI:   Yes,  yes    Safety Planning and Suicide Prevention discussed:  Yes,  yes  [name], Rodney B 03/06/2014, 12:34 PM

## 2014-03-06 NOTE — Progress Notes (Signed)
Pt attended karaoke group this evening.  

## 2014-03-06 NOTE — Progress Notes (Signed)
D Pt. Denies SI and HI, no complaints of pain or discomfort noted.  A Writer offered support and encouragement, discussed discharge plans with pt.  R Pt. Remains safe on the unit. Reports he will be returning to the group home tomorrow.

## 2014-03-06 NOTE — Tx Team (Signed)
  Interdisciplinary Treatment Plan Update   Date Reviewed:  03/06/2014  Time Reviewed:  12:28 PM  Progress in Treatment:   Attending groups: Yes Participating in groups: Yes Taking medication as prescribed: Yes  Tolerating medication: Yes Family/Significant other contact made: Yes  Patient understands diagnosis: Yes  Discussing patient identified problems/goals with staff: Yes Medical problems stabilized or resolved: Yes Denies suicidal/homicidal ideation: Yes Patient has not harmed self or others: Yes  For review of initial/current patient goals, please see plan of care.  Estimated Length of Stay:  D/C tomorrow  Reason for Continuation of Hospitalization:   New Problems/Goals identified:  N/A  Discharge Plan or Barriers:   [facility], follow up [facility] team  Additional Comments:  Attendees:  Signature: Thedore MinsMojeed Akintayo, MD 03/06/2014 12:28 PM   Signature: Richelle Itood North, [name] 03/06/2014 12:28 PM  Signature: Fransisca KaufmannLaura Davis, NP 03/06/2014 12:28 PM  Signature: Joslyn Devonaroline Beaudry, RN 03/06/2014 12:28 PM  Signature: Liborio NixonPatrice White, RN 03/06/2014 12:28 PM  Signature:  03/06/2014 12:28 PM  Signature:   03/06/2014 12:28 PM  Signature:    Signature:    Signature:    Signature:    Signature:    Signature:      Scribe for Treatment Team:   Richelle Itood North, [name]  03/06/2014 12:28 PM

## 2014-03-06 NOTE — [facility] Group Notes (Signed)
[facility] Group Notes:  (Counselor/Nursing/MHT/Case Management/Adjunct)  03/06/2014 1:15PM  Type of Therapy:  Group Therapy  Participation Level:  Active  Participation Quality:  Appropriate  Affect:  Flat  Cognitive:  Oriented  Insight:  Improving  Engagement in Group:  Limited  Engagement in Therapy:  Limited  Modes of Intervention:  Discussion, Exploration and Socialization  Summary of Progress/Problems: The topic for group was balance in life.  Pt participated in the discussion about when their life was in balance and out of balance and how this feels.  Pt discussed ways to get back in balance and short term goals they can work on to get where they want to be.  [name] shared, in a very vulnerable way, that he struggles with dealing with both a mental health diagnosis, and the accompanying symptoms.  He stated that he has always had a dream of being a cop, and knows that will never be an option for him now.  Others tried to give him feedback about his positives and channeling his energy towards that, but he was not able to hear that.   Daryel Geraldorth, Rodney B 03/06/2014 2:30 PM

## 2014-03-06 NOTE — Progress Notes (Signed)
Patient ID: [name], male   DOB: 03-20-1991, 23 y.o.   MRN: [id number] [facility]  03/06/2014 11:23 AM [name]  MRN:  [id number] Subjective:  Patient states "The voices are much quieter. I was asking about Seroquel because my friend takes it. I believe my medications are working for me. I am excited about leaving tomorrow."  Objective:  Patient seen this morning and his chart was reviewed with the treatment team members. He is endorsing decreased auditory/visual hallucinations, delusional thinking and depressive symptoms. The patient was assessed in his room late in the morning. He was observed to be sleeping. Patient is documented to have slept 6.75 hours last night. The patient continues to not attend groups regularly. He has been requiring Risperdal M-tab about once daily for feelings of agitation. He has not required any so far today. Patient remains compliant with medications with no adverse effects reported.   Diagnosis:  Schizoaffective disorder-Bipolar type                     Cannabis use disorder.  DSM5: Schizophrenia Disorders:  psychosis Obsessive-Compulsive Disorders:   Trauma-Stressor Disorders:   Substance/Addictive Disorders:  Cannabis Use Disorder - Moderate 9304.30) Depressive Disorders:  Mood swings Total Time spent with patient: 15 minutes  Axis I: Schizoaffective disorder, Bipolar type, Cannabis use disorder Axis II: Deferred Axis III:  Past Medical History  Diagnosis Date  . Hypothyroidism   . Hypercholesterolemia   . Hypertension    Axis IV: other psychosocial or environmental problems and problems related to social environment Axis V: 51-60 moderate symptoms  ADL's:  Intact  Sleep: Good  Appetite:  Good  Psychiatric Specialty Exam: Physical Exam  Psychiatric: His mood appears anxious. His affect is labile. His speech is rapid and/or pressured. He is agitated and actively hallucinating. Thought content is paranoid.  Cognition and memory are normal. He expresses impulsivity.    Review of Systems  Constitutional: Negative.   HENT: Negative.   Eyes: Negative.   Respiratory: Negative.   Cardiovascular: Negative.   Gastrointestinal: Negative.   Genitourinary: Negative.   Musculoskeletal: Negative.   Skin: Negative.   Neurological: Negative.   Endo/Heme/Allergies: Negative.   Psychiatric/Behavioral: Positive for depression, hallucinations and substance abuse (UDS positive for marijuana ). Negative for suicidal ideas and memory loss. The patient is nervous/anxious. The patient does not have insomnia.     Blood pressure 127/81, pulse 89, temperature 98.1 F (36.7 C), temperature source Oral, resp. rate 20, height 5\' 9"  (1.753 m), weight 123.378 kg (272 lb).Body mass index is 40.15 kg/(m^2).  General Appearance: Casual and Fairly Groomed  Patent attorneyye Contact::  Fair  Speech:  Clear and Coherent and Normal Rate  Volume:  Normal  Mood:  Depressed  Affect:  Congruent, Depressed and Flat  Thought Process:  Coherent and Goal Directed  Orientation:  Full (Time, Place, and Person)  Thought Content:  Hallucinations: hears chatter noise, unable to describe what voices are saying  Suicidal Thoughts:  Denies today   Homicidal Thoughts:  No  Memory:  Immediate;   Good Recent;   Good Remote;   Fair  Judgement:  Fair  Insight:  Good  Psychomotor Activity:  Normal  Concentration:  Good  Recall:  NA  Fund of Knowledge:Good  Language: Good  Akathisia:  NA  Handed:  Right  AIMS (if indicated):     Assets:  Communication Skills Desire for Improvement Resilience  Sleep:  Number of Hours: 6.75  Musculoskeletal: Strength & Muscle Tone: within normal limits Gait & Station: normal Patient leans: N/A  Current Medications: Current Facility-Administered Medications  Medication Dose Route Frequency Provider Last Rate Last Dose  . acetaminophen (TYLENOL) tablet 650 mg  650 mg Oral Q6H PRN Kristeen MansFran E Hobson, NP      .  alum & mag hydroxide-simeth (MAALOX/MYLANTA) 200-200-20 MG/5ML suspension 30 mL  30 mL Oral Q4H PRN Kristeen MansFran E Hobson, NP      . benztropine (COGENTIN) tablet 1 mg  1 mg Oral BID Mojeed Akintayo   1 mg at 03/06/14 0818  . busPIRone (BUSPAR) tablet 15 mg  15 mg Oral TID Kristeen MansFran E Hobson, NP   15 mg at 03/06/14 0818  . divalproex (DEPAKOTE) DR tablet 2,000 mg  2,000 mg Oral QHS Kristeen MansFran E Hobson, NP   2,000 mg at 03/05/14 2145  . docusate sodium (COLACE) capsule 100 mg  100 mg Oral BID PRN Fransisca KaufmannLaura Davis, NP   100 mg at 03/05/14 1613  . fenofibrate tablet 160 mg  160 mg Oral q morning - 10a Kristeen MansFran E Hobson, NP   160 mg at 03/06/14 1048  . FLUoxetine (PROZAC) capsule 60 mg  60 mg Oral q morning - 10a Kristeen MansFran E Hobson, NP   60 mg at 03/06/14 1048  . haloperidol (HALDOL) tablet 10 mg  10 mg Oral BID Mojeed Akintayo   10 mg at 03/06/14 0818  . hydrOXYzine (ATARAX/VISTARIL) tablet 50 mg  50 mg Oral Q6H PRN Fransisca KaufmannLaura Davis, NP   50 mg at 03/04/14 2101  . levothyroxine (SYNTHROID, LEVOTHROID) tablet 100 mcg  100 mcg Oral Daily Kristeen MansFran E Hobson, NP   100 mcg at 03/06/14 0818  . propranolol (INDERAL) tablet 10 mg  10 mg Oral BID Kristeen MansFran E Hobson, NP   10 mg at 03/06/14 0818  . risperiDONE (RISPERDAL M-TABS) disintegrating tablet 2 mg  2 mg Oral Q8H PRN Kristeen MansFran E Hobson, NP   2 mg at 03/05/14 [id number]   And  . ziprasidone (GEODON) injection 20 mg  20 mg Intramuscular PRN Kristeen MansFran E Hobson, NP      . simvastatin (ZOCOR) tablet 20 mg  20 mg Oral QHS Kristeen MansFran E Hobson, NP   20 mg at 03/05/14 2145  . traZODone (DESYREL) tablet 150 mg  150 mg Oral QHS PRN Kristeen MansFran E Hobson, NP   150 mg at 03/03/14 2106    Lab Results:  No results found for this or any previous visit (from the past 48 hour(s)).  Physical Findings: AIMS: Facial and Oral Movements Muscles of Facial Expression: None, normal Lips and Perioral Area: None, normal Jaw: None, normal Tongue: None, normal,Extremity Movements Upper (arms, wrists, hands, fingers): None, normal Lower (legs, knees,  ankles, toes): None, normal, Trunk Movements Neck, shoulders, hips: None, normal, Overall Severity Severity of abnormal movements (highest score from questions above): None, normal Incapacitation due to abnormal movements: None, normal Patient's awareness of abnormal movements (rate only patient's report): No Awareness, Dental Status Current problems with teeth and/or dentures?: No Does patient usually wear dentures?: No  CIWA:    COWS:     Treatment Plan Summary: Daily contact with patient to assess and evaluate symptoms and progress in treatment Medication management  Plan:  1.  Continue with plan of care and crisis management 2. Continue medication management/ and review as needed 3. Continue taking Buspar 15 mg po three times a day for anxiety  4. Continue Trazodone 150 mg po at night time for sleep as needed  5. Continue Divalproex 2000 mg at night time for mood stabilization  6. Continue Prozac 60 mg po daily for depression  7. Cogentin 1 mg po at bed time for medication induced Extrapyramidal symptoms  8. Continue Haldol to 10mg  po BID for psychosis and delusions. 9. Encourage to participate in group and individual sessions 10. Discharge plan in progress. Anticipate discharge back to group home tomorrow.  11. Valproic acid level on 03/07/14.  Medical Decision Making Problem Points:  Established problem, improving (1), Review of last therapy session (1) and Review of psycho-social stressors (1) Data Points:  Review or order clinical lab tests (1) Review of medication regiment & side effects (2)  I certify that inpatient services furnished can reasonably be expected to improve the patient's condition.   Fransisca Kaufmann, NP-C 03/06/2014, 11:23 AM    Patient seen, evaluated and I agree with notes by Nurse Practitioner. Thedore Mins, MD

## 2014-03-06 NOTE — Progress Notes (Signed)
Adult Activity Group Note  Date: 03/06/14 Time:9:30am  Group Topic: Art  Participation Level: Did Not Attend   Activity: Patients asked to create art to coincide with daily unit theme using any combination of markers, crayons, color pencils, construction paper, magazine clippings, glue, and scissors.   Additional Comments:

## 2014-03-06 NOTE — Progress Notes (Signed)
Patient ID: [name], male   DOB: [birth year], 23 y.o.   MRN: [id number]  D: Pt complained of constipation. Informed the writer that the colace given to him earlier in the shift "was not working". Stated, "my stomach feels like it has bricks in it". Writer asked pt if he would be willing to take citrate of magnesia. Informed pt of the possibility of loose bowels. Pt consented to the order.   A: pt was given a bottle of citrate for constipation. Support and encouragement was offered. 15 min checks continued for safety.   R: Pt remains safe.

## 2014-03-06 NOTE — Progress Notes (Signed)
Patient ID: Richard HillockChristopher L Gonzales, male   DOB: 21-Feb-1991, 21 y.o.   MRN: [id number]  Time: 2:15p Group: Activity Group  Pt's were given a variety of options to do, crossword puzzles, paper to draw on, pictures to color.  This pt chose to not participate in the art but found the radio his project. "I don't like to draw but like working on computers". This was the first group he had attended with me this week.   Affect: bright Cognitive: Oriented.

## 2014-03-06 NOTE — Progress Notes (Signed)
Patient ID: Richard HillockChristopher L Gonzales, male   DOB: Jan 06, 1991, 23 y.o.   MRN: [id number]  D: Pt. Denies SI/HI and A/V Hallucinations. Patient refused to fill out daily inventory sheet. Patient does not report any pain or discomfort at this time.   A: Support and encouragement provided to the patient. Scheduled medications administered to patient per physician's orders.  R: Patient is receptive and cooperative but minimal. Patient continues to stay in his bed throughout the morning but is attending some groups. Patient is seen in the milieu at times. Q15 minute checks are maintained for safety.

## 2014-03-06 NOTE — Progress Notes (Signed)
Patient ID: Richard HillockChristopher L Gonzales, male   DOB: [birth year], 23 y.o.   MRN: [id number]   The focus of this group is to educate the patient on the purpose and policies of crisis stabilization and provide a format to answer questions about their admission.  The group details unit policies and expectations of patients while admitted.  Patient did not attend group.

## 2014-03-07 LAB — VALPROIC ACID LEVEL: Valproic Acid Lvl: 28.3 ug/mL — ABNORMAL LOW (ref 50.0–100.0)

## 2014-03-07 MED ORDER — SIMVASTATIN 20 MG PO TABS: 20.0000 mg | ORAL_TABLET | Freq: Every day | ORAL | Status: DC

## 2014-03-07 MED ORDER — LEVOTHYROXINE SODIUM 100 MCG PO TABS: 100.0000 ug | ORAL_TABLET | Freq: Every day | ORAL | Status: DC

## 2014-03-07 MED ORDER — FENOFIBRATE 160 MG PO TABS: 160.0000 mg | ORAL_TABLET | Freq: Every morning | ORAL | Status: DC

## 2014-03-07 MED ORDER — BENZTROPINE MESYLATE 1 MG PO TABS: 1.0000 mg | ORAL_TABLET | Freq: Two times a day (BID) | ORAL | Status: DC

## 2014-03-07 MED ORDER — HALOPERIDOL 10 MG PO TABS: 10.0000 mg | ORAL_TABLET | Freq: Two times a day (BID) | ORAL | Status: DC

## 2014-03-07 MED ORDER — DIVALPROEX SODIUM 500 MG PO DR TAB: 2000.0000 mg | DELAYED_RELEASE_TABLET | Freq: Every day | ORAL | Status: DC

## 2014-03-07 MED ORDER — FLUOXETINE HCL 20 MG PO CAPS: 60.0000 mg | ORAL_CAPSULE | Freq: Every morning | ORAL | Status: DC

## 2014-03-07 MED ORDER — TRAZODONE HCL 150 MG PO TABS: 150.0000 mg | ORAL_TABLET | Freq: Every evening | ORAL | Status: DC | PRN

## 2014-03-07 MED ORDER — BUSPIRONE HCL 15 MG PO TABS: 15.0000 mg | ORAL_TABLET | Freq: Three times a day (TID) | ORAL | Status: DC

## 2014-03-07 MED ORDER — HALOPERIDOL DECANOATE 100 MG/ML IM SOLN: 100.0000 mg | INTRAMUSCULAR | Status: DC

## 2014-03-07 MED ORDER — PROPRANOLOL HCL 10 MG PO TABS: 10.0000 mg | ORAL_TABLET | Freq: Two times a day (BID) | ORAL | Status: DC

## 2014-03-07 NOTE — Progress Notes (Signed)
Patient ID: Richard HillockChristopher L Mauri, male   DOB: Feb 21, 1991, 23 y.o.   MRN: [id number] D. Orders received for discharge. Pt verbalized understanding stating '' The guy from the group home is coming at 2 pm'' He states he feels ready for discharge and denies any AH/VH. No signs of acute decompensation. Pt denies any SI.HI. Discharge instructions reviewed, AVS reviewed at length with patient. Crisis services reviewed. Rx given with 7 day free supply of medications. All belongings returned. Pt escorted to lobby to the care of Zach from group home. FL2 provided to staff with packet of discharge information.

## 2014-03-07 NOTE — [facility] Suicide Risk Assessment (Signed)
   Demographic Factors:  Male, Caucasian, Low socioeconomic status, Unemployed and lives in a group home  Total Time spent with patient: 20 minutes  Psychiatric Specialty Exam: Physical Exam  Psychiatric: He has a normal mood and affect. His speech is normal and behavior is normal. Judgment and thought content normal. Cognition and memory are normal.    Review of Systems  Constitutional: Negative.   HENT: Negative.   Eyes: Negative.   Respiratory: Negative.   Cardiovascular: Negative.   Gastrointestinal: Negative.   Genitourinary: Negative.   Musculoskeletal: Negative.   Skin: Negative.   Neurological: Negative.   Endo/Heme/Allergies: Negative.   Psychiatric/Behavioral: Negative.     Blood pressure 126/90, pulse 82, temperature 97 F (36.1 C), temperature source Oral, resp. rate 18, height 5\' 9"  (1.753 m), weight 123.378 kg (272 lb).Body mass index is 40.15 kg/(m^2).  General Appearance: Fairly Groomed  Patent attorneyye Contact::  Good  Speech:  Clear and Coherent  Volume:  Normal  Mood:  Euthymic  Affect:  Appropriate  Thought Process:  Goal Directed  Orientation:  Full (Time, Place, and Person)  Thought Content:  Negative  Suicidal Thoughts:  No  Homicidal Thoughts:  No  Memory:  Immediate;   Fair Recent;   Fair Remote;   Fair  Judgement:  Fair  Insight:  Fair  Psychomotor Activity:  Normal  Concentration:  Fair  Recall:  Fis[employer]Fair  Fund of Knowledge:Fair  Language: Fair  Akathisia:  No  Handed:  Right  AIMS (if indicated):     Assets:  Communication Skills Desire for Improvement Physical Health  Sleep:  Number of Hours: 6.5    Musculoskeletal: Strength & Muscle Tone: within normal limits Gait & Station: normal Patient leans: N/A   Mental Status Per Nursing Assessment::   On Admission:     Current Mental Status by Physician: patient denies suicidal ideation,intent or plan  Loss Factors: Decrease in vocational status  Historical Factors: poor impulse  control  Risk Reduction Factors:   Positive social support and lives in group home  Continued Clinical Symptoms:  Resolution of psychosis and delusions  Cognitive Features That Contribute To Risk:  Closed-mindedness Polarized thinking    Suicide Risk:  Minimal: No identifiable suicidal ideation.  Patients presenting with no risk factors but with morbid ruminations; may be classified as minimal risk based on the severity of the depressive symptoms  Discharge Diagnoses:   AXIS I: Schizoaffective disorder, unspecified condition   AXIS II:  Cluster B Traits AXIS III:   Past Medical History  Diagnosis Date  . Hypothyroidism   . Hypercholesterolemia   . Hypertension    AXIS IV:  other psychosocial or environmental problems and problems related to social environment AXIS V:  61-70 mild symptoms  Plan Of Care/Follow-up recommendations:  Activity:  as tolerated Diet:  healthy Tests:  Valproic acid level Other:  patient to keep her after care appointment  Is patient on multiple antipsychotic therapies at discharge:  No   Has Patient had three or more failed trials of antipsychotic monotherapy by history:  No  Recommended Plan for Multiple Antipsychotic Therapies: NA    [name], Richard Sally, MD 03/07/2014, 9:16 AM

## 2014-03-07 NOTE — Discharge Summary (Signed)
Physician Discharge Summary Note  Patient:  Richard HillockChristopher L Gonzales is an 23 y.o., male MRN:  [id number] DOB:  Nov 24, 1990 Patient phone:  [phone] (home)  Patient address:   [address] [place] [address],  Total Time spent with patient: 20 minutes  Date of Admission:  03/01/2014 Date of Discharge: 03/07/14  Reason for Admission:  Mood lability, Psychosis   Discharge Diagnoses: Active Problems:   Marijuana abuse   Schizophrenia   Schizoaffective disorder, unspecified condition   Psychiatric Specialty Exam: Physical Exam  Review of Systems  Constitutional: Negative.   HENT: Negative.   Eyes: Negative.   Respiratory: Negative.   Cardiovascular: Negative.   Gastrointestinal: Negative.   Genitourinary: Negative.   Musculoskeletal: Negative.   Skin: Negative.   Neurological: Negative.   Endo/Heme/Allergies: Negative.   Psychiatric/Behavioral: Negative.     Blood pressure 126/90, pulse 82, temperature 97 F (36.1 C), temperature source Oral, resp. rate 18, height 5\' 9"  (1.753 m), weight 123.378 kg (272 lb).Body mass index is 40.15 kg/(m^2).  See Physician SRA                                                  Past Psychiatric History: See H&P Diagnosis:  Hospitalizations:  Outpatient Care:  Substance Abuse Care:  Self-Mutilation:  Suicidal Attempts:  Violent Behaviors:   Musculoskeletal: Strength & Muscle Tone: within normal limits Gait & Station: normal Patient leans: N/A  DSM5:  AXIS I: Schizoaffective disorder, unspecified condition  AXIS II: Cluster B Traits  AXIS III:  Past Medical History   Diagnosis  Date   .  Hypothyroidism    .  Hypercholesterolemia    .  Hypertension    AXIS IV: other psychosocial or environmental problems and problems related to social environment  AXIS V: 61-70 mild symptoms  Level of Care:  OP  Hospital Course: [name] is a  23 year old caucasian male with a hx of Paranoid  Schizophrenia [facility]o feeling depressed, suicidal thought and auditory hallucination. [facility] and was discharged home to an assisted living facility. Patient reported not feeling like himself in the past three weeks and feeling depressed to the extent he was afraid he was going to hurt himself. Patient has a hx of previous suicide attempt by OD on medications in 2009 and cutting self in the past. Patient reported hearing voices but could not make out what the voices were saying to him. Patient also admitted to using Marijuana at the ALF occasionally and drinks alcohol too. His UDS is positive for Marijuana.  Patient reported good sleep and appetite. He stated he will be going back to his assisted living facility after discharge.         [name] was admitted to the adult 400 unit. He was evaluated and his symptoms were identified. Medication management was discussed and initiated. His home medications were resumed to include Depakote, Buspar, Haldol, and Inderal. The patient was ordered Risperdal M-tab for bouts of agitation. [name] would become agitated at times and engage in self harm behaviors such as punching the wall.  He was oriented to the unit and encouraged to participate in unit programming. Medical problems were identified and treated appropriately. Home medication was restarted as needed.  The patient was evaluated each day by a clinical provider to ascertain the patient's response to treatment. Patient was resistant to attending groups. He was often found sleeping in his room. The patient was noted to also have body odor due to not showering regularly.  Improvement was noted by the patient's report of decreasing symptoms, improved sleep and appetite, affect, medication tolerance, behavior, and participation in unit programming.  He was asked each day to complete a self inventory noting mood, mental status,  pain, new symptoms, anxiety and concerns.         He responded well to medication and being in a therapeutic and supportive environment. Positive and appropriate behavior was noted and the patient was motivated for recovery.  The patient worked closely with the treatment team and case manager to develop a discharge plan with appropriate goals. Coping skills, problem solving as well as relaxation therapies were also part of the unit programming.         By the day of discharge he was in much improved condition than upon admission.  Symptoms were reported as significantly decreased or resolved completely.  The patient denied SI/HI and voiced no AVH. He was motivated to continue taking medication with a goal of continued improvement in mental health. [name] was discharged home with a plan to follow up as noted below. The patient returned to [facility] in [place] upon discharge.   Consults:  None  Significant Diagnostic Studies:  Chem profile, CBC, UDS positive for marijuana, Depakote level   Discharge Vitals:   Blood pressure 126/90, pulse 82, temperature 97 F (36.1 C), temperature source Oral, resp. rate 18, height 5\' 9"  (1.753 m), weight 123.378 kg (272 lb). Body mass index is 40.15 kg/(m^2). Lab Results:   Results for orders placed during the hospital encounter of 03/01/14 (from the past 72 hour(s))  VALPROIC ACID LEVEL     Status: Abnormal   Collection Time    03/07/14  6:20 AM      Result Value Ref Range   Valproic Acid Lvl 28.3 (*) 50.0 - 100.0 ug/mL   Comment: Performed at [facility]    Physical Findings: AIMS: Facial and Oral Movements Muscles of Facial Expression: None, normal Lips and Perioral Area: None, normal Jaw: None, normal Tongue: None, normal,Extremity Movements Upper (arms, wrists, hands, fingers): None, normal Lower (legs, knees, ankles, toes): None, normal, Trunk Movements Neck, shoulders, hips: None, normal, Overall  Severity Severity of abnormal movements (highest score from questions above): None, normal Incapacitation due to abnormal movements: None, normal Patient's awareness of abnormal movements (rate only patient's report): No Awareness, Dental Status Current problems with teeth and/or dentures?: No Does patient usually wear dentures?: No  CIWA:    COWS:     Psychiatric Specialty Exam: See Psychiatric Specialty Exam and Suicide Risk Assessment completed by Attending Physician prior to discharge.  Discharge destination:  ALF  Is patient on multiple antipsychotic therapies at discharge:  No   Has Patient had three or more failed trials of antipsychotic monotherapy by history:  No  Recommended Plan for Multiple Antipsychotic Therapies: NA     Medication List       Indication   benztropine 1 MG tablet  Commonly known as:  COGENTIN  Take 1 tablet (1 mg total) by mouth 2 (two) times daily.   Indication:  Extrapyramidal Reaction caused by Medications     busPIRone 15 MG tablet  Commonly known as:  BUSPAR  Take 1 tablet (  15 mg total) by mouth 3 (three) times daily.   Indication:  Anxiety Disorder     divalproex 500 MG DR tablet  Commonly known as:  DEPAKOTE  Take 4 tablets (2,000 mg total) by mouth at bedtime.   Indication:  Mood lability     fenofibrate 160 MG tablet  Take 1 tablet (160 mg total) by mouth every morning.   Indication:  Inherited Heterozygous Hypercholesterolemia     FLUoxetine 20 MG capsule  Commonly known as:  PROZAC  Take 3 capsules (60 mg total) by mouth every morning.   Indication:  Depression     haloperidol 10 MG tablet  Commonly known as:  HALDOL  Take 1 tablet (10 mg total) by mouth 2 (two) times daily.   Indication:  Psychosis, Schizophrenia     haloperidol decanoate 100 MG/ML injection  Commonly known as:  HALDOL DECANOATE  Inject 1 mL (100 mg total) into the muscle every 28 (twenty-eight) days. Last received 02/28/14   Indication:  Psychosis      levothyroxine 100 MCG tablet  Commonly known as:  SYNTHROID, LEVOTHROID  Take 1 tablet (100 mcg total) by mouth daily.   Indication:  Underactive Thyroid     propranolol 10 MG tablet  Commonly known as:  INDERAL  Take 1 tablet (10 mg total) by mouth 2 (two) times daily.   Indication:  High Blood Pressure     simvastatin 20 MG tablet  Commonly known as:  ZOCOR  Take 1 tablet (20 mg total) by mouth at bedtime.   Indication:  Cerebrovascular Accident or Stroke     traZODone 150 MG tablet  Commonly known as:  DESYREL  Take 1 tablet (150 mg total) by mouth at bedtime as needed for sleep.   Indication:  Trouble Sleeping           Follow-up Information   Follow up with [facility]. (Someone from the team will see you the next day after you return home)    Contact information:   [address]  [place]  [phone]      Follow-up recommendations:   Activity: as tolerated  Diet: healthy  Tests: Valproic acid level  Other: patient to keep her after care appointment   Comments:  Take all your medications as prescribed by your mental healthcare provider.  Report any adverse effects and or reactions from your medicines to your outpatient provider promptly.  Patient is instructed and cautioned to not engage in alcohol and or illegal drug use while on prescription medicines.  In the event of worsening symptoms, patient is instructed to call the crisis hotline, 911 and or go to the nearest ED for appropriate evaluation and treatment of symptoms.  Follow-up with your primary care provider for your other medical issues, concerns and or health care needs.   Total Discharge Time:  Greater than 30 minutes.  SignedFransisca Kaufmann: DAVIS, LAURA NP-C 03/07/2014, 9:21 AM  Patient seen, evaluated and I agree with notes by Nurse Practitioner. Thedore MinsMojeed Akintayo, MD

## 2014-03-11 NOTE — Progress Notes (Signed)
Patient Discharge Instructions:  After Visit Summary (AVS):   Faxed to:  03/11/14 Discharge Summary Note:   Faxed to:  03/11/14 Psychiatric Admission Assessment Note:   Faxed to:  03/11/14 Suicide Risk Assessment - Discharge Assessment:   Faxed to:  03/11/14 Faxed/Sent to the Next Level Care provider:  03/11/14 [facility] @ [phone] Jerelene ReddenSheena E Towanda, 03/11/2014, 3:34 PM

## 2014-06-19 ENCOUNTER — Emergency Department: Payer: Self-pay | Admitting: Internal Medicine

## 2014-06-19 LAB — COMPREHENSIVE METABOLIC PANEL
ALT: 68 U/L — AB
AST: 32 U/L (ref 15–37)
Albumin: 3.8 g/dL (ref 3.4–5.0)
Alkaline Phosphatase: 93 U/L
Anion Gap: 7 (ref 7–16)
BILIRUBIN TOTAL: 0.4 mg/dL (ref 0.2–1.0)
BUN: 11 mg/dL (ref 7–18)
CALCIUM: 8.7 mg/dL (ref 8.5–10.1)
CHLORIDE: 112 mmol/L — AB (ref 98–107)
CREATININE: 0.75 mg/dL (ref 0.60–1.30)
Co2: 22 mmol/L (ref 21–32)
EGFR (African American): >60
EGFR (Non-African Amer.): >60
Glucose: 98 mg/dL (ref 65–99)
Osmolality: 281 (ref 275–301)
POTASSIUM: 3.9 mmol/L (ref 3.5–5.1)
Sodium: 141 mmol/L (ref 136–145)
Total Protein: 7 g/dL (ref 6.4–8.2)

## 2014-06-19 LAB — URINALYSIS, COMPLETE
BILIRUBIN, UR: NEGATIVE
Bacteria: NONE SEEN
Blood: NEGATIVE
Glucose,UR: NEGATIVE mg/dL (ref 0–75)
Ketone: NEGATIVE
LEUKOCYTE ESTERASE: NEGATIVE
NITRITE: NEGATIVE
Ph: 5 (ref 4.5–8.0)
RBC,UR: NONE SEEN /HPF (ref 0–5)
SPECIFIC GRAVITY: 1.031 (ref 1.003–1.030)
Squamous Epithelial: 2
WBC UR: NONE SEEN /HPF (ref 0–5)

## 2014-06-19 LAB — DRUG SCREEN, URINE
Amphetamines, Ur Screen: NEGATIVE (ref ?–1000)
Barbiturates, Ur Screen: NEGATIVE (ref ?–200)
Benzodiazepine, Ur Scrn: NEGATIVE (ref ?–200)
CANNABINOID 50 NG, UR ~~LOC~~: POSITIVE (ref ?–50)
COCAINE METABOLITE, UR ~~LOC~~: NEGATIVE (ref ?–300)
MDMA (Ecstasy)Ur Screen: NEGATIVE (ref ?–500)
Methadone, Ur Screen: NEGATIVE (ref ?–300)
OPIATE, UR SCREEN: NEGATIVE (ref ?–300)
Phencyclidine (PCP) Ur S: NEGATIVE (ref ?–25)
Tricyclic, Ur Screen: NEGATIVE (ref ?–1000)

## 2014-06-19 LAB — TSH: THYROID STIMULATING HORM: 3.23 u[IU]/mL

## 2014-06-19 LAB — CBC
HCT: 46.9 % (ref 40.0–52.0)
HGB: 15.4 g/dL (ref 13.0–18.0)
MCH: 30.7 pg (ref 26.0–34.0)
MCHC: 32.8 g/dL (ref 32.0–36.0)
MCV: 94 fL (ref 80–100)
Platelet: 193 10*3/uL (ref 150–440)
RBC: 5.01 10*6/uL (ref 4.40–5.90)
RDW: 13.2 % (ref 11.5–14.5)
WBC: 8.3 10*3/uL (ref 3.8–10.6)

## 2014-06-19 LAB — ACETAMINOPHEN LEVEL: Acetaminophen: <2 ug/mL

## 2014-06-19 LAB — ETHANOL

## 2014-06-19 LAB — SALICYLATE LEVEL: SALICYLATES, SERUM: 4.1 mg/dL — AB

## 2014-06-19 LAB — VALPROIC ACID LEVEL: Valproic Acid: <3 ug/mL — ABNORMAL LOW

## 2014-06-28 LAB — URINALYSIS, COMPLETE
Bacteria: NONE SEEN
Bilirubin,UR: NEGATIVE
Blood: NEGATIVE
Glucose,UR: NEGATIVE mg/dL (ref 0–75)
KETONE: NEGATIVE
LEUKOCYTE ESTERASE: NEGATIVE
NITRITE: NEGATIVE
Ph: 5 (ref 4.5–8.0)
Protein: NEGATIVE
RBC,UR: <1 /HPF (ref 0–5)
Specific Gravity: 1.029 (ref 1.003–1.030)
WBC UR: 1 /HPF (ref 0–5)

## 2014-06-28 LAB — DRUG SCREEN, URINE
Amphetamines, Ur Screen: NEGATIVE (ref ?–1000)
Barbiturates, Ur Screen: NEGATIVE (ref ?–200)
Benzodiazepine, Ur Scrn: NEGATIVE (ref ?–200)
Cannabinoid 50 Ng, Ur ~~LOC~~: POSITIVE (ref ?–50)
Cocaine Metabolite,Ur ~~LOC~~: NEGATIVE (ref ?–300)
MDMA (ECSTASY) UR SCREEN: NEGATIVE (ref ?–500)
METHADONE, UR SCREEN: NEGATIVE (ref ?–300)
Opiate, Ur Screen: NEGATIVE (ref ?–300)
Phencyclidine (PCP) Ur S: NEGATIVE (ref ?–25)
TRICYCLIC, UR SCREEN: NEGATIVE (ref ?–1000)

## 2014-06-28 LAB — COMPREHENSIVE METABOLIC PANEL
ALT: 82 U/L — AB
Albumin: 3.7 g/dL (ref 3.4–5.0)
Alkaline Phosphatase: 106 U/L
Anion Gap: 8 (ref 7–16)
BILIRUBIN TOTAL: 0.3 mg/dL (ref 0.2–1.0)
BUN: 14 mg/dL (ref 7–18)
CALCIUM: 8.3 mg/dL — AB (ref 8.5–10.1)
Chloride: 109 mmol/L — ABNORMAL HIGH (ref 98–107)
Co2: 25 mmol/L (ref 21–32)
Creatinine: 0.95 mg/dL (ref 0.60–1.30)
EGFR (African American): >60
EGFR (Non-African Amer.): >60
Glucose: 107 mg/dL — ABNORMAL HIGH (ref 65–99)
Osmolality: 284 (ref 275–301)
Potassium: 3.8 mmol/L (ref 3.5–5.1)
SGOT(AST): 37 U/L (ref 15–37)
SODIUM: 142 mmol/L (ref 136–145)
Total Protein: 7.1 g/dL (ref 6.4–8.2)

## 2014-06-28 LAB — CBC
HCT: 47.4 % (ref 40.0–52.0)
HGB: 16.1 g/dL (ref 13.0–18.0)
MCH: 31.4 pg (ref 26.0–34.0)
MCHC: 33.9 g/dL (ref 32.0–36.0)
MCV: 93 fL (ref 80–100)
Platelet: 235 10*3/uL (ref 150–440)
RBC: 5.11 10*6/uL (ref 4.40–5.90)
RDW: 13.1 % (ref 11.5–14.5)
WBC: 8.8 10*3/uL (ref 3.8–10.6)

## 2014-06-28 LAB — ETHANOL

## 2014-06-28 LAB — ACETAMINOPHEN LEVEL: Acetaminophen: <2 ug/mL

## 2014-06-28 LAB — SALICYLATE LEVEL: SALICYLATES, SERUM: 3.2 mg/dL — AB

## 2014-06-29 ENCOUNTER — Inpatient Hospital Stay: Payer: Self-pay | Admitting: Psychiatry

## 2014-07-01 DIAGNOSIS — F209 Schizophrenia, unspecified: Secondary | ICD-10-CM

## 2014-07-02 LAB — VALPROIC ACID LEVEL: Valproic Acid: 16 ug/mL — ABNORMAL LOW

## 2014-07-17 ENCOUNTER — Emergency Department: Payer: Self-pay | Admitting: Emergency Medicine

## 2014-07-17 LAB — CBC
HCT: 45.9 % (ref 40.0–52.0)
HGB: 15.6 g/dL (ref 13.0–18.0)
MCH: 31.7 pg (ref 26.0–34.0)
MCHC: 33.9 g/dL (ref 32.0–36.0)
MCV: 93 fL (ref 80–100)
Platelet: 193 10*3/uL (ref 150–440)
RBC: 4.92 10*6/uL (ref 4.40–5.90)
RDW: 12.6 % (ref 11.5–14.5)
WBC: 9.1 10*3/uL (ref 3.8–10.6)

## 2014-07-17 LAB — BASIC METABOLIC PANEL
Anion Gap: 9 (ref 7–16)
BUN: 13 mg/dL (ref 7–18)
CHLORIDE: 108 mmol/L — AB (ref 98–107)
Calcium, Total: 8.5 mg/dL (ref 8.5–10.1)
Co2: 26 mmol/L (ref 21–32)
Creatinine: 0.91 mg/dL (ref 0.60–1.30)
EGFR (African American): >60
EGFR (Non-African Amer.): >60
Glucose: 131 mg/dL — ABNORMAL HIGH (ref 65–99)
Osmolality: 287 (ref 275–301)
Potassium: 3.5 mmol/L (ref 3.5–5.1)
Sodium: 143 mmol/L (ref 136–145)

## 2014-07-17 LAB — MAGNESIUM: Magnesium: 1.7 mg/dL — ABNORMAL LOW

## 2014-11-03 ENCOUNTER — Emergency Department (HOSPITAL_COMMUNITY)
Admission: EM | Admit: 2014-11-03 | Discharge: 2014-11-04 | Disposition: A | Discharge status: Home / Self Care | Payer: Federal, State, Local not specified - PPO | Attending: Emergency Medicine | Admitting: Emergency Medicine

## 2014-11-03 ENCOUNTER — Encounter (HOSPITAL_COMMUNITY): Payer: Self-pay | Admitting: *Deleted

## 2014-11-03 DIAGNOSIS — I10 Essential (primary) hypertension: Secondary | ICD-10-CM | POA: Insufficient documentation

## 2014-11-03 DIAGNOSIS — R45851 Suicidal ideations: Secondary | ICD-10-CM | POA: Diagnosis not present

## 2014-11-03 DIAGNOSIS — E039 Hypothyroidism, unspecified: Secondary | ICD-10-CM | POA: Insufficient documentation

## 2014-11-03 DIAGNOSIS — E780 Pure hypercholesterolemia: Secondary | ICD-10-CM | POA: Diagnosis not present

## 2014-11-03 DIAGNOSIS — F329 Major depressive disorder, single episode, unspecified: Secondary | ICD-10-CM | POA: Insufficient documentation

## 2014-11-03 DIAGNOSIS — F3160 Bipolar disorder, current episode mixed, unspecified: Secondary | ICD-10-CM | POA: Diagnosis not present

## 2014-11-03 DIAGNOSIS — F32A Depression, unspecified: Secondary | ICD-10-CM

## 2014-11-03 DIAGNOSIS — F1210 Cannabis abuse, uncomplicated: Secondary | ICD-10-CM | POA: Diagnosis not present

## 2014-11-03 DIAGNOSIS — Z008 Encounter for other general examination: Secondary | ICD-10-CM | POA: Diagnosis present

## 2014-11-03 DIAGNOSIS — Z79899 Other long term (current) drug therapy: Secondary | ICD-10-CM | POA: Diagnosis not present

## 2014-11-03 DIAGNOSIS — Z720 Tobacco use: Secondary | ICD-10-CM | POA: Insufficient documentation

## 2014-11-03 LAB — COMPREHENSIVE METABOLIC PANEL
ALBUMIN: 4.2 g/dL (ref 3.5–5.2)
ALT: 54 U/L — ABNORMAL HIGH (ref 0–53)
AST: 27 U/L (ref 0–37)
Alkaline Phosphatase: 79 U/L (ref 39–117)
Anion gap: 3 — ABNORMAL LOW (ref 5–15)
BILIRUBIN TOTAL: 0.6 mg/dL (ref 0.3–1.2)
BUN: 10 mg/dL (ref 6–23)
CALCIUM: 9.1 mg/dL (ref 8.4–10.5)
CHLORIDE: 110 mmol/L (ref 96–112)
CO2: 25 mmol/L (ref 19–32)
Creatinine, Ser: 0.68 mg/dL (ref 0.50–1.35)
GLUCOSE: 95 mg/dL (ref 70–99)
Potassium: 3.8 mmol/L (ref 3.5–5.1)
SODIUM: 138 mmol/L (ref 135–145)
Total Protein: 7 g/dL (ref 6.0–8.3)

## 2014-11-03 LAB — CBC
HCT: 43.5 % (ref 39.0–52.0)
Hemoglobin: 14.9 g/dL (ref 13.0–17.0)
MCH: 31.1 pg (ref 26.0–34.0)
MCHC: 34.3 g/dL (ref 30.0–36.0)
MCV: 90.8 fL (ref 78.0–100.0)
Platelets: 205 10*3/uL (ref 150–400)
RBC: 4.79 MIL/uL (ref 4.22–5.81)
RDW: 13.3 % (ref 11.5–15.5)
WBC: 8.2 10*3/uL (ref 4.0–10.5)

## 2014-11-03 LAB — VALPROIC ACID LEVEL: Valproic Acid Lvl: <10 ug/mL — ABNORMAL LOW (ref 50.0–100.0)

## 2014-11-03 LAB — RAPID URINE DRUG SCREEN, HOSP PERFORMED
AMPHETAMINES: NOT DETECTED
Barbiturates: NOT DETECTED
Benzodiazepines: NOT DETECTED
Cocaine: NOT DETECTED
OPIATES: NOT DETECTED
TETRAHYDROCANNABINOL: POSITIVE — AB

## 2014-11-03 LAB — ACETAMINOPHEN LEVEL: Acetaminophen (Tylenol), Serum: <10 ug/mL — ABNORMAL LOW (ref 10–30)

## 2014-11-03 LAB — ETHANOL

## 2014-11-03 LAB — SALICYLATE LEVEL: Salicylate Lvl: <4 mg/dL (ref 2.8–20.0)

## 2014-11-03 LAB — TSH: TSH: 1.163 u[IU]/mL (ref 0.350–4.500)

## 2014-11-03 MED ORDER — SIMVASTATIN 10 MG PO TABS
20.0000 mg | ORAL_TABLET | Freq: Every day | ORAL | Status: DC
  Administered 2014-11-03: 20 mg via ORAL
  Filled 2014-11-03: qty 2

## 2014-11-03 MED ORDER — LEVOTHYROXINE SODIUM 50 MCG PO TABS
100.0000 ug | ORAL_TABLET | Freq: Every day | ORAL | Status: DC
  Administered 2014-11-03 – 2014-11-04 (×2): 100 ug via ORAL
  Filled 2014-11-03 (×2): qty 2

## 2014-11-03 MED ORDER — HALOPERIDOL 5 MG PO TABS
10.0000 mg | ORAL_TABLET | Freq: Two times a day (BID) | ORAL | Status: DC
  Administered 2014-11-03 – 2014-11-04 (×3): 10 mg via ORAL
  Filled 2014-11-03 (×3): qty 2

## 2014-11-03 MED ORDER — ALUM & MAG HYDROXIDE-SIMETH 200-200-20 MG/5ML PO SUSP: 30.0000 mL | ORAL | Status: DC | PRN

## 2014-11-03 MED ORDER — ACETAMINOPHEN 325 MG PO TABS: 650.0000 mg | ORAL_TABLET | ORAL | Status: DC | PRN

## 2014-11-03 MED ORDER — FLUOXETINE HCL 20 MG PO CAPS
60.0000 mg | ORAL_CAPSULE | Freq: Every morning | ORAL | Status: DC
  Administered 2014-11-04: 60 mg via ORAL
  Filled 2014-11-03: qty 3

## 2014-11-03 MED ORDER — NICOTINE 21 MG/24HR TD PT24
21.0000 mg | MEDICATED_PATCH | Freq: Once | TRANSDERMAL | Status: DC
  Administered 2014-11-03: 21 mg via TRANSDERMAL
  Filled 2014-11-03: qty 1

## 2014-11-03 MED ORDER — TRAZODONE HCL 50 MG PO TABS: 150.0000 mg | ORAL_TABLET | Freq: Every evening | ORAL | Status: DC | PRN

## 2014-11-03 MED ORDER — BUSPIRONE HCL 15 MG PO TABS
15.0000 mg | ORAL_TABLET | Freq: Three times a day (TID) | ORAL | Status: DC
  Administered 2014-11-03 – 2014-11-04 (×3): 15 mg via ORAL
  Filled 2014-11-03 (×9): qty 1

## 2014-11-03 MED ORDER — DIVALPROEX SODIUM 250 MG PO DR TAB
2000.0000 mg | DELAYED_RELEASE_TABLET | Freq: Every day | ORAL | Status: DC
  Administered 2014-11-03: 2000 mg via ORAL
  Filled 2014-11-03: qty 8

## 2014-11-03 MED ORDER — BENZTROPINE MESYLATE 1 MG PO TABS
1.0000 mg | ORAL_TABLET | Freq: Two times a day (BID) | ORAL | Status: DC
  Administered 2014-11-03 – 2014-11-04 (×3): 1 mg via ORAL
  Filled 2014-11-03 (×3): qty 1

## 2014-11-03 MED ORDER — LORAZEPAM 1 MG PO TABS: 1.0000 mg | ORAL_TABLET | Freq: Four times a day (QID) | ORAL | Status: DC | PRN

## 2014-11-03 MED ORDER — ONDANSETRON HCL 4 MG PO TABS: 4.0000 mg | ORAL_TABLET | Freq: Three times a day (TID) | ORAL | Status: DC | PRN

## 2014-11-03 MED ORDER — PROPRANOLOL HCL 10 MG PO TABS
10.0000 mg | ORAL_TABLET | Freq: Two times a day (BID) | ORAL | Status: DC
  Administered 2014-11-03 – 2014-11-04 (×3): 10 mg via ORAL
  Filled 2014-11-03 (×3): qty 1

## 2014-11-03 MED ORDER — IBUPROFEN 400 MG PO TABS: 600.0000 mg | ORAL_TABLET | Freq: Three times a day (TID) | ORAL | Status: DC | PRN

## 2014-11-03 NOTE — BH Assessment (Addendum)
Tele Assessment Note   [name] is an 24 y.o. male who presents reporting he recently relocated from [place] and moved otu of a group home to live with his friend.  When he moved, he was no longer followed by his ACT team, [facility], and has been off his medications for 2 weeks.  He reports worsening depression with racing thoughts and suicidal ideation.  He states he's had the suicidal feelings for 2 days and has a history of one attempt by overdose at age 86 for which he cannot recall the trigger.  HE reports many people in his family have borderline personality disorder and his mother also has schizophrenia and his sister has bipolar disorder.  He reports drinking 6 beers and a fifth of whisky daily and marijuana use several times per week.  He reports no intent to end his life and also reports he has no means to do so, but states he cannot contract for safety because he has been drinking and "who knows what I'll do when I'm drunk."  He presents with feelings of irritability, fatigue, insomnia, guilt, decreased concentration, and decreased memory.  Consulted with Claudette Head, [facility] NP, who recommends overnight observation with the attempt to reconnect him to outpatient services.  Axis I: Bipolar, Depressed Axis II: Deferred Axis III:  Past Medical History  Diagnosis Date  . Schizophrenia   . Hypothyroidism   . Hypercholesterolemia   . Hypertension   . Depression    Axis IV: economic problems, problems with access to health care services and problems with primary support group Axis V: 41-50 serious symptoms  Past Medical History:  Past Medical History  Diagnosis Date  . Schizophrenia   . Hypothyroidism   . Hypercholesterolemia   . Hypertension   . Depression     Past Surgical History  Procedure Laterality Date  . Tonsillectomy    . Cardiac surgery      for Epstein's abnormality at 24yo    Family History: No family history on file.  Social History:   reports that he has been smoking Cigarettes.  He has been smoking about 1.00 pack per day. He does not have any smokeless tobacco history on file. He reports that he drinks alcohol. He reports that he uses illicit drugs (Marijuana).  Additional Social History:  Alcohol / Drug Use History of alcohol / drug use?: Yes Substance #1 Name of Substance 1: Alcohol 1 - Age of First Use: 19 1 - Amount (size/oz): 6 pack plus a fifth of whisky 1 - Frequency: daily 1 - Duration: ongoing 1 - Last Use / Amount: 11/02/14 couple of shots Substance #2 Name of Substance 2: Marijuana 2 - Age of First Use: teens 2 - Amount (size/oz): a blunt and a bowl 2 - Frequency: every now and then 2 - Duration: ongoing 2 - Last Use / Amount: 10/29/14  CIWA: CIWA-Ar BP: 151/97 mmHg Pulse Rate: 75 COWS:    PATIENT STRENGTHS: (choose at least two) General fund of knowledge Supportive family/friends  Allergies: No Known Allergies  Home Medications:  (Not in a hospital admission)  OB/GYN Status:  No LMP for male patient.  General Assessment Data Location of Assessment: [facility] Is this a Tele or Face-to-Face Assessment?: Tele Assessment Is this an Initial Assessment or a Re-assessment for this encounter?: Initial Assessment Living Arrangements: Non-relatives/Friends Can pt return to current living arrangement?: Yes Admission Status: Voluntary Is patient capable of signing voluntary admission?: Yes Transfer from: Home Referral  Source: Self/Family/Friend     [facility] Living Arrangements: Non-relatives/Friends  Education Status Is patient currently in school?: No Highest grade of school patient has completed: 8  Risk to self with the past 6 months Suicidal Ideation: Yes-Currently Present Suicidal Intent: No Is patient at risk for suicide?: No Suicidal Plan?: No Access to Means: No What has been your use of drugs/alcohol within the last 12 months?: smokes marijuana Previous  Attempts/Gestures: Yes How many times?: 1 (age 24 OD on clonidine) Triggers for Past Attempts: Unknown Intentional Self Injurious Behavior: Cutting Comment - Self Injurious Behavior: last cut at age 24 Family Suicide History: No (mother has schizophrenia, borderline personality) Recent stressful life event(s): Turmoil (Comment) (move, no meds) Persecutory voices/beliefs?: No Depression: Yes Depression Symptoms: Feeling worthless/self pity, Feeling angry/irritable, Guilt, Fatigue, Insomnia Substance abuse history and/or treatment for substance abuse?: No Suicide prevention information given to non-admitted patients: Not applicable  Risk to Others within the past 6 months Homicidal Ideation: No Thoughts of Harm to Others: No Current Homicidal Intent: No Current Homicidal Plan: No Access to Homicidal Means: No History of harm to others?: No Assessment of Violence: None Noted Does patient have access to weapons?: No Criminal Charges Pending?: No Does patient have a court date: No  Psychosis Hallucinations: None noted Delusions: None noted  Mental Status Report Appear/Hygiene: Disheveled Eye Contact: Good Motor Activity: Freedom of movement Speech: Logical/coherent Level of Consciousness: Alert Mood: Depressed Affect: Blunted Anxiety Level: None Thought Processes: Coherent, Relevant Judgement: Unimpaired Orientation: Person, Place, Time, Situation Obsessive Compulsive Thoughts/Behaviors: None  Cognitive Functioning Concentration: Decreased Memory: Recent Impaired, Remote Impaired IQ: Average Insight: Fair Impulse Control: Fair Appetite: Good Weight Loss: 30 (in 2 mos) Weight Gain: 0 Sleep: Decreased Total Hours of Sleep: 5 Vegetative Symptoms: None  ADLScreening [facility] Assessment Services) Patient's cognitive ability adequate to safely complete daily activities?: Yes Patient able to express need for assistance with ADLs?: Yes Independently performs ADLs?: Yes  (appropriate for developmental age)  Prior Inpatient Therapy Prior Inpatient Therapy: Yes Prior Therapy Dates: since age 24, most recent 92015 Prior Therapy Facilty/Provider(s): [facility] Reason for Treatment: SI,   Prior Outpatient Therapy Prior Outpatient Therapy: Yes Prior Therapy Dates: until January 2016 Prior [facility] Reason for Treatment: depression, bipolar disorder  ADL Screening (condition at time of admission) Patient's cognitive ability adequate to safely complete daily activities?: Yes Patient able to express need for assistance with ADLs?: Yes Independently performs ADLs?: Yes (appropriate for developmental age)             Merchant navy officerAdvance Directives (For Healthcare) Does patient have an advance directive?: No Would patient like information on creating an advanced directive?: No - patient declined information    Additional Information 1:1 In Past 12 Months?: No CIRT Risk: No Elopement Risk: No Does patient have medical clearance?: Yes     Disposition:  Disposition Initial Assessment Completed for this Encounter: Yes  Steward RosDavee Lomax, Vartan Kerins Marie 11/03/2014 1:57 PM

## 2014-11-03 NOTE — ED Provider Notes (Signed)
CSN: [id number]     Arrival date & time 11/03/14  1241 History   First MD Initiated Contact with Patient 11/03/14 1254     Chief Complaint  Patient presents with  . Medical Clearance     (Consider location/radiation/quality/duration/timing/severity/associated sxs/prior Treatment) HPI Comments: Patient presents to the ER for evaluation of depression and suicidal ideation. Patient reports that he has been off his medications for approximately 2 weeks. Patient reports that he has developed racing thoughts and thoughts about harming himself. He has not identified a plan, but is concerned that he will hurt himself. Symptoms have worsened over the last 1 or 2 days. He is not suicidal. Denies any other illness.   Past Medical History  Diagnosis Date  . Schizophrenia   . Hypothyroidism   . Hypercholesterolemia   . Hypertension   . Depression    Past Surgical History  Procedure Laterality Date  . Tonsillectomy    . Cardiac surgery      for Epstein's abnormality at 24yo   No family history on file. History  Substance Use Topics  . Smoking status: Current Every Day Smoker -- 1.00 packs/day    Types: Cigarettes  . Smokeless tobacco: Not on file  . Alcohol Use: Yes     Comment: occ.    Review of Systems  Psychiatric/Behavioral: Positive for suicidal ideas and dysphoric mood.  All other systems reviewed and are negative.     Allergies  Review of patient's allergies indicates no known allergies.  Home Medications   Prior to Admission medications   Medication Sig Start Date End Date Taking? Authorizing Provider  benztropine (COGENTIN) 1 MG tablet Take 1 tablet (1 mg total) by mouth 2 (two) times daily. 03/07/14   Fransisca KaufmannLaura Davis, NP  busPIRone (BUSPAR) 15 MG tablet Take 1 tablet (15 mg total) by mouth 3 (three) times daily. 03/07/14   Fransisca KaufmannLaura Davis, NP  divalproex (DEPAKOTE) 500 MG DR tablet Take 4 tablets (2,000 mg total) by mouth at bedtime. 03/07/14   Fransisca KaufmannLaura Davis, NP  fenofibrate 160  MG tablet Take 1 tablet (160 mg total) by mouth every morning. 03/07/14   Fransisca KaufmannLaura Davis, NP  FLUoxetine (PROZAC) 20 MG capsule Take 3 capsules (60 mg total) by mouth every morning. 03/07/14   Fransisca KaufmannLaura Davis, NP  haloperidol (HALDOL) 10 MG tablet Take 1 tablet (10 mg total) by mouth 2 (two) times daily. 03/07/14   Fransisca KaufmannLaura Davis, NP  haloperidol decanoate (HALDOL DECANOATE) 100 MG/ML injection Inject 1 mL (100 mg total) into the muscle every 28 (twenty-eight) days. Last received 02/28/14 03/07/14   Fransisca KaufmannLaura Davis, NP  levothyroxine (SYNTHROID, LEVOTHROID) 100 MCG tablet Take 1 tablet (100 mcg total) by mouth daily. 03/07/14   Fransisca KaufmannLaura Davis, NP  propranolol (INDERAL) 10 MG tablet Take 1 tablet (10 mg total) by mouth 2 (two) times daily. 03/07/14   Fransisca KaufmannLaura Davis, NP  simvastatin (ZOCOR) 20 MG tablet Take 1 tablet (20 mg total) by mouth at bedtime. 03/07/14   Fransisca KaufmannLaura Davis, NP  traZODone (DESYREL) 150 MG tablet Take 1 tablet (150 mg total) by mouth at bedtime as needed for sleep. 03/07/14   Fransisca KaufmannLaura Davis, NP   BP 151/97 mmHg  Pulse 75  Temp(Src) 98.6 F (37 C) (Oral)  Resp 18  Ht 5\' 11"  (1.803 m)  Wt 243 lb (110.224 kg)  BMI 33.91 kg/m2  SpO2 100% Physical Exam  Constitutional: He is oriented to person, place, and time. He appears well-developed and well-nourished. No distress.  HENT:  Head:  Normocephalic and atraumatic.  Right Ear: Hearing normal.  Left Ear: Hearing normal.  Nose: Nose normal.  Mouth/Throat: Oropharynx is clear and moist and mucous membranes are normal.  Eyes: Conjunctivae and EOM are normal. Pupils are equal, round, and reactive to light.  Neck: Normal range of motion. Neck supple.  Cardiovascular: Regular rhythm, S1 normal and S2 normal.  Exam reveals no gallop and no friction rub.   No murmur heard. Pulmonary/Chest: Effort normal and breath sounds normal. No respiratory distress. He exhibits no tenderness.  Abdominal: Soft. Normal appearance and bowel sounds are normal. There is no  hepatosplenomegaly. There is no tenderness. There is no rebound, no guarding, no tenderness at McBurney's point and negative Murphy's sign. No hernia.  Musculoskeletal: Normal range of motion.  Neurological: He is alert and oriented to person, place, and time. He has normal strength. No cranial nerve deficit or sensory deficit. Coordination normal. GCS eye subscore is 4. GCS verbal subscore is 5. GCS motor subscore is 6.  Skin: Skin is warm, dry and intact. No rash noted. No cyanosis.  Psychiatric: His speech is normal. He is withdrawn. He exhibits a depressed mood. He expresses suicidal ideation.  Nursing note and vitals reviewed.   ED Course  Procedures (including critical care time) Labs Review Labs Reviewed  URINE RAPID DRUG SCREEN (HOSP PERFORMED)  ACETAMINOPHEN LEVEL  CBC  COMPREHENSIVE METABOLIC PANEL  ETHANOL  SALICYLATE LEVEL  TSH  VALPROIC ACID LEVEL    Imaging Review No results found.   EKG Interpretation None      MDM   Final diagnoses:  Depression  Suicidal ideation   Patient presents to the ER for evaluation of progressively worsening depression. Patient does have a history of depression and schizophrenia. He has been off his medications for approximately 2 weeks. Patient reports progressively worsening symptoms including thoughts of harming himself. He will require a behavioral health evaluation for further management.    Gilda Crease, MD 11/03/14 [phone]

## 2014-11-03 NOTE — ED Notes (Signed)
Pt states he recently moved back to this location and has been without medication for 2 weeks. Pt states he has stopped taking haladol, prozac, and depakote. Also stopped synthroid.  Pt states he feels like his thoughts are racing. He states, "I want to hurt myself but I don't want to hurt myself at the same time. Pt states he is feeling more depressed in the last 2 days. Denies any plan to harm himself. NAD noted at this time.

## 2014-11-03 NOTE — BH Assessment (Signed)
Requested RN, Lauren, move tele assessment cart to pt room.  Attempted to connect to complete teleassessment.  Unable to connect.  Will continue to try.

## 2014-11-03 NOTE — BH Assessment (Signed)
Writer informed TTS (Amanda) of the consult.   

## 2014-11-04 DIAGNOSIS — R45851 Suicidal ideations: Secondary | ICD-10-CM

## 2014-11-04 DIAGNOSIS — F3160 Bipolar disorder, current episode mixed, unspecified: Secondary | ICD-10-CM | POA: Diagnosis not present

## 2014-11-04 DIAGNOSIS — F329 Major depressive disorder, single episode, unspecified: Secondary | ICD-10-CM | POA: Diagnosis not present

## 2014-11-04 NOTE — Discharge Instructions (Signed)
[facility].

## 2014-11-04 NOTE — Consult Note (Signed)
Telepsych Consultation   Reason for Consult:  Suicidal Ideation Referring Physician:  EDP Patient Identification: JEREMYAH JELLEY MRN:  [id number] Principal Diagnosis: Bipolar disorder, mixed Diagnosis:   Patient Active Problem List   Diagnosis Date Noted  . Bipolar disorder, mixed [F31.60]   . Schizophrenia [F20.9] 03/01/2014  . Schizoaffective disorder, unspecified condition [F25.9] 03/01/2014  . Paranoid schizophrenia, chronic condition with acute exacerbation [F20.0] 07/05/2013  . Paroxysmal supraventricular tachycardia [I47.1] 01/01/2013  . Marijuana abuse [F12.10] 12/29/2012    Total Time spent with patient: 25 minutes  Subjective:   BRAULIO KIEDROWSKI is a 24 y.o. male patient admitted with reports of suicidal thoughts without plan. Pt has a history of Bipolar exacerbation with psychosis and 2 admissions to [facility] for same. Pt reports that he ran out of his medications 2 weeks ago and would like to restart them. He spent the night in the ED without incident; nursing and EDP notes support this as well. Pt denies SI, HI, and AVH, and contracts for safety at this time. He continues to deny ever having a plan. He would like to restart his medications as soon as possible and is very receptive to referrals to [facility]. Additionally, this NP obtained collateral from his roommate: Pilar Plate [phone]), who confirmed that he has no concerns about the pt's safety and denies any self-injurious behaviors or gestures. Mr. Elenore Paddy affirms that he will be diligent in monitoring the patient for any such signs and will contact 911 in case of emergency. He reports that there are 2 guns in the house but that they are secured in a gun safe to which the patient has no access. He denies any other weapons or dangerous items in the household.   HPI:  Patient presents to the ER for evaluation of depression and suicidal ideation. Patient reports that he has been off his medications for approximately 2  weeks. Patient reports that he has developed racing thoughts and thoughts about harming himself. He has not identified a plan, but is concerned that he will hurt himself. Symptoms have worsened over the last 1 or 2 days. He is not suicidal. Denies any other illness.  HPI Elements:   Location:  Psychiatric. Quality:  Improving, stable, baseline. Severity:  Moderate. Timing:  Intermittent. Duration:  Transient. Context:  Exacerbation of underlying mental illness secondary to abrupt cessation of psychiatric medications..  Past Medical History:  Past Medical History  Diagnosis Date  . Schizophrenia   . Hypothyroidism   . Hypercholesterolemia   . Hypertension   . Depression     Past Surgical History  Procedure Laterality Date  . Tonsillectomy    . Cardiac surgery      for Epstein's abnormality at 24yo   Family History: No family history on file. Social History:  History  Alcohol Use  . Yes    Comment: occ.     History  Drug Use  . Yes  . Special: Marijuana    Comment: occ- last used 10/31/14    History   Social History  . Marital Status: Single    Spouse Name: N/A  . Number of Children: N/A  . Years of Education: N/A   Social History Main Topics  . Smoking status: Current Every Day Smoker -- 1.00 packs/day    Types: Cigarettes  . Smokeless tobacco: Not on file  . Alcohol Use: Yes     Comment: occ.  . Drug Use: Yes    Special: Marijuana     Comment: occ-  last used 10/31/14  . Sexual Activity: No   Other Topics Concern  . None   Social History Narrative   Additional Social History:    History of alcohol / drug use?: Yes Name of Substance 1: Alcohol 1 - Age of First Use: 19 1 - Amount (size/oz): 6 pack plus a fifth of whisky 1 - Frequency: daily 1 - Duration: ongoing 1 - Last Use / Amount: 11/02/14 couple of shots Name of Substance 2: Marijuana 2 - Age of First Use: teens 2 - Amount (size/oz): a blunt and a bowl 2 - Frequency: every now and then 2 -  Duration: ongoing 2 - Last Use / Amount: 10/29/14                 Allergies:  No Known Allergies  Vitals: Blood pressure 118/67, pulse 71, temperature 98.3 F (36.8 C), temperature source Oral, resp. rate 20, height 5\' 11"  (1.803 m), weight 110.224 kg (243 lb), SpO2 99 %.  Risk to Self: Suicidal Ideation: Yes-Currently Present Suicidal Intent: No Is patient at risk for suicide?: No Suicidal Plan?: No Access to Means: No What has been your use of drugs/alcohol within the last 12 months?: smokes marijuana How many times?: 1 (age 24 OD on clonidine) Triggers for Past Attempts: Unknown Intentional Self Injurious Behavior: Cutting Comment - Self Injurious Behavior: last cut at age 24 Risk to Others: Homicidal Ideation: No Thoughts of Harm to Others: No Current Homicidal Intent: No Current Homicidal Plan: No Access to Homicidal Means: No History of harm to others?: No Assessment of Violence: None Noted Does patient have access to weapons?: No Criminal Charges Pending?: No Does patient have a court date: No Prior Inpatient Therapy: Prior Inpatient Therapy: Yes Prior Therapy Dates: since age 24, most recent [id number] Prior Therapy Facilty/Provider(s): [facility] Reason for Treatment: SI,  Prior Outpatient Therapy: Prior Outpatient Therapy: Yes Prior Therapy Dates: until January 2016 Prior Therapy Facilty/Provider(s): [facility] Reason for Treatment: depression, bipolar disorder  Current Facility-Administered Medications  Medication Dose Route Frequency Provider Last Rate Last Dose  . acetaminophen (TYLENOL) tablet 650 mg  650 mg Oral Q4H PRN Gilda Creasehristopher J. Pollina, MD      . alum & mag hydroxide-simeth (MAALOX/MYLANTA) 200-200-20 MG/5ML suspension 30 mL  30 mL Oral PRN Gilda Creasehristopher J. Pollina, MD      . benztropine (COGENTIN) tablet 1 mg  1 mg Oral BID Gilda Creasehristopher J. Pollina, MD   1 mg at 11/04/14 0936  . busPIRone (BUSPAR) tablet 15 mg  15 mg  Oral TID Gilda Creasehristopher J. Pollina, MD   15 mg at 11/04/14 0936  . divalproex (DEPAKOTE) DR tablet 2,000 mg  2,000 mg Oral QHS Gilda Creasehristopher J. Pollina, MD   2,000 mg at 11/03/14 2114  . FLUoxetine (PROZAC) capsule 60 mg  60 mg Oral q morning - 10a Gilda Creasehristopher J. Pollina, MD   60 mg at 11/04/14 0936  . haloperidol (HALDOL) tablet 10 mg  10 mg Oral BID Gilda Creasehristopher J. Pollina, MD   10 mg at 11/04/14 0936  . ibuprofen (ADVIL,MOTRIN) tablet 600 mg  600 mg Oral Q8H PRN Gilda Creasehristopher J. Pollina, MD      . levothyroxine (SYNTHROID, LEVOTHROID) tablet 100 mcg  100 mcg Oral Daily Gilda Creasehristopher J. Pollina, MD   100 mcg at 11/04/14 [id number]  . LORazepam (ATIVAN) tablet 1 mg  1 mg Oral Q6H PRN Gilda Creasehristopher J. Pollina, MD      . nicotine (NICODERM CQ - dosed in mg/24 hours) patch  21 mg  21 mg Transdermal Once Gilda Crease, MD   21 mg at 11/03/14 1603  . ondansetron (ZOFRAN) tablet 4 mg  4 mg Oral Q8H PRN Gilda Crease, MD      . propranolol (INDERAL) tablet 10 mg  10 mg Oral BID Gilda Crease, MD   10 mg at 11/04/14 0936  . simvastatin (ZOCOR) tablet 20 mg  20 mg Oral QHS Gilda Crease, MD   20 mg at 11/03/14 2114  . traZODone (DESYREL) tablet 150 mg  150 mg Oral QHS PRN Gilda Crease, MD       Current Outpatient Prescriptions  Medication Sig Dispense Refill  . benztropine (COGENTIN) 1 MG tablet Take 1 tablet (1 mg total) by mouth 2 (two) times daily. 60 tablet 0  . busPIRone (BUSPAR) 15 MG tablet Take 1 tablet (15 mg total) by mouth 3 (three) times daily. 90 tablet 0  . divalproex (DEPAKOTE) 500 MG DR tablet Take 4 tablets (2,000 mg total) by mouth at bedtime. 120 tablet 0  . fenofibrate 160 MG tablet Take 1 tablet (160 mg total) by mouth every morning.    Marland Kitchen FLUoxetine (PROZAC) 20 MG capsule Take 3 capsules (60 mg total) by mouth every morning. 30 capsule 0  . haloperidol (HALDOL) 10 MG tablet Take 1 tablet (10 mg total) by mouth 2 (two) times daily. 60 tablet 0  .  haloperidol decanoate (HALDOL DECANOATE) 100 MG/ML injection Inject 1 mL (100 mg total) into the muscle every 28 (twenty-eight) days. Last received 02/28/14 1 mL 2  . levothyroxine (SYNTHROID, LEVOTHROID) 100 MCG tablet Take 1 tablet (100 mcg total) by mouth daily.    . propranolol (INDERAL) 10 MG tablet Take 1 tablet (10 mg total) by mouth 2 (two) times daily. 60 tablet 0  . simvastatin (ZOCOR) 20 MG tablet Take 1 tablet (20 mg total) by mouth at bedtime. 30 tablet   . traZODone (DESYREL) 150 MG tablet Take 1 tablet (150 mg total) by mouth at bedtime as needed for sleep. 30 tablet 0    Musculoskeletal: Strength & Muscle Tone: UTO, camera Gait & Station: UTO, camera Patient leans: UTO, camera   Psychiatric Specialty Exam:     Blood pressure 118/67, pulse 71, temperature 98.3 F (36.8 C), temperature source Oral, resp. rate 20, height  (1.803 m), weight 110.224 kg (243 lb), SpO2 99 %.Body mass index is 33.91 kg/(m^2).  General Appearance: Casual and Fairly Groomed  Patent attorney::  Good  Speech:  Clear and Coherent and Normal Rate  Volume:  Normal  Mood:  Euthymic  Affect:  Appropriate and Congruent  Thought Process:  Coherent and Goal Directed  Orientation:  Full (Time, Place, and Person)  Thought Content:  WDL  Suicidal Thoughts:  No  Homicidal Thoughts:  No  Memory:  Immediate;   Fair Recent;   Fair Remote;   Fair  Judgement:  Fair  Insight:  Good  Psychomotor Activity:  Normal  Concentration:  Good  Recall:  Good  Fund of Knowledge:Fair  Language: Good  Akathisia:  No  Handed:    AIMS (if indicated):     Assets:  Communication Skills Desire for Improvement Financial Resources/Insurance Housing Physical Health Resilience Social Support Transportation  ADL's:  Intact  Cognition: WNL  Sleep:      Medical Decision Making: Established Problem, Stable/Improving (1), Review of Psycho-Social Stressors (1), Review or order clinical lab tests (1) and Review and  summation of old records (2)  Treatment Plan Summary: See below  Plan:  No evidence of imminent risk to self or others at present.   Patient does not meet criteria for psychiatric inpatient admission. Supportive therapy provided about ongoing stressors. Refer to IOP. Discussed crisis plan, support from social network, calling 911, coming to the Emergency Department, and calling Suicide Hotline.  Disposition:  -Discharge home -Followup with [facility] TTS to assist)  Beau Fanny, FNP-BC 11/04/2014 10:55AM

## 2014-11-04 NOTE — ED Notes (Signed)
Pt admits to still having suicidal thoughts with no plan.  During admission to [facility] ER pt has not conveyed any suicidal behaviors.  Pt cooperative.

## 2014-11-13 ENCOUNTER — Encounter (HOSPITAL_COMMUNITY): Payer: Self-pay | Admitting: Emergency Medicine

## 2014-11-13 ENCOUNTER — Emergency Department (HOSPITAL_COMMUNITY)
Admission: EM | Admit: 2014-11-13 | Discharge: 2014-11-14 | Disposition: A | Discharge status: Other Acute Inpatient Hospital | Payer: Federal, State, Local not specified - PPO | Attending: Emergency Medicine | Admitting: Emergency Medicine

## 2014-11-13 DIAGNOSIS — I10 Essential (primary) hypertension: Secondary | ICD-10-CM | POA: Insufficient documentation

## 2014-11-13 DIAGNOSIS — R45851 Suicidal ideations: Secondary | ICD-10-CM | POA: Insufficient documentation

## 2014-11-13 DIAGNOSIS — Z720 Tobacco use: Secondary | ICD-10-CM | POA: Insufficient documentation

## 2014-11-13 DIAGNOSIS — E039 Hypothyroidism, unspecified: Secondary | ICD-10-CM | POA: Insufficient documentation

## 2014-11-13 DIAGNOSIS — Z8659 Personal history of other mental and behavioral disorders: Secondary | ICD-10-CM | POA: Diagnosis not present

## 2014-11-13 DIAGNOSIS — E780 Pure hypercholesterolemia: Secondary | ICD-10-CM | POA: Diagnosis not present

## 2014-11-13 DIAGNOSIS — F3160 Bipolar disorder, current episode mixed, unspecified: Secondary | ICD-10-CM | POA: Diagnosis not present

## 2014-11-13 DIAGNOSIS — Z046 Encounter for general psychiatric examination, requested by authority: Secondary | ICD-10-CM | POA: Diagnosis present

## 2014-11-13 DIAGNOSIS — Z79899 Other long term (current) drug therapy: Secondary | ICD-10-CM | POA: Insufficient documentation

## 2014-11-13 LAB — RAPID URINE DRUG SCREEN, HOSP PERFORMED
Amphetamines: NOT DETECTED
Barbiturates: NOT DETECTED
Benzodiazepines: NOT DETECTED
Cocaine: NOT DETECTED
Opiates: NOT DETECTED
Tetrahydrocannabinol: NOT DETECTED

## 2014-11-13 LAB — CBC WITH DIFFERENTIAL/PLATELET
Basophils Absolute: 0.1 10*3/uL (ref 0.0–0.1)
Basophils Relative: 1 % (ref 0–1)
Eosinophils Absolute: 0.2 10*3/uL (ref 0.0–0.7)
Eosinophils Relative: 3 % (ref 0–5)
HCT: 45.2 % (ref 39.0–52.0)
Hemoglobin: 15.4 g/dL (ref 13.0–17.0)
Lymphocytes Relative: 34 % (ref 12–46)
Lymphs Abs: 2.9 10*3/uL (ref 0.7–4.0)
MCH: 31.4 pg (ref 26.0–34.0)
MCHC: 34.1 g/dL (ref 30.0–36.0)
MCV: 92.2 fL (ref 78.0–100.0)
Monocytes Absolute: 0.7 10*3/uL (ref 0.1–1.0)
Monocytes Relative: 9 % (ref 3–12)
Neutro Abs: 4.7 10*3/uL (ref 1.7–7.7)
Neutrophils Relative %: 53 % (ref 43–77)
Platelets: 206 10*3/uL (ref 150–400)
RBC: 4.9 MIL/uL (ref 4.22–5.81)
RDW: 13.3 % (ref 11.5–15.5)
WBC: 8.6 10*3/uL (ref 4.0–10.5)

## 2014-11-13 LAB — BASIC METABOLIC PANEL
Anion gap: 1 — ABNORMAL LOW (ref 5–15)
BUN: 10 mg/dL (ref 6–23)
CO2: 29 mmol/L (ref 19–32)
Calcium: 9.1 mg/dL (ref 8.4–10.5)
Chloride: 109 mmol/L (ref 96–112)
Creatinine, Ser: 0.82 mg/dL (ref 0.50–1.35)
GFR calc Af Amer: >90 mL/min (ref >90)
GFR calc non Af Amer: >90 mL/min (ref >90)
Glucose, Bld: 104 mg/dL — ABNORMAL HIGH (ref 70–99)
Potassium: 4 mmol/L (ref 3.5–5.1)
Sodium: 139 mmol/L (ref 135–145)

## 2014-11-13 LAB — ETHANOL: Alcohol, Ethyl (B): <5 mg/dL (ref 0–9)

## 2014-11-13 MED ORDER — PROPRANOLOL HCL 10 MG PO TABS: 10.0000 mg | ORAL_TABLET | Freq: Two times a day (BID) | ORAL | Status: DC

## 2014-11-13 MED ORDER — FLUOXETINE HCL 20 MG PO CAPS
60.0000 mg | ORAL_CAPSULE | Freq: Every morning | ORAL | Status: DC
  Administered 2014-11-14: 60 mg via ORAL
  Filled 2014-11-13 (×2): qty 3

## 2014-11-13 MED ORDER — BUSPIRONE HCL 15 MG PO TABS
15.0000 mg | ORAL_TABLET | Freq: Three times a day (TID) | ORAL | Status: DC
  Administered 2014-11-13 – 2014-11-14 (×2): 15 mg via ORAL
  Filled 2014-11-13 (×5): qty 3

## 2014-11-13 MED ORDER — FENOFIBRATE 160 MG PO TABS
160.0000 mg | ORAL_TABLET | Freq: Every morning | ORAL | Status: DC
  Administered 2014-11-14: 160 mg via ORAL
  Filled 2014-11-13 (×2): qty 1

## 2014-11-13 MED ORDER — LEVOTHYROXINE SODIUM 50 MCG PO TABS
100.0000 ug | ORAL_TABLET | Freq: Every day | ORAL | Status: DC
  Administered 2014-11-13 – 2014-11-14 (×2): 100 ug via ORAL
  Filled 2014-11-13 (×2): qty 2

## 2014-11-13 MED ORDER — DIVALPROEX SODIUM 250 MG PO DR TAB
2000.0000 mg | DELAYED_RELEASE_TABLET | Freq: Every day | ORAL | Status: DC
  Administered 2014-11-13: 2000 mg via ORAL
  Filled 2014-11-13: qty 8

## 2014-11-13 MED ORDER — BENZTROPINE MESYLATE 1 MG PO TABS
1.0000 mg | ORAL_TABLET | Freq: Two times a day (BID) | ORAL | Status: DC
  Administered 2014-11-13 – 2014-11-14 (×2): 1 mg via ORAL
  Filled 2014-11-13 (×2): qty 1

## 2014-11-13 MED ORDER — TRAZODONE HCL 50 MG PO TABS
150.0000 mg | ORAL_TABLET | Freq: Every evening | ORAL | Status: DC | PRN
  Administered 2014-11-13: 150 mg via ORAL
  Filled 2014-11-13: qty 3

## 2014-11-13 NOTE — ED Provider Notes (Signed)
CSN: [id number]     Arrival date & time 11/13/14  4098 History  This chart was scribed for Richard Razor, MD by Evon Slack, ED Scribe. This patient was seen in room APA15/APA15 and the patient's care was started at 7:11 PM.     Chief Complaint  Patient presents with  . V70.1   The history is provided by the patient. No language interpreter was used.   HPI Comments: DEVANSH Gonzales is a 24 y.o. male with PMHx of schizophrenia and bipolar disorder who presents to the Emergency Department complaining of SI onset today. Pt states that he called 911 and told them he planned to run out in front of traffic. Pt sates that he has not been compliant with taking his medications for the past 3-4 days due to running out. Pt states that he has run out of his Depakote, Prozac, Synthroid and trazodone. Pt states that he is currently homeless. Pt states that he thinks his anxiety triggered these thought today. Pt denies HI or hallucinations.  Denies recent drug or alcohol use.   Past Medical History  Diagnosis Date  . Schizophrenia   . Hypothyroidism   . Hypercholesterolemia   . Hypertension   . Depression    Past Surgical History  Procedure Laterality Date  . Tonsillectomy    . Cardiac surgery      for Epstein's abnormality at 24yo   No family history on file. History  Substance Use Topics  . Smoking status: Current Every Day Smoker -- 1.00 packs/day    Types: Cigarettes  . Smokeless tobacco: Not on file  . Alcohol Use: No     Comment: occ.    Review of Systems  Psychiatric/Behavioral: Positive for suicidal ideas. Negative for hallucinations. The patient is nervous/anxious.   All other systems reviewed and are negative.     Allergies  Review of patient's allergies indicates no known allergies.  Home Medications   Prior to Admission medications   Medication Sig Start Date End Date Taking? Authorizing Provider  benztropine (COGENTIN) 1 MG tablet Take 1 tablet (1 mg total)  by mouth 2 (two) times daily. 03/07/14   Fransisca Kaufmann, NP  busPIRone (BUSPAR) 15 MG tablet Take 1 tablet (15 mg total) by mouth 3 (three) times daily. 03/07/14   Fransisca Kaufmann, NP  divalproex (DEPAKOTE) 500 MG DR tablet Take 4 tablets (2,000 mg total) by mouth at bedtime. 03/07/14   Fransisca Kaufmann, NP  fenofibrate 160 MG tablet Take 1 tablet (160 mg total) by mouth every morning. 03/07/14   Fransisca Kaufmann, NP  FLUoxetine (PROZAC) 20 MG capsule Take 3 capsules (60 mg total) by mouth every morning. 03/07/14   Fransisca Kaufmann, NP  haloperidol (HALDOL) 10 MG tablet Take 1 tablet (10 mg total) by mouth 2 (two) times daily. 03/07/14   Fransisca Kaufmann, NP  haloperidol decanoate (HALDOL DECANOATE) 100 MG/ML injection Inject 1 mL (100 mg total) into the muscle every 28 (twenty-eight) days. Last received 02/28/14 03/07/14   Fransisca Kaufmann, NP  levothyroxine (SYNTHROID, LEVOTHROID) 100 MCG tablet Take 1 tablet (100 mcg total) by mouth daily. 03/07/14   Fransisca Kaufmann, NP  propranolol (INDERAL) 10 MG tablet Take 1 tablet (10 mg total) by mouth 2 (two) times daily. 03/07/14   Fransisca Kaufmann, NP  simvastatin (ZOCOR) 20 MG tablet Take 1 tablet (20 mg total) by mouth at bedtime. 03/07/14   Fransisca Kaufmann, NP  traZODone (DESYREL) 150 MG tablet Take 1 tablet (150 mg total) by mouth at  bedtime as needed for sleep. 03/07/14   Fransisca KaufmannLaura Davis, NP   BP 102/58 mmHg  Pulse 88  Temp(Src) 98.5 F (36.9 C) (Oral)  Resp 14  Ht 5\' 10"  (1.778 m)  Wt 243 lb (110.224 kg)  BMI 34.87 kg/m2  SpO2 99%   Physical Exam  Constitutional: He is oriented to person, place, and time. He appears well-developed and well-nourished.  HENT:  Head: Normocephalic and atraumatic.  Eyes: Conjunctivae and EOM are normal. Pupils are equal, round, and reactive to light.  Neck: Normal range of motion. Neck supple.  Cardiovascular: Normal rate and regular rhythm.   Pulmonary/Chest: Effort normal and breath sounds normal.  Abdominal: Soft. Bowel sounds are normal.  Musculoskeletal:  Normal range of motion.  Neurological: He is alert and oriented to person, place, and time.  Skin: Skin is warm and dry.  Psychiatric: He has a normal mood and affect. His behavior is normal.  Nursing note and vitals reviewed.   ED Course  Procedures (including critical care time) DIAGNOSTIC STUDIES: Oxygen Saturation is 99% on RA, normal by my interpretation.    COORDINATION OF CARE: 7:24 PM-Discussed treatment plan with pt at bedside and pt agreed to plan.     Labs Review Labs Reviewed  BASIC METABOLIC PANEL - Abnormal; Notable for the following:    Glucose, Bld 104 (*)    Anion gap 1 (*)    All other components within normal limits  CBC WITH DIFFERENTIAL/PLATELET  ETHANOL  URINE RAPID DRUG SCREEN (HOSP PERFORMED)    Imaging Review No results found.   EKG Interpretation None      MDM   Final diagnoses:  Suicidal ideation    24 year old male with suicidal ideation. MEDICATIONS. Plan to run into oncoming traffic. Patient is currently calm and cooperative. He states it is currently homeless. This may be contributing to why he presented today. Regardless, we'll medically clear. TTS evaluation.   I personally preformed the services scribed in my presence. The recorded information has been reviewed is accurate. Richard RazorStephen Reice Bienvenue, MD.      Richard RazorStephen Arohi Salvatierra, MD 11/19/14 [phone]

## 2014-11-13 NOTE — ED Notes (Signed)
Security wanded pt in triage, Officer with pt

## 2014-11-13 NOTE — BH Assessment (Signed)
Tele Assessment Note   [name] is a 24 y.o. male who voluntarily presents to [facility] with SI thoughts and Depression.  Pt reports the following: pt says SI thoughts began today with a plan to "run into traffic".  Pt states he's off his medications for 2 days because he cannot afford it and currently homeless for 3 days after leaving his group home([facility]) in [place]--"got tired of living there'.  Pt states he planned on moving in a home with 2 other friends but it didn't work out.  He is residing in a shelter.  Pt admits 1 previous SI attempt at 24 yrs old by overdosing on clonidine.  Pt says he smokes 1 marijuana cigarette, every other day and his last use was 2 wks ago. Pt denies current outpatient services, however he told this Clinical research associatewriter that he is meeting with [facility] on 11/14/14 to help him with obtaining his medications and also has an appt with [facility] to help with housing.  Pt says he cannot contract for safety.     Axis I: Schizoaffective Disorder and Cannabis use disorder, Mild Axis II: Deferred Axis III:  Past Medical History  Diagnosis Date  . Schizophrenia   . Hypothyroidism   . Hypercholesterolemia   . Hypertension   . Depression    Axis IV: other psychosocial or environmental problems, problems related to social environment and problems with primary support group Axis V: 31-40 impairment in reality testing  Past Medical History:  Past Medical History  Diagnosis Date  . Schizophrenia   . Hypothyroidism   . Hypercholesterolemia   . Hypertension   . Depression     Past Surgical History  Procedure Laterality Date  . Tonsillectomy    . Cardiac surgery      for Epstein's abnormality at 24yo    Family History: No family history on file.  Social History:  reports that he has been smoking Cigarettes.  He has been smoking about 1.00 pack per day. He does not have any smokeless tobacco history on file. He reports that he uses illicit drugs (Marijuana). He reports that  he does not drink alcohol.  Additional Social History:  Alcohol / Drug Use Pain Medications: See MAR  Prescriptions: See MAR  Over the Counter: See MAR  History of alcohol / drug use?: Yes Longest period of sobriety (when/how long): None  Negative Consequences of Use: Work / Programmer, multimediachool, Copywriter, advertisingersonal relationships, Surveyor, quantityinancial Withdrawal Symptoms: Other (Comment) (no current w/d sxs ) Substance #1 Name of Substance 1: THC  1 - Age of First Use: Teens  1 - Amount (size/oz): 1 Blunt  1 - Frequency: "Every other day"  1 - Duration: On-going  1 - Last Use / Amount: 2 wks ago   CIWA: CIWA-Ar BP: 102/58 mmHg Pulse Rate: 88 COWS:    PATIENT STRENGTHS: (choose at least two) Communication skills Motivation for treatment/growth  Allergies: No Known Allergies  Home Medications:  (Not in a hospital admission)  OB/GYN Status:  No LMP for male patient.  General Assessment Data Location of Assessment: [facility] Is this a Tele or Face-to-Face Assessment?: Tele Assessment Is this an Initial Assessment or a Re-assessment for this encounter?: Initial Assessment Living Arrangements: Other (Comment) (Pt lives in a shelter ) Can pt return to current living arrangement?: Yes Admission Status: Voluntary Is patient capable of signing voluntary admission?: Yes Transfer from: Home Referral Source: Self/Family/Friend  Medical Screening Exam [facility] Walk[facility] ONLY) Medical Exam completed: No Reason for MSE not completed: Other: (  None reported )  [facility] Crisis Care Plan Living Arrangements: Other (Comment) (Pt lives in a shelter ) Name of Psychiatrist: None  Name of Therapist: None   Education Status Is patient currently in school?: No Current Grade: None  Highest grade of school patient has completed: 8 Name of school: None  Contact person: None   Risk to self with the past 6 months Suicidal Ideation: Yes-Currently Present Suicidal Intent: Yes-Currently Present Is patient at risk for suicide?:  Yes Suicidal Plan?: Yes-Currently Present Specify Current Suicidal Plan: Run in traffic  Access to Means: Yes Specify Access to Suicidal Means: Traffic, vehicles  Previous Attempts/Gestures: Yes How many times?: 1 Other Self Harm Risks: None  Triggers for Past Attempts: Unpredictable Intentional Self Injurious Behavior: None Comment - Self Injurious Behavior: None  Family Suicide History: No Recent stressful life event(s): Financial Problems, Other (Comment) (Off meds x2 days; homelessness ) Persecutory voices/beliefs?: No Depression: Yes Depression Symptoms: Loss of interest in usual pleasures, Feeling worthless/self pity, Insomnia Substance abuse history and/or treatment for substance abuse?: Yes Suicide prevention information given to non-admitted patients: Not applicable  Risk to Others within the past 6 months Homicidal Ideation: No Thoughts of Harm to Others: No Current Homicidal Intent: No Current Homicidal Plan: No Access to Homicidal Means: No Identified Victim: None  History of harm to others?: No Assessment of Violence: None Noted Violent Behavior Description: None  Does patient have access to weapons?: No Criminal Charges Pending?: No Does patient have a court date: No  Psychosis Hallucinations: None noted Delusions: None noted  Mental Status Report Appear/Hygiene: Disheveled, In scrubs Eye Contact: Fair Motor Activity: Unremarkable Speech: Logical/coherent Level of Consciousness: Alert, Quiet/awake Mood: Depressed Affect: Depressed, Appropriate to circumstance, Flat Anxiety Level: None Thought Processes: Coherent, Relevant Judgement: Impaired Orientation: Person, Place, Time, Situation Obsessive Compulsive Thoughts/Behaviors: None  Cognitive Functioning Concentration: Decreased Memory: Recent Intact, Remote Intact IQ: Average Insight: Poor Impulse Control: Poor Appetite: Fair Weight Loss: 0 Weight Gain: 0 Sleep: Decreased Total Hours of Sleep:  6 Vegetative Symptoms: None  ADLScreening [facility] Assessment Services) Patient's cognitive ability adequate to safely complete daily activities?: Yes Patient able to express need for assistance with ADLs?: Yes Independently performs ADLs?: Yes (appropriate for developmental age)  Prior Inpatient Therapy Prior Inpatient Therapy: Yes Prior Therapy Dates: since age 47, most recent 55 Prior Therapy Facilty/Provider(s): [facility], [facility], [facility], [facility]  Reason for Treatment: SI/depression   Prior Outpatient Therapy Prior Outpatient Therapy: Yes Prior Therapy Dates: until January 2016 Prior Therapy Facilty/Provider(s): Ambulance person Reason for Treatment: depression, bipolar disorder  ADL Screening (condition at time of admission) Patient's cognitive ability adequate to safely complete daily activities?: Yes Is the patient deaf or have difficulty hearing?: No Does the patient have difficulty seeing, even when wearing glasses/contacts?: No Does the patient have difficulty concentrating, remembering, or making decisions?: Yes Patient able to express need for assistance with ADLs?: Yes Does the patient have difficulty dressing or bathing?: No Independently performs ADLs?: Yes (appropriate for developmental age) Does the patient have difficulty walking or climbing stairs?: No Weakness of Legs: None Weakness of Arms/Hands: None  Home Assistive Devices/Equipment Home Assistive Devices/Equipment: None  Therapy Consults (therapy consults require a physician order) PT Evaluation Needed: No OT Evalulation Needed: No SLP Evaluation Needed: No Abuse/Neglect Assessment (Assessment to be complete while patient is alone) Physical Abuse: Denies Verbal Abuse: Denies Sexual Abuse: Denies Exploitation of patient/patient's resources: Denies Self-Neglect: Denies Values / Beliefs Cultural Requests During Hospitalization: None Spiritual  Requests During Hospitalization:  None Consults Spiritual Care Consult Needed: No Social Work Consult Needed: No Merchant navy officer (For Healthcare) Does patient have an advance directive?: No Would patient like information on creating an advanced directive?: No - patient declined information    Additional Information 1:1 In Past 12 Months?: No CIRT Risk: No Elopement Risk: No Does patient have medical clearance?: Yes     Disposition:  Disposition Initial Assessment Completed for this Encounter: Yes Disposition of Patient: Inpatient treatment program, Referred to [facility] ) Type of inpatient treatment program: Adult Patient referred to: Other (Comment) ([facility] )  [name] 11/13/2014 8:30 PM

## 2014-11-13 NOTE — ED Notes (Signed)
In  [employer]: One sony walkman One Maldiveskyocera phone One att flip phone One Consulting civil engineercharger One wallet with 28cents in change, one ssn card, 2 insurance cards and one photo ID clothing

## 2014-11-13 NOTE — Progress Notes (Signed)
CSW faxed patient referral to the following inpatient facilities with bed openings in am: [facility], [address] [place], [facility], [place], [address], [address], [place], and [facility].  Will continue to seek placement.  [place] Abtsatia Airon Sahni, LCSWA Disposition staff 11/13/2014 10:45 PM

## 2014-11-13 NOTE — ED Notes (Signed)
Pt has not been taking medication for 2 days, pt called police to say " I am going to run out in front of traffic and kill myself" pt lives in shelter

## 2014-11-14 ENCOUNTER — Encounter (HOSPITAL_COMMUNITY): Payer: Self-pay | Admitting: *Deleted

## 2014-11-14 ENCOUNTER — Encounter (HOSPITAL_COMMUNITY): Payer: Self-pay

## 2014-11-14 ENCOUNTER — Inpatient Hospital Stay (HOSPITAL_COMMUNITY)
Admission: AD | Admit: 2014-11-14 | Discharge: 2014-11-18 | DRG: 885 | Disposition: A | Discharge status: Home / Self Care | Payer: Federal, State, Local not specified - PPO | Source: Intra-hospital | Attending: Psychiatry | Admitting: Psychiatry

## 2014-11-14 DIAGNOSIS — F17210 Nicotine dependence, cigarettes, uncomplicated: Secondary | ICD-10-CM | POA: Diagnosis present

## 2014-11-14 DIAGNOSIS — R45851 Suicidal ideations: Secondary | ICD-10-CM | POA: Diagnosis present

## 2014-11-14 DIAGNOSIS — F209 Schizophrenia, unspecified: Secondary | ICD-10-CM | POA: Diagnosis present

## 2014-11-14 DIAGNOSIS — E780 Pure hypercholesterolemia: Secondary | ICD-10-CM | POA: Diagnosis present

## 2014-11-14 DIAGNOSIS — E039 Hypothyroidism, unspecified: Secondary | ICD-10-CM | POA: Diagnosis present

## 2014-11-14 DIAGNOSIS — F1220 Cannabis dependence, uncomplicated: Secondary | ICD-10-CM | POA: Diagnosis present

## 2014-11-14 DIAGNOSIS — Z9114 Patient's other noncompliance with medication regimen: Secondary | ICD-10-CM | POA: Diagnosis present

## 2014-11-14 DIAGNOSIS — F200 Paranoid schizophrenia: Secondary | ICD-10-CM

## 2014-11-14 DIAGNOSIS — F3160 Bipolar disorder, current episode mixed, unspecified: Secondary | ICD-10-CM

## 2014-11-14 DIAGNOSIS — I10 Essential (primary) hypertension: Secondary | ICD-10-CM | POA: Diagnosis present

## 2014-11-14 DIAGNOSIS — F22 Delusional disorders: Secondary | ICD-10-CM | POA: Diagnosis present

## 2014-11-14 DIAGNOSIS — F515 Nightmare disorder: Secondary | ICD-10-CM | POA: Diagnosis present

## 2014-11-14 DIAGNOSIS — G4700 Insomnia, unspecified: Secondary | ICD-10-CM | POA: Diagnosis present

## 2014-11-14 MED ORDER — OLANZAPINE 5 MG PO TBDP
5.0000 mg | ORAL_TABLET | Freq: Three times a day (TID) | ORAL | Status: DC | PRN
  Administered 2014-11-14 – 2014-11-15 (×2): 5 mg via ORAL
  Filled 2014-11-14: qty 1

## 2014-11-14 MED ORDER — TRAZODONE HCL 50 MG PO TABS
50.0000 mg | ORAL_TABLET | Freq: Every evening | ORAL | Status: DC | PRN
  Filled 2014-11-14: qty 3
  Filled 2014-11-14: qty 1

## 2014-11-14 MED ORDER — ACETAMINOPHEN 325 MG PO TABS
650.0000 mg | ORAL_TABLET | Freq: Four times a day (QID) | ORAL | Status: DC | PRN
  Administered 2014-11-15 – 2014-11-16 (×3): 650 mg via ORAL
  Filled 2014-11-14 (×3): qty 2

## 2014-11-14 MED ORDER — HYDROXYZINE HCL 25 MG PO TABS
25.0000 mg | ORAL_TABLET | Freq: Four times a day (QID) | ORAL | Status: DC | PRN
  Administered 2014-11-15: 25 mg via ORAL
  Filled 2014-11-14: qty 1

## 2014-11-14 MED ORDER — ALUM & MAG HYDROXIDE-SIMETH 200-200-20 MG/5ML PO SUSP
30.0000 mL | ORAL | Status: DC | PRN
  Administered 2014-11-17: 30 mL via ORAL
  Filled 2014-11-14: qty 30

## 2014-11-14 MED ORDER — ENSURE COMPLETE PO LIQD
237.0000 mL | Freq: Two times a day (BID) | ORAL | Status: DC
  Administered 2014-11-15 – 2014-11-18 (×8): 237 mL via ORAL

## 2014-11-14 MED ORDER — MAGNESIUM HYDROXIDE 400 MG/5ML PO SUSP
30.0000 mL | Freq: Every day | ORAL | Status: DC | PRN
  Administered 2014-11-17: 30 mL via ORAL
  Filled 2014-11-14: qty 30

## 2014-11-14 NOTE — Consult Note (Signed)
Telepsych Consultation   Reason for Consult:  Suicidal Ideation Referring Physician:  EDP Patient Identification: [name] MRN:  [id number] Principal Diagnosis: Suicidal ideation Diagnosis:   Patient Active Problem List   Diagnosis Date Noted  . Bipolar disorder, mixed [F31.60]     Priority: High  . Suicidal ideation [R45.851]   . Schizophrenia [F20.9] 03/01/2014  . Schizoaffective disorder, unspecified condition [F25.9] 03/01/2014  . Paranoid schizophrenia, chronic condition with acute exacerbation [F20.0] 07/05/2013  . Paroxysmal supraventricular tachycardia [I47.1] 01/01/2013  . Marijuana abuse [F12.10] 12/29/2012    Total Time spent with patient: 25 minutes   Subjective:   [name] is a 24 y.o. male patient admitted with reports of suicidal thoughts with a plan to run into traffic. Today, pt reports that he does not have a specific plan but that he is feeling "very depressed". Pt denies HI and AVH but5 does affirm that he has thoughts of committing suicide. This NP spoke to his father, [name], who was very concerned about the patient's status. He reports that "he won't tell you everything but I'll tell you he's had a terrible time the past few weeks with family and friend situations and his account going $800 in the hole. He was sent out of the group home he was in as well so he really needs help". Pt's father reports that he feels that the pt has a strong potential to harm himself and that he would benefit from a stable environment. He also reports that he pt has been noncompliant with meds due to spending all his money.    HPI: [name] is a 24 y.o. male who voluntarily presents to [facility] with SI thoughts and Depression. Pt reports the following: pt says SI thoughts began today with a plan to "run into traffic". Pt states he's off his medications for 2 days because he cannot afford it and currently homeless for 3 days after leaving his  group home([facility]) in [place]--"got tired of living there'. Pt states he planned on moving in a home with 2 other friends but it didn't work out. He is residing in a shelter. Pt admits 1 previous SI attempt at 24 yrs old by overdosing on clonidine. Pt says he smokes 1 marijuana cigarette, every other day and his last use was 2 wks ago. Pt denies current outpatient services, however he told this Clinical research associate that he is meeting with [facility] on 11/14/14 to help him with obtaining his medications and also has an appt with [facility] to help with housing. Pt says he cannot contract for safety.   HPI Elements:   Location:  Psychiatric. Quality:  Worsening Severity:  Severe Timing:  Intermittent. Duration:  Transient. Context:  Exacerbation of underlying mental illness secondary to abrupt cessation of psychiatric medications and financial stressors.   Past Medical History:  Past Medical History  Diagnosis Date  . Schizophrenia   . Hypothyroidism   . Hypercholesterolemia   . Hypertension   . Depression     Past Surgical History  Procedure Laterality Date  . Tonsillectomy    . Cardiac surgery      for Epstein's abnormality at 24yo   Family History: No family history on file. Social History:  History  Alcohol Use No    Comment: occ.     History  Drug Use  . Yes  . Special: Marijuana    Comment: occ- last used 10/31/14    History   Social History  . Marital Status: Single  Spouse Name: N/A  . Number of Children: N/A  . Years of Education: N/A   Social History Main Topics  . Smoking status: Current Every Day Smoker -- 1.00 packs/day    Types: Cigarettes  . Smokeless tobacco: Not on file  . Alcohol Use: No     Comment: occ.  . Drug Use: Yes    Special: Marijuana     Comment: occ- last used 10/31/14  . Sexual Activity: No   Other Topics Concern  . None   Social History Narrative   Additional Social History:    Pain Medications: See MAR  Prescriptions: See MAR  Over the  Counter: See MAR  History of alcohol / drug use?: Yes Longest period of sobriety (when/how long): None  Negative Consequences of Use: Work / Programmer, multimedia, Copywriter, advertising relationships, Surveyor, quantity Withdrawal Symptoms: Other (Comment) (no current w/d sxs ) Name of Substance 1: THC  1 - Age of First Use: Teens  1 - Amount (size/oz): 1 Blunt  1 - Frequency: "Every other day"  1 - Duration: On-going  1 - Last Use / Amount: 2 wks ago                    Allergies:  No Known Allergies  Vitals: Blood pressure 102/58, pulse 88, temperature 98.5 F (36.9 C), temperature source Oral, resp. rate 14, height  (1.778 m), weight 110.224 kg (243 lb), SpO2 99 %.  Risk to Self: Suicidal Ideation: Yes-Currently Present Suicidal Intent: Yes-Currently Present Is patient at risk for suicide?: Yes Suicidal Plan?: Yes-Currently Present Specify Current Suicidal Plan: Run in traffic  Access to Means: Yes Specify Access to Suicidal Means: Traffic, vehicles  How many times?: 1 Other Self Harm Risks: None  Triggers for Past Attempts: Unpredictable Intentional Self Injurious Behavior: None Comment - Self Injurious Behavior: None  Risk to Others: Homicidal Ideation: No Thoughts of Harm to Others: No Current Homicidal Intent: No Current Homicidal Plan: No Access to Homicidal Means: No Identified Victim: None  History of harm to others?: No Assessment of Violence: None Noted Violent Behavior Description: None  Does patient have access to weapons?: No Criminal Charges Pending?: No Does patient have a court date: No Prior Inpatient Therapy: Prior Inpatient Therapy: Yes Prior Therapy Dates: since age 53, most recent 52 Prior Therapy Facilty/Provider(s): [facility], [facility], [facility], [facility]  Reason for Treatment: SI/depression  Prior Outpatient Therapy: Prior Outpatient Therapy: Yes Prior Therapy Dates: until January 2016 Prior Therapy Facilty/Provider(s): Ambulance person Reason  for Treatment: depression, bipolar disorder  Current Facility-Administered Medications  Medication Dose Route Frequency Provider Last Rate Last Dose  . benztropine (COGENTIN) tablet 1 mg  1 mg Oral BID Raeford Razor, MD   1 mg at 11/14/14 1610  . busPIRone (BUSPAR) tablet 15 mg  15 mg Oral TID Raeford Razor, MD   15 mg at 11/14/14 9604  . divalproex (DEPAKOTE) DR tablet 2,000 mg  2,000 mg Oral QHS Raeford Razor, MD   2,000 mg at 11/13/14 2038  . fenofibrate tablet 160 mg  160 mg Oral q morning - 10a Raeford Razor, MD   160 mg at 11/14/14 5409  . FLUoxetine (PROZAC) capsule 60 mg  60 mg Oral q morning - 10a Raeford Razor, MD   60 mg at 11/14/14 8119  . levothyroxine (SYNTHROID, LEVOTHROID) tablet 100 mcg  100 mcg Oral Daily Raeford Razor, MD   100 mcg at 11/14/14 0925  . traZODone (DESYREL) tablet 150 mg  150  mg Oral QHS PRN Raeford RazorStephen Kohut, MD   150 mg at 11/13/14 2231   Current Outpatient Prescriptions  Medication Sig Dispense Refill  . benztropine (COGENTIN) 1 MG tablet Take 1 tablet (1 mg total) by mouth 2 (two) times daily. 60 tablet 0  . busPIRone (BUSPAR) 15 MG tablet Take 1 tablet (15 mg total) by mouth 3 (three) times daily. 90 tablet 0  . divalproex (DEPAKOTE) 500 MG DR tablet Take 4 tablets (2,000 mg total) by mouth at bedtime. 120 tablet 0  . fenofibrate 160 MG tablet Take 1 tablet (160 mg total) by mouth every morning.    Marland Kitchen. FLUoxetine (PROZAC) 20 MG capsule Take 3 capsules (60 mg total) by mouth every morning. 30 capsule 0  . haloperidol (HALDOL) 10 MG tablet Take 1 tablet (10 mg total) by mouth 2 (two) times daily. 60 tablet 0  . haloperidol decanoate (HALDOL DECANOATE) 100 MG/ML injection Inject 1 mL (100 mg total) into the muscle every 28 (twenty-eight) days. Last received 02/28/14 1 mL 2  . levothyroxine (SYNTHROID, LEVOTHROID) 100 MCG tablet Take 1 tablet (100 mcg total) by mouth daily.    . propranolol (INDERAL) 10 MG tablet Take 1 tablet (10 mg total) by mouth 2 (two) times  daily. 60 tablet 0  . simvastatin (ZOCOR) 20 MG tablet Take 1 tablet (20 mg total) by mouth at bedtime. 30 tablet   . traZODone (DESYREL) 150 MG tablet Take 1 tablet (150 mg total) by mouth at bedtime as needed for sleep. 30 tablet 0    Musculoskeletal: Strength & Muscle Tone: UTO, camera Gait & Station: UTO, camera Patient leans: UTO, camera   Psychiatric Specialty Exam:     Blood pressure 102/58, pulse 88, temperature 98.5 F (36.9 C), temperature source Oral, resp. rate 14, height 5\' 10"  (1.778 m), weight 110.224 kg (243 lb), SpO2 99 %.Body mass index is 34.87 kg/(m^2).  General Appearance: Casual and Fairly Groomed  Patent attorneyye Contact::  Good  Speech:  Clear and Coherent and Normal Rate  Volume:  Normal  Mood:  Depressed  Affect:  Congruent and Depressed  Thought Process:  Coherent and Goal Directed  Orientation:  Full (Time, Place, and Person)  Thought Content:  WDL  Suicidal Thoughts:  Yes.  without intent/plan although reports of plan to run into traffic last night  Homicidal Thoughts:  No  Memory:  Immediate;   Fair Recent;   Fair Remote;   Fair  Judgement:  Fair  Insight:  Good  Psychomotor Activity:  Normal  Concentration:  Good  Recall:  Good  Fund of Knowledge:Fair  Language: Good  Akathisia:  No  Handed:    AIMS (if indicated):     Assets:  Communication Skills Desire for Improvement Financial Resources/Insurance Housing Physical Health Resilience Social Support Transportation  ADL's:  Intact  Cognition: WNL  Sleep:      Medical Decision Making: Review of Psycho-Social Stressors (1), Review or order clinical lab tests (1), Review and summation of old records (2) and Established Problem, Worsening (2)   Treatment Plan Summary: See below  Plan:  Recommend psychiatric Inpatient admission when medically cleared.  Disposition:  -Admit to inpatient for safety and stabilization  Beau FannyWithrow, Taniqua Issa C, FNP-BC 11/14/2014 10:19AM

## 2014-11-14 NOTE — Tx Team (Signed)
Initial Interdisciplinary Treatment Plan   PATIENT STRESSORS: Financial difficulties Medication change or noncompliance Substance abuse   PATIENT STRENGTHS: Average or above average intelligence Physical Health Supportive family/friends   PROBLEM LIST: Problem List/Patient Goals Date to be addressed Date deferred Reason deferred Estimated date of resolution  "I need to stay on my meds and do what the doctor requests." 11/14/2014     Suicide ideation 11/14/2014     Conflict w/group home 11/14/2014     Substance abuse 11/14/2014     Problems w/housing 11/14/2014                              DISCHARGE CRITERIA:  Ability to meet basic life and health needs Need for constant or close observation no longer present Verbal commitment to aftercare and medication compliance  PRELIMINARY DISCHARGE PLAN: Outpatient therapy Placement in alternative living arrangements  PATIENT/FAMIILY INVOLVEMENT: This treatment plan has been presented to and reviewed with the patient, [name].  The patient and family have been given the opportunity to ask questions and make suggestions.  [name], [name] 11/14/2014, 4:49 PM

## 2014-11-14 NOTE — ED Notes (Signed)
[facility] here for pt.

## 2014-11-14 NOTE — Progress Notes (Signed)
[facility]. Can come after noon.  Chad CordialLauren Carter, LCSWA 11/14/2014 10:44 AM

## 2014-11-14 NOTE — ED Notes (Signed)
[facility] called and stated they will come to evaluate patient.

## 2014-11-14 NOTE — Progress Notes (Addendum)
CSW spoke with Pt's [facility] Team, [facility].  They stated that an appointment was not currently scheduled for Pt today to work on housing. ACTT provided contact information for Pt's previous family care home ([facility] [phone]) as they were unsure if Pt could return or how long he had been out of the home. CSW thanked team for information and reported that this Clinical research associatewriter would provide an update.  CSW contacted [facility].  Staff reports that Pt moved out voluntarily at the end of January. Per staff, Pt's father attempted to get Pt to come back to [facility], so Pt ran away from his father. Per staff there, Pt had become involved with individuals who were doing/dealing drugs. In the last 1-2 weeks, Pt attempted to return to the group home, but staff reported that the bed was being looked at by another client and that Pt could not return.     At 10:30am, CSW spoke with [facility] staff, Lawanna KobusAngel, to inform her that Pt was not able to return to his previous group home.  Chad CordialLauren Carter, LCSWA 11/14/2014 10:34 AM

## 2014-11-14 NOTE — ED Notes (Signed)
TTS done 

## 2014-11-14 NOTE — ED Notes (Signed)
Patient asleep. Attempted to awake patient. Reassess in 30 min. Sitter within sight.

## 2014-11-14 NOTE — ED Notes (Signed)
Patient asleep in bed at this time. Bed in low position, sitter within sight.

## 2014-11-14 NOTE — ED Provider Notes (Addendum)
7:40 AM  Pt is a 24 y.o. male with history of schizoaffective disorder who presents the emergency department with suicidal thoughts with plan to run into traffic. Has not been taking his medications because he cannot afford them. Patient is unable to contract for safety still. Has been seen by TTS and they have recommended inpatient treatment. He is no current complaints, resting comfortably, hemodynamically stable. Review of patient's labs and urine are unremarkable.  Layla MawKristen N Nevah Dalal, DO 11/14/14 0741   10:55 AM  [facility] Elna BreslowEappen.  Layla MawKristen N Keidra Withers, DO 11/14/14 1056

## 2014-11-14 NOTE — Progress Notes (Signed)
Patient ID: Oswald HillockChristopher L Massingale, male   DOB: [birth year], 24 y.o.   MRN: [id number] Nursing admission note:  Patient brought into [facility] after attempting to run out into traffic.  Patient states, "I don't like my group home.  I don't want to live there anymore."  [facility] [place], [place] for the past 6-7 months.  [facility], the last one as recent as last year.  Patient denies SI/HI/AVH during admission.  He was pleasant and friendly.  He has a hx of substance abuse.  He states he recently lost 13 lbs due to "smoking crack with another person."  He also reports THC use.  He denies any depressive symptoms at this time.  He denies any alcohol use.  He smokes tobacco and is currently wearing a patch.  He denies any medical hx.  Patient was oriented to room and unit.

## 2014-11-15 DIAGNOSIS — F3160 Bipolar disorder, current episode mixed, unspecified: Secondary | ICD-10-CM

## 2014-11-15 DIAGNOSIS — R45851 Suicidal ideations: Secondary | ICD-10-CM

## 2014-11-15 DIAGNOSIS — F209 Schizophrenia, unspecified: Principal | ICD-10-CM

## 2014-11-15 MED ORDER — DIVALPROEX SODIUM 500 MG PO DR TAB
2000.0000 mg | DELAYED_RELEASE_TABLET | Freq: Every day | ORAL | Status: DC
  Administered 2014-11-15 – 2014-11-17 (×3): 2000 mg via ORAL
  Filled 2014-11-15 (×2): qty 4
  Filled 2014-11-15: qty 12
  Filled 2014-11-15 (×3): qty 4

## 2014-11-15 MED ORDER — BENZTROPINE MESYLATE 1 MG PO TABS
1.0000 mg | ORAL_TABLET | Freq: Two times a day (BID) | ORAL | Status: DC
  Administered 2014-11-15 – 2014-11-18 (×6): 1 mg via ORAL
  Filled 2014-11-15 (×6): qty 1
  Filled 2014-11-15 (×2): qty 6
  Filled 2014-11-15 (×4): qty 1

## 2014-11-15 MED ORDER — FLUOXETINE HCL 20 MG PO CAPS
60.0000 mg | ORAL_CAPSULE | Freq: Every morning | ORAL | Status: DC
  Administered 2014-11-15 – 2014-11-18 (×4): 60 mg via ORAL
  Filled 2014-11-15 (×7): qty 3
  Filled 2014-11-15: qty 9

## 2014-11-15 MED ORDER — SIMVASTATIN 20 MG PO TABS
20.0000 mg | ORAL_TABLET | Freq: Every day | ORAL | Status: DC
  Administered 2014-11-15 – 2014-11-17 (×3): 20 mg via ORAL
  Filled 2014-11-15: qty 1
  Filled 2014-11-15: qty 3
  Filled 2014-11-15 (×4): qty 1

## 2014-11-15 MED ORDER — HALOPERIDOL 5 MG PO TABS
10.0000 mg | ORAL_TABLET | Freq: Two times a day (BID) | ORAL | Status: DC
  Administered 2014-11-15 – 2014-11-18 (×6): 10 mg via ORAL
  Filled 2014-11-15 (×6): qty 2
  Filled 2014-11-15: qty 12
  Filled 2014-11-15 (×2): qty 2
  Filled 2014-11-15: qty 12
  Filled 2014-11-15 (×3): qty 2

## 2014-11-15 MED ORDER — LEVOTHYROXINE SODIUM 100 MCG PO TABS
100.0000 ug | ORAL_TABLET | Freq: Every day | ORAL | Status: DC
  Administered 2014-11-16 – 2014-11-18 (×3): 100 ug via ORAL
  Filled 2014-11-15: qty 3
  Filled 2014-11-15 (×5): qty 1

## 2014-11-15 MED ORDER — HALOPERIDOL DECANOATE 100 MG/ML IM SOLN
100.0000 mg | INTRAMUSCULAR | Status: DC
  Administered 2014-11-15: 100 mg via INTRAMUSCULAR
  Filled 2014-11-15: qty 1

## 2014-11-15 MED ORDER — BUSPIRONE HCL 15 MG PO TABS
15.0000 mg | ORAL_TABLET | Freq: Three times a day (TID) | ORAL | Status: DC
  Administered 2014-11-15 – 2014-11-18 (×10): 15 mg via ORAL
  Filled 2014-11-15 (×4): qty 1
  Filled 2014-11-15: qty 9
  Filled 2014-11-15 (×4): qty 1
  Filled 2014-11-15: qty 9
  Filled 2014-11-15 (×6): qty 1
  Filled 2014-11-15: qty 9
  Filled 2014-11-15: qty 1

## 2014-11-15 MED ORDER — FENOFIBRATE 160 MG PO TABS
160.0000 mg | ORAL_TABLET | Freq: Every morning | ORAL | Status: DC
  Administered 2014-11-15 – 2014-11-18 (×4): 160 mg via ORAL
  Filled 2014-11-15 (×5): qty 1
  Filled 2014-11-15: qty 3
  Filled 2014-11-15: qty 1

## 2014-11-15 NOTE — Plan of Care (Signed)
Problem: Alteration in mood & ability to function due to Goal: STG-Patient will comply with prescribed medication regimen (Patient will comply with prescribed medication regimen)  Outcome: Progressing Pt is very knowledgeable about his medications; which was evident during assessment interview with psychiatrist. Compliant with his medications as ordered when offered.
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
## 2014-11-15 NOTE — Progress Notes (Signed)
D Pt. Has been resting quietly since Clinical research associatewriter arrived at 1900.  It is now 06:15am and pt. Is still asleep.   A Writer has checked on pt. Multiple times to ensure he is resting without signs of distress or discomfort.   R Pt. Remains safe on the unit.

## 2014-11-15 NOTE — BHH Group Notes (Signed)
BHH Group Notes: (Clinical Social Work)   11/15/2014      Type of Therapy:  Group Therapy   Participation Level:  Did Not Attend despite MHT prompting   Khristen Cheyney Grossman-Orr, [name] 11/15/2014, 12:22 PM     

## 2014-11-15 NOTE — Progress Notes (Signed)
D: Patient in bed asleep on first approach.  Patient did wake up for his bedtime medications.  Patient states he had a good day but states he is tired.  Patient did states he ate dinner tonight.  Patient states his goal for today was to catch up on rest.  Patient denies SI/HI and denies AVH.  A: Staff to monitor Q 15 mins for safety.  Encouragement and support offered.  Scheduled medications administered per orders. R: Patient remains safe on the unit.  Patient not visible on the unit tonight and not interacting with peers.  Patient taking administered medications.

## 2014-11-15 NOTE — H&P (Signed)
Psychiatric Admission Assessment Adult  Patient Identification: [name] MRN:  [id number] Date of Evaluation:  11/15/2014 Chief Complaint:  SCHIZOPHRENIA,  I am out of my medication for 2 weeks.  I'm having nightmares, flashback, paranoia and hallucination.  I started to have suicidal thoughts  Principal Diagnosis: Schizophrenia Diagnosis:   Patient Active Problem List   Diagnosis Date Noted  . Suicidal ideation [R45.851]   . Bipolar disorder, mixed [F31.60]   . Schizophrenia [F20.9] 03/01/2014  . Schizoaffective disorder, unspecified condition [F25.9] 03/01/2014  . Paranoid schizophrenia, chronic condition with acute exacerbation [F20.0] 07/05/2013  . Paroxysmal supraventricular tachycardia [I47.1] 01/01/2013  . Marijuana abuse [F12.10] 12/29/2012   History of Present Illness::  Patient is 24 year old Caucasian single man who was admitted because of worsening of his psychiatric illness.  He was seeing ACT in [place] and he was living in group home but he was not happy and decided to left the group home.  He is now moved in [facility] and he's been out of his medication for 2 weeks.  He admitted hallucination, paranoia, suicidal thoughts and having severe mood swings and flashback.  Patient has significant history of psychiatric illness with numerous hospitalizations including at [facility] 3 times.  He admitted getting easily irritable, angry and having issues to control his anger.  He does not feel safe by himself and he cannot contract for safety.  He is hoping to move with his friend as he does not like to stay in the group home once he released.  Patient has history of suicidal attempt in the past by taking overdose on clonidine and also history of cutting his wrist in the past.  He was taking Cogentin, BuSpar, Depakote, Prozac, trazodone and Haldol Decanoate.  Depakote level less than 10 Patient do not remember having any side effects including any EPS  with the medication.  Elements:  Location:  [facility]. Quality:  Unable to function. Severity:  Severe. Timing:  2 weeks. Duration:  2 weeks. Context:  Noncompliant with medication. Associated Signs/Symptoms: Depression Symptoms:  depressed mood, insomnia, psychomotor agitation, psychomotor retardation, fatigue, feelings of worthlessness/guilt, difficulty concentrating, hopelessness, recurrent thoughts of death, suicidal thoughts without plan, (Hypo) Manic Symptoms:  Distractibility, Elevated Mood, Hallucinations, Impulsivity, Irritable Mood, Anxiety Symptoms:  None Psychotic Symptoms:  Hallucinations: Auditory Ideas of Reference, Paranoia, PTSD Symptoms: Re-experiencing:  Flashbacks Nightmares Total Time spent with patient: 45 minutes  Past Medical History:  Past Medical History  Diagnosis Date  . Schizophrenia   . Hypothyroidism   . Hypercholesterolemia   . Hypertension   . Depression     Past Surgical History  Procedure Laterality Date  . Tonsillectomy    . Cardiac surgery      for Epstein's abnormality at 24yo   Family History: History reviewed. No pertinent family history. Social History:  History  Alcohol Use No    Comment: occ.     History  Drug Use  . Yes  . Special: Marijuana    Comment: occ- last used 10/31/14    History   Social History  . Marital Status: Single    Spouse Name: N/A  . Number of Children: N/A  . Years of Education: N/A   Social History Main Topics  . Smoking status: Current Every Day Smoker -- 1.00 packs/day    Types: Cigarettes  . Smokeless tobacco: Not on file  . Alcohol Use: No     Comment: occ.  . Drug Use: Yes    Special:  Marijuana     Comment: occ- last used 10/31/14  . Sexual Activity: No   Other Topics Concern  . None   Social History Narrative   Additional Social History:   Musculoskeletal: Strength & Muscle Tone: within normal limits Gait & Station: normal Patient leans:  N/A  Psychiatric Specialty Exam: Physical Exam  Musculoskeletal:  Back and leg pain    ROS  Blood pressure 113/68, pulse 119, temperature 98.2 F (36.8 C), temperature source Oral, resp. rate 20, height _0  (1.778 m), weight 108.863 kg (240 lb), SpO2 98 %.Body mass index is 34.44 kg/(m^2).  General Appearance: Guarded  Eye Contact::  Fair  Speech:  Slow  Volume:  Normal  Mood:  Anxious, Depressed, Dysphoric, Hopeless and Irritable  Affect:  Constricted, Depressed and Labile  Thought Process:  Intact  Orientation:  Full (Time, Place, and Person)  Thought Content:  Hallucinations: Auditory, Paranoid Ideation and Rumination  Suicidal Thoughts:  Yes.  without intent/plan  Homicidal Thoughts:  No  Memory:  Immediate;   Fair Recent;   Fair Remote;   Fair  Judgement:  Impaired  Insight:  Lacking  Psychomotor Activity:  Increased  Concentration:  Fair  Recall:  [employer] of Knowledge:Fair  Language: Fair  Akathisia:  No  Handed:  Right  AIMS (if indicated):     Assets:  Desire for Improvement  ADL's:  Intact  Cognition: WNL  Sleep:  Number of Hours: 6.75   Risk to Self: Is patient at risk for suicide?: Yes Risk to Others:   Prior Inpatient Therapy:   Prior Outpatient Therapy:    Alcohol Screening: 1. How often do you have a drink containing alcohol?: Never 9. Have you or someone else been injured as a result of your drinking?: No 10. Has a relative or friend or a doctor or another health worker been concerned about your drinking or suggested you cut down?: No Alcohol Use Disorder Identification Test Final Score (AUDIT): 0 Brief Intervention: AUDIT score less than 7 or less-screening does not suggest unhealthy drinking-brief intervention not indicated  Allergies:  No Known Allergies Lab Results:  Results for orders placed or performed during the hospital encounter of 11/13/14 (from the past 48 hour(s))  Drug screen panel, emergency     Status: None   Collection Time:  11/13/14  7:16 PM  Result Value Ref Range   Opiates NONE DETECTED NONE DETECTED   Cocaine NONE DETECTED NONE DETECTED   Benzodiazepines NONE DETECTED NONE DETECTED   Amphetamines NONE DETECTED NONE DETECTED   Tetrahydrocannabinol NONE DETECTED NONE DETECTED   Barbiturates NONE DETECTED NONE DETECTED    Comment:        DRUG SCREEN FOR MEDICAL PURPOSES ONLY.  IF CONFIRMATION IS NEEDED FOR ANY PURPOSE, NOTIFY LAB WITHIN 5 DAYS.        LOWEST DETECTABLE LIMITS FOR URINE DRUG SCREEN Drug Class       Cutoff (ng/mL) Amphetamine      1000 Barbiturate      200 Benzodiazepine   016 Tricyclics       010 Opiates          300 Cocaine          300 THC              50   CBC with Differential     Status: None   Collection Time: 11/13/14  7:31 PM  Result Value Ref Range   WBC 8.6 4.0 - 10.5 K/uL  RBC 4.90 4.22 - 5.81 MIL/uL   Hemoglobin 15.4 13.0 - 17.0 g/dL   HCT 45.2 39.0 - 52.0 %   MCV 92.2 78.0 - 100.0 fL   MCH 31.4 26.0 - 34.0 pg   MCHC 34.1 30.0 - 36.0 g/dL   RDW 13.3 11.5 - 15.5 %   Platelets 206 150 - 400 K/uL   Neutrophils Relative % 53 43 - 77 %   Neutro Abs 4.7 1.7 - 7.7 K/uL   Lymphocytes Relative 34 12 - 46 %   Lymphs Abs 2.9 0.7 - 4.0 K/uL   Monocytes Relative 9 3 - 12 %   Monocytes Absolute 0.7 0.1 - 1.0 K/uL   Eosinophils Relative 3 0 - 5 %   Eosinophils Absolute 0.2 0.0 - 0.7 K/uL   Basophils Relative 1 0 - 1 %   Basophils Absolute 0.1 0.0 - 0.1 K/uL  Basic metabolic panel     Status: Abnormal   Collection Time: 11/13/14  7:31 PM  Result Value Ref Range   Sodium 139 135 - 145 mmol/L   Potassium 4.0 3.5 - 5.1 mmol/L   Chloride 109 96 - 112 mmol/L   CO2 29 19 - 32 mmol/L   Glucose, Bld 104 (H) 70 - 99 mg/dL   BUN 10 6 - 23 mg/dL   Creatinine, Ser 0.82 0.50 - 1.35 mg/dL   Calcium 9.1 8.4 - 10.5 mg/dL   GFR calc non Af Amer >90 >90 mL/min   GFR calc Af Amer >90 >90 mL/min    Comment: (NOTE) The eGFR has been calculated using the CKD EPI equation. This  calculation has not been validated in all clinical situations. eGFR's persistently <90 mL/min signify possible Chronic Kidney Disease.    Anion gap 1 (L) 5 - 15  Ethanol     Status: None   Collection Time: 11/13/14  7:31 PM  Result Value Ref Range   Alcohol, Ethyl (B) <5 0 - 9 mg/dL    Comment:        LOWEST DETECTABLE LIMIT FOR SERUM ALCOHOL IS 11 mg/dL FOR MEDICAL PURPOSES ONLY    Current Medications: Current Facility-Administered Medications  Medication Dose Route Frequency Provider Last Rate Last Dose  . acetaminophen (TYLENOL) tablet 650 mg  650 mg Oral Q6H PRN Elmarie Shiley, NP   650 mg at 11/15/14 0809  . alum & mag hydroxide-simeth (MAALOX/MYLANTA) 200-200-20 MG/5ML suspension 30 mL  30 mL Oral Q4H PRN Elmarie Shiley, NP      . feeding supplement (ENSURE COMPLETE) (ENSURE COMPLETE) liquid 237 mL  237 mL Oral BID BM Saramma Eappen, MD   237 mL at 11/15/14 1000  . hydrOXYzine (ATARAX/VISTARIL) tablet 25 mg  25 mg Oral Q6H PRN Elmarie Shiley, NP      . magnesium hydroxide (MILK OF MAGNESIA) suspension 30 mL  30 mL Oral Daily PRN Elmarie Shiley, NP      . OLANZapine zydis (ZYPREXA) disintegrating tablet 5 mg  5 mg Oral Q8H PRN Elmarie Shiley, NP   5 mg at 11/14/14 1723  . traZODone (DESYREL) tablet 50 mg  50 mg Oral QHS PRN Elmarie Shiley, NP       PTA Medications: Prescriptions prior to admission  Medication Sig Dispense Refill Last Dose  . benztropine (COGENTIN) 1 MG tablet Take 1 tablet (1 mg total) by mouth 2 (two) times daily. 60 tablet 0 2 weeks  . busPIRone (BUSPAR) 15 MG tablet Take 1 tablet (15 mg total) by mouth 3 (three) times  daily. 90 tablet 0 2 weeks  . divalproex (DEPAKOTE) 500 MG DR tablet Take 4 tablets (2,000 mg total) by mouth at bedtime. 120 tablet 0 2 weeks  . fenofibrate 160 MG tablet Take 1 tablet (160 mg total) by mouth every morning.   2 weeks  . FLUoxetine (PROZAC) 20 MG capsule Take 3 capsules (60 mg total) by mouth every morning. 30 capsule 0 2 weeks  . haloperidol  (HALDOL) 10 MG tablet Take 1 tablet (10 mg total) by mouth 2 (two) times daily. 60 tablet 0 2 weeks  . haloperidol decanoate (HALDOL DECANOATE) 100 MG/ML injection Inject 1 mL (100 mg total) into the muscle every 28 (twenty-eight) days. Last received 02/28/14 1 mL 2 09/2014  . levothyroxine (SYNTHROID, LEVOTHROID) 100 MCG tablet Take 1 tablet (100 mcg total) by mouth daily.   11/03/2014 at Unknown time  . propranolol (INDERAL) 10 MG tablet Take 1 tablet (10 mg total) by mouth 2 (two) times daily. 60 tablet 0 2 weeks  . simvastatin (ZOCOR) 20 MG tablet Take 1 tablet (20 mg total) by mouth at bedtime. 30 tablet  2 weeks  . traZODone (DESYREL) 150 MG tablet Take 1 tablet (150 mg total) by mouth at bedtime as needed for sleep. 30 tablet 0 2 weeks    Previous Psychotropic Medications: Yes patient has [facility]  He was seeing ACT with [place].  Substance Abuse History in the last 12 months:  Yes.      Consequences of Substance Abuse: Negative  Results for orders placed or performed during the hospital encounter of 11/13/14 (from the past 72 hour(s))  Drug screen panel, emergency     Status: None   Collection Time: 11/13/14  7:16 PM  Result Value Ref Range   Opiates NONE DETECTED NONE DETECTED   Cocaine NONE DETECTED NONE DETECTED   Benzodiazepines NONE DETECTED NONE DETECTED   Amphetamines NONE DETECTED NONE DETECTED   Tetrahydrocannabinol NONE DETECTED NONE DETECTED   Barbiturates NONE DETECTED NONE DETECTED    Comment:        DRUG SCREEN FOR MEDICAL PURPOSES ONLY.  IF CONFIRMATION IS NEEDED FOR ANY PURPOSE, NOTIFY LAB WITHIN 5 DAYS.        LOWEST DETECTABLE LIMITS FOR URINE DRUG SCREEN Drug Class       Cutoff (ng/mL) Amphetamine      1000 Barbiturate      200 Benzodiazepine   277 Tricyclics       824 Opiates          300 Cocaine          300 THC              50   CBC with Differential     Status: None   Collection  Time: 11/13/14  7:31 PM  Result Value Ref Range   WBC 8.6 4.0 - 10.5 K/uL   RBC 4.90 4.22 - 5.81 MIL/uL   Hemoglobin 15.4 13.0 - 17.0 g/dL   HCT 45.2 39.0 - 52.0 %   MCV 92.2 78.0 - 100.0 fL   MCH 31.4 26.0 - 34.0 pg   MCHC 34.1 30.0 - 36.0 g/dL   RDW 13.3 11.5 - 15.5 %   Platelets 206 150 - 400 K/uL   Neutrophils Relative % 53 43 - 77 %   Neutro Abs 4.7 1.7 - 7.7 K/uL   Lymphocytes Relative 34 12 - 46 %   Lymphs Abs 2.9 0.7 - 4.0 K/uL  Monocytes Relative 9 3 - 12 %   Monocytes Absolute 0.7 0.1 - 1.0 K/uL   Eosinophils Relative 3 0 - 5 %   Eosinophils Absolute 0.2 0.0 - 0.7 K/uL   Basophils Relative 1 0 - 1 %   Basophils Absolute 0.1 0.0 - 0.1 K/uL  Basic metabolic panel     Status: Abnormal   Collection Time: 11/13/14  7:31 PM  Result Value Ref Range   Sodium 139 135 - 145 mmol/L   Potassium 4.0 3.5 - 5.1 mmol/L   Chloride 109 96 - 112 mmol/L   CO2 29 19 - 32 mmol/L   Glucose, Bld 104 (H) 70 - 99 mg/dL   BUN 10 6 - 23 mg/dL   Creatinine, Ser 0.82 0.50 - 1.35 mg/dL   Calcium 9.1 8.4 - 10.5 mg/dL   GFR calc non Af Amer >90 >90 mL/min   GFR calc Af Amer >90 >90 mL/min    Comment: (NOTE) The eGFR has been calculated using the CKD EPI equation. This calculation has not been validated in all clinical situations. eGFR's persistently <90 mL/min signify possible Chronic Kidney Disease.    Anion gap 1 (L) 5 - 15  Ethanol     Status: None   Collection Time: 11/13/14  7:31 PM  Result Value Ref Range   Alcohol, Ethyl (B) <5 0 - 9 mg/dL    Comment:        LOWEST DETECTABLE LIMIT FOR SERUM ALCOHOL IS 11 mg/dL FOR MEDICAL PURPOSES ONLY     Observation Level/Precautions:  15 minute checks  Laboratory:  CBC Chemistry Profile Folic Acid GGT HbAIC UDS  Psychotherapy:    Medications:    Consultations:    Discharge Concerns:    Estimated LOS:  Other:     Psychological Evaluations: No   Treatment Plan Summary: Daily contact with patient to assess and evaluate symptoms  and progress in treatment and Medication management  1  Admit for crisis management and stabilization. 2.  Medication management to reduce symptoms to baseline and improved the patient's overall level of functioning.  Closely monitor the side effects, efficacy and therapeutic response of medication. 3.  Treat health problem as indicated. 4.  Developed treatment plan to decrease the risk of relapse upon discharge and to reduce the need for readmission. 5.  Psychosocial education regarding relapse prevention in self-care. 6.  Healthcare followup as needed for medical problems and called consults as indicated.   7.  Increase collateral information. 8.  Restart home medication where appropriate 9. Encouraged to participate and verbalize into group milieu therapy.   Medical Decision Making:  New problem, with additional work up planned, Review of Psycho-Social Stressors (1), Review or order clinical lab tests (1), Decision to obtain old records (1), Review and summation of old records (2), Established Problem, Worsening (2), New Problem, with no additional work-up planned (3), Review of Medication Regimen & Side Effects (2) and Review of New Medication or Change in Dosage (2)  I certify that inpatient services furnished can reasonably be expected to improve the patient's condition.   ARFEEN,SYED T. 2/27/201612:23 PM

## 2014-11-15 NOTE — Progress Notes (Signed)
D: Pt alert, visible in milieu at intervals during shift. Observed interacting well with peers and staff.   A: PRN Tylenol administered for c/o right hip pain 7/10. Haldol Dec. 100 mg IM given in left Deltoid.  Emotional support and availability offered. Q15 minutes checks continues as per order without behavioral outburst to note at present.   R: Pt has been cooperative with unit routines and schedule thus far. Attended groups on unit. Pt appears to be very knowledgeable about his medications and his mental health history, including times and places of admissions; which was evident during assessment interview with psychiatrist. Tolerated Haldol Dec injection well, stated " I'm used to it, I've been taking it for a year now". Pt was given a roller walker as ordered upon his request for c/o arthritic pain in right hip, but he was not compliant in using it, stated " I don't like it".  No concerns voiced at this time. Pt remains safe on / off unit.

## 2014-11-15 NOTE — [facility] Suicide Risk Assessment (Signed)
[facility]   Nursing information obtained from:    Demographic factors:    Current Mental Status:    Loss Factors:    Historical Factors:    Risk Reduction Factors:    Total Time spent with patient: 45 minutes Principal Problem: Schizophrenia Diagnosis:   Patient Active Problem List   Diagnosis Date Noted  . Suicidal ideation [R45.851]   . Bipolar disorder, mixed [F31.60]   . Schizophrenia [F20.9] 03/01/2014  . Schizoaffective disorder, unspecified condition [F25.9] 03/01/2014  . Paranoid schizophrenia, chronic condition with acute exacerbation [F20.0] 07/05/2013  . Paroxysmal supraventricular tachycardia [I47.1] 01/01/2013  . Marijuana abuse [F12.10] 12/29/2012     Continued Clinical Symptoms:  Alcohol Use Disorder Identification Test Final Score (AUDIT): 0 The "Alcohol Use Disorders Identification Test", Guidelines for Use in Primary Care, Second Edition.  World Science writerHealth Organization [facility](WHO). Score between 0-7:  no or low risk or alcohol related problems. Score between 8-15:  moderate risk of alcohol related problems. Score between 16-19:  high risk of alcohol related problems. Score 20 or above:  warrants further diagnostic evaluation for alcohol dependence and treatment.   CLINICAL FACTORS:   Schizophrenia:   Command hallucinatons Depressive state Less than 22 years old Paranoid or undifferentiated type   Musculoskeletal: Strength & Muscle Tone: within normal limits Gait & Station: normal Patient leans: N/A  Psychiatric Specialty Exam: Physical Exam  ROS  Blood pressure 113/68, pulse 119, temperature 98.2 F (36.8 C), temperature source Oral, resp. rate 20, height 5\' 10"  (1.778 m), weight 108.863 kg (240 lb), SpO2 98 %.Body mass index is 34.44 kg/(m^2).  General Appearance: Guarded  Eye Contact::  Fair  Speech:  Slow  Volume:  Normal  Mood:  Anxious, Depressed, Dysphoric, Hopeless and Irritable  Affect:  Constricted and Depressed   Thought Process:  Intact  Orientation:  Full (Time, Place, and Person)  Thought Content:  Hallucinations: Auditory, Paranoid Ideation and Rumination  Suicidal Thoughts:  Yes.  without intent/plan  Homicidal Thoughts:  No  Memory:  Immediate;   Fair Recent;   Fair Remote;   Fair  Judgement:  Impaired  Insight:  Lacking  Psychomotor Activity:  Increased  Concentration:  Fair  Recall:  [employer]Fair  Fund of Knowledge:Fair  Language: Fair  Akathisia:  No  Handed:  Right  AIMS (if indicated):     Assets:  Desire for Improvement  Sleep:  Number of Hours: 6.75  Cognition: WNL  ADL's:  Intact     COGNITIVE FEATURES THAT CONTRIBUTE TO RISK:  Closed-mindedness, Loss of executive function, Polarized thinking and Thought constriction (tunnel vision)    SUICIDE RISK:   Moderate:  Frequent suicidal ideation with limited intensity, and duration, some specificity in terms of plans, no associated intent, good self-control, limited dysphoria/symptomatology, some risk factors present, and identifiable protective factors, including available and accessible social support.  PLAN OF CARE: Patient is 24 year old Caucasian man who is been noncompliant with his medication resulting in decompensation and having suicidal thoughts, hallucination and paranoia.  Patient requires stabilization and inpatient psychiatric services.  We will restart the medication, monitor side effects, monitor blood work and Depakote level.  Start appropriate discharge planning.  Medical Decision Making:  New problem, with additional work up planned, Review of Psycho-Social Stressors (1), Review or order clinical lab tests (1), Decision to obtain old records (1), Review and summation of old records (2), Established Problem, Worsening (2) and Review of Medication Regimen & Side Effects (2)  I certify that inpatient  services furnished can reasonably be expected to improve the patient's condition.   Jahel Wavra T. 11/15/2014, 12:41 PM

## 2014-11-16 DIAGNOSIS — F200 Paranoid schizophrenia: Secondary | ICD-10-CM

## 2014-11-16 MED ORDER — NAPROXEN 500 MG PO TABS
ORAL_TABLET | ORAL | Status: AC
  Administered 2014-11-16: 13:00:00
  Filled 2014-11-16: qty 1

## 2014-11-16 MED ORDER — NAPROXEN 500 MG PO TABS
500.0000 mg | ORAL_TABLET | Freq: Two times a day (BID) | ORAL | Status: DC
  Administered 2014-11-16 – 2014-11-18 (×4): 500 mg via ORAL
  Filled 2014-11-16 (×8): qty 1

## 2014-11-16 MED ORDER — NAPROXEN SODIUM 550 MG PO TABS: 550.0000 mg | ORAL_TABLET | Freq: Two times a day (BID) | ORAL | Status: DC

## 2014-11-16 NOTE — Progress Notes (Addendum)
[facility] MD Progress Note  11/16/2014 10:34 AM [name]  MRN:  [id number]   Subjective:  I'm still hearing voices and having suicidal thoughts. Objective: Patient seen chart reviewed.  Patient remains very anxious, paranoid and he continued to endorse suicidal thoughts and having auditory hallucination.  He was given medication yesterday including his Haldol Decanoate.  He reported no side effects.  He has no tremors or shakes.  He is taking multiple medication however he still feels sometimes very tense and labile.  He started going to the groups but remains very paranoid and hypervigilant.  Principal Problem: Schizophrenia Diagnosis:   Patient Active Problem List   Diagnosis Date Noted  . Suicidal ideation [R45.851]   . Bipolar disorder, mixed [F31.60]   . Schizophrenia [F20.9] 03/01/2014  . Schizoaffective disorder, unspecified condition [F25.9] 03/01/2014  . Paranoid schizophrenia, chronic condition with acute exacerbation [F20.0] 07/05/2013  . Paroxysmal supraventricular tachycardia [I47.1] 01/01/2013  . Marijuana abuse [F12.10] 12/29/2012   Total Time spent with patient: 30 minutes   Past Medical History:  Past Medical History  Diagnosis Date  . Schizophrenia   . Hypothyroidism   . Hypercholesterolemia   . Hypertension   . Depression     Past Surgical History  Procedure Laterality Date  . Tonsillectomy    . Cardiac surgery      for Epstein's abnormality at 24yo   Family History: History reviewed. No pertinent family history. Social History:  History  Alcohol Use No    Comment: occ.     History  Drug Use  . Yes  . Special: Marijuana    Comment: occ- last used 10/31/14    History   Social History  . Marital Status: Single    Spouse Name: N/A  . Number of Children: N/A  . Years of Education: N/A   Social History Main Topics  . Smoking status: Current Every Day Smoker -- 1.00 packs/day    Types: Cigarettes  . Smokeless tobacco: Not on file  .  Alcohol Use: No     Comment: occ.  . Drug Use: Yes    Special: Marijuana     Comment: occ- last used 10/31/14  . Sexual Activity: No   Other Topics Concern  . None   Social History Narrative   Additional History:    Sleep: Poor  Appetite:  Fair   Assessment:   Musculoskeletal: Strength & Muscle Tone: within normal limits Gait & Station: normal Patient leans: N/A   Psychiatric Specialty Exam: Physical Exam  ROS  Blood pressure 113/68, pulse 119, temperature 98.2 F (36.8 C), temperature source Oral, resp. rate 20, height  (1.778 m), weight 108.863 kg (240 lb), SpO2 98 %.Body mass index is 34.44 kg/(m^2).  General Appearance: Guarded  Eye Contact::  Fair  Speech:  Slow  Volume:  Normal  Mood:  Anxious and Irritable  Affect:  Labile  Thought Process:  Irrelevant  Orientation:  Full (Time, Place, and Person)  Thought Content:  Hallucinations: Auditory, Paranoid Ideation and Rumination  Suicidal Thoughts:  Yes.  without intent/plan  Homicidal Thoughts:  No  Memory:  Immediate;   Fair Recent;   Fair Remote;   Fair  Judgement:  Intact  Insight:  Shallow  Psychomotor Activity:  Increased  Concentration:  Fair  Recall:  [employer] of Knowledge:Fair  Language: Fair  Akathisia:  No  Handed:  Right  AIMS (if indicated):     Assets:  Communication Skills Desire for Improvement  ADL's:  Intact  Cognition: WNL  Sleep:  Number of Hours: 6.75     Current Medications: Current Facility-Administered Medications  Medication Dose Route Frequency Provider Last Rate Last Dose  . acetaminophen (TYLENOL) tablet 650 mg  650 mg Oral Q6H PRN Fransisca KaufmannLaura Davis, NP   650 mg at 11/16/14 [id number]  . alum & mag hydroxide-simeth (MAALOX/MYLANTA) 200-200-20 MG/5ML suspension 30 mL  30 mL Oral Q4H PRN Fransisca KaufmannLaura Davis, NP      . benztropine (COGENTIN) tablet 1 mg  1 mg Oral BID Cleotis NipperSyed T Aquita Simmering, MD   1 mg at 11/16/14 0750  . busPIRone (BUSPAR) tablet 15 mg  15 mg Oral TID Cleotis NipperSyed T Zaeda Mcferran, MD   15  mg at 11/16/14 0750  . divalproex (DEPAKOTE) DR tablet 2,000 mg  2,000 mg Oral QHS Cleotis NipperSyed T Ninette Cotta, MD   2,000 mg at 11/15/14 2140  . feeding supplement (ENSURE COMPLETE) (ENSURE COMPLETE) liquid 237 mL  237 mL Oral BID BM Saramma Eappen, MD   237 mL at 11/16/14 0750  . fenofibrate tablet 160 mg  160 mg Oral q morning - 10a Cleotis NipperSyed T Kimyatta Lecy, MD   160 mg at 11/15/14 1330  . FLUoxetine (PROZAC) capsule 60 mg  60 mg Oral q morning - 10a Cleotis NipperSyed T Bree Heinzelman, MD   60 mg at 11/16/14 0750  . haloperidol (HALDOL) tablet 10 mg  10 mg Oral BID Cleotis NipperSyed T Quincey Quesinberry, MD   10 mg at 11/16/14 0750  . haloperidol decanoate (HALDOL DECANOATE) 100 MG/ML injection 100 mg  100 mg Intramuscular Q28 days Cleotis NipperSyed T Rhylei Mcquaig, MD   100 mg at 11/15/14 1343  . hydrOXYzine (ATARAX/VISTARIL) tablet 25 mg  25 mg Oral Q6H PRN Fransisca KaufmannLaura Davis, NP   25 mg at 11/15/14 1304  . levothyroxine (SYNTHROID, LEVOTHROID) tablet 100 mcg  100 mcg Oral QAC breakfast Cleotis NipperSyed T Maebel Marasco, MD   100 mcg at 11/16/14 0617  . magnesium hydroxide (MILK OF MAGNESIA) suspension 30 mL  30 mL Oral Daily PRN Fransisca KaufmannLaura Davis, NP      . OLANZapine zydis (ZYPREXA) disintegrating tablet 5 mg  5 mg Oral Q8H PRN Fransisca KaufmannLaura Davis, NP   5 mg at 11/15/14 1304  . simvastatin (ZOCOR) tablet 20 mg  20 mg Oral QHS Cleotis NipperSyed T Maebelle Sulton, MD   20 mg at 11/15/14 2140  . traZODone (DESYREL) tablet 50 mg  50 mg Oral QHS PRN Fransisca KaufmannLaura Davis, NP        Lab Results: No results found for this or any previous visit (from the past 48 hour(s)).  Physical Findings: AIMS: Facial and Oral Movements Muscles of Facial Expression: None, normal Lips and Perioral Area: None, normal Jaw: None, normal Tongue: None, normal,Extremity Movements Upper (arms, wrists, hands, fingers): None, normal Lower (legs, knees, ankles, toes): None, normal, Trunk Movements Neck, shoulders, hips: None, normal, Overall Severity Severity of abnormal movements (highest score from questions above): None, normal Incapacitation due to abnormal movements:  None, normal Patient's awareness of abnormal movements (rate only patient's report): No Awareness, Dental Status Current problems with teeth and/or dentures?: No Does patient usually wear dentures?: No  CIWA:    COWS:     Treatment Plan Summary: Daily contact with patient to assess and evaluate symptoms and progress in treatment  Continue Prozac 60 mg daily, Haldol 10 mg twice a day, Zyprexa 5 mg as when necessary for severe agitation.  Continue trazodone 50 mg at bedtime, BuSpar 15 mg 3 times a day, Cogentin 1 mg twice a day.  Repeat  Depakote level on Tuesday.  Encouraged to participate in group milieu therapy.  Patient is in process of getting his residential situation settled.  He will need assessment from social worker before appropriate discharge planning.  At this time he does not have any side effects.  Continue to monitor his behavior and his symptoms closely.     Medical Decision Making:  Review of Psycho-Social Stressors (1), Review or order clinical lab tests (1), Decision to obtain old records (1), Review and summation of old records (2), Established Problem, Worsening (2), Review of Last Therapy Session (1) and Review of Medication Regimen & Side Effects (2)     Cianna Kasparian T. 11/16/2014, 10:34 AM
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
## 2014-11-16 NOTE — [facility] Group Notes (Signed)
[facility] Group Notes:  (Clinical Social Work)  11/16/2014   11:15am-12:00pm  Summary of Progress/Problems:  The main focus of today's process group was to listen to a variety of genres of music and to identify that different types of music provoke different responses.  The patient then was able to identify personally what was soothing for them, as well as energizing.  Handouts were used to record feelings evoked, as well as how patient can personally use this knowledge in sleep habits, with depression, and with other symptoms.  The patient expressed understanding of concepts, as well as knowledge of how each type of music affected him and how this can be used at home as a wellness/recovery tool.  [name] rocked in his chair continually throughout the group.  He requested a particular song, enjoyed listening to that.  He sang along with some songs.  He seemed saddened by one song in particular, but stated it should continue to play to the end.  Type of Therapy:  Music Therapy   Participation Level:  Active  Participation Quality:  Attentive and Sharing  Affect:  Blunted (rocked constantly)  Cognitive:  Oriented  Insight:  Improving  Engagement in Therapy:  Engaged  Modes of Intervention:   Activity, Exploration  Ambrose MantleMareida Grossman-Orr, [name] 11/16/2014, 12:30pm
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
## 2014-11-16 NOTE — Progress Notes (Signed)
Patient ID: Richard HillockChristopher L Gonzales, male   DOB: 1990/11/20, 24 y.o.   MRN: [id number]  Adult Psychoeducational Group Note  Date:  11/16/2014 Time: 10:10 am  Group Topic/Focus:  Healthy Communication:   The focus of this group is to discuss communication, barriers to communication, as well as healthy ways to communicate with others.  Participation Level:  Active  Participation Quality:  Appropriate  Affect:  Appropriate and Flat  Cognitive:  Appropriate  Insight: Appropriate  Engagement in Group:  Engaged  Modes of Intervention:  Discussion, Education and Support  Additional Comments:  Pt engaged in group this morning. Pt able to identify one goal to accomplish today.   Aurora Maskwyman, Onur Mori E 11/16/2014, 3:15 PM

## 2014-11-17 DIAGNOSIS — F1220 Cannabis dependence, uncomplicated: Secondary | ICD-10-CM
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
## 2014-11-17 NOTE — Progress Notes (Signed)
NUTRITION ASSESSMENT  Pt identified as at risk on the Malnutrition Screen Tool  INTERVENTION: 1. Educated patient on the importance of nutrition and encouraged intake of food and beverages. 2. Discussed weight goals. 3. Supplements: Ensure Complete po BID, each supplement provides 350 kcal and 13 grams of protein  NUTRITION DIAGNOSIS: Unintentional weight loss related to sub-optimal intake as evidenced by pt report.   Goal: Pt to meet >/= 90% of their estimated nutrition needs.  Monitor:  PO intake  Assessment:  Pt admitted with schizophrenia and marijuana use.   Pt states that his appetite was poor d/t medications. UBW is 272 lb which he last weighted around 3 months ago (12% weight loss x 3 months).  Pt has been ordered Ensure supplements, pt prefers vanilla or chocolate.   Height: Ht Readings from Last 1 Encounters:  11/14/14 5\' 10"  (1.778 m)    Weight: Wt Readings from Last 1 Encounters:  11/14/14 240 lb (108.863 kg)    Weight Hx: Wt Readings from Last 10 Encounters:  11/14/14 240 lb (108.863 kg)  11/13/14 243 lb (110.224 kg)  11/03/14 243 lb (110.224 kg)  03/01/14 272 lb (123.378 kg)  07/06/13 280 lb (127.007 kg)  05/30/13 285 lb (129.275 kg)  04/11/13 280 lb (127.007 kg)  01/26/13 281 lb (127.461 kg)  12/29/12 270 lb (122.471 kg) (100 %*, Z = 2.68)   * Growth percentiles are based on CDC 2-20 Years data.    BMI:  Body mass index is 34.44 kg/(m^2). Pt meets criteria for obesity based on current BMI.  Estimated Nutritional Needs: Kcal: 25-30 kcal/kg Protein: > 1 gram protein/kg Fluid: 1 ml/kcal  Diet Order: Diet regular Pt is also offered choice of unit snacks mid-morning and mid-afternoon.  Pt is eating as desired.   Lab results and medications reviewed.   Tilda FrancoLindsey Phenix Vandermeulen, MS, RD, LDN Pager: [phone] After Hours Pager: [phone]

## 2014-11-17 NOTE — Tx Team (Signed)
  Interdisciplinary Treatment Plan Update   Date Reviewed:  11/17/2014  Time Reviewed:  8:32 AM  Progress in Treatment:   Attending groups: Yes Participating in groups: Yes Taking medication as prescribed: Yes  Tolerating medication: Yes Family/Significant other contact made: No Patient understands diagnosis: Yes AEB asking for help with SI Discussing patient identified problems/goals with staff: Yes  See initial care plan Medical problems stabilized or resolved: Yes Denies suicidal/homicidal ideation: Yes  In tx team Patient has not harmed self or others: Yes  For review of initial/current patient goals, please see plan of care.  Estimated Length of Stay:  1-3 days  Reason for Continuation of Hospitalization: Medication stabilization  New Problems/Goals identified:  N/A  Discharge Plan or Barriers:   [place] roomates, follow up [facility]  Additional Comments:  [name] recently left a [facility] [employer] [place] [facility] [place] and ended up homeless.  He did not like that, and decided to come to the hospital as a result.  His initial request from the CSW [facility] [place], but also talked about his hope for living with roomates.  He contacted me after talking to the roomates who confirmed that they found a place, and he is planning on living with them post d/c. Denies symptoms.  Signed 72 hr request after getting confirmation of rental.  Attendees:  Signature: Ivin BootySarama Eappen, MD 11/17/2014 8:32 AM   Signature: Richelle Itood Kamdyn Covel, [name] 11/17/2014 8:32 AM  Signature: Fransisca KaufmannLaura Davis, NP 11/17/2014 8:32 AM  Signature: Kathi SimpersSarah Twyman, RN 11/17/2014 8:32 AM  Signature:  11/17/2014 8:32 AM  Signature:  11/17/2014 8:32 AM  Signature:   11/17/2014 8:32 AM  Signature:    Signature:    Signature:    Signature:    Signature:    Signature:      Scribe for Treatment Team:   Richelle Itood Loxley Cibrian, [name]  11/17/2014 8:32 AM

## 2014-11-17 NOTE — Plan of Care (Signed)
Problem: Diagnosis: Increased Risk For Suicide Attempt Goal: STG-Patient Will Comply With Medication Regime Outcome: Completed/Met Date Met:  11/17/14 Patient compliant and reports no adverse effects to this Probation officer.

## 2014-11-17 NOTE — Progress Notes (Signed)
Patient ID: [name], male   DOB: 1990/10/06, 24 y.o.   MRN: [id number]  DAR: Pt. Denies SI/HI and A/V Hallucinations. Patient reports sleep last night was fair, appetite is good, energy level is normal, and concentration level is good. Patient rates his depression, anxiety, and hopelessness at 0/10 for the day. Patient does report chronic pain and received scheduled medication for this. Support and encouragement provided to the patient. Scheduled medications administered to patient's per physician's orders. Patient is receptive and cooperative with Clinical research associatewriter. Patient is seen in the milieu interacting with peers and is going to groups. Q15 minute checks are maintained for safety.

## 2014-11-17 NOTE — Progress Notes (Signed)
Adult Psychoeducational Group Note  Date:  11/17/2014 Time:  9:16 PM  Group Topic/Focus:  Wrap-Up Group:   The focus of this group is to help patients review their daily goal of treatment and discuss progress on daily workbooks.  Participation Level:  Active  Participation Quality:  Attentive  Affect:  Appropriate  Cognitive:  Appropriate  Insight: Good  Engagement in Group:  Engaged  Modes of Intervention:  Discussion  Additional Comments: Pt shared in group that his goal was to follow doctor instructions and participate in group, pt stated that his goals has been met.  Pt also share in group that he rated his day an 8 because he said that his day was bless and highly favor.  Pt took his medicines and participated in group and contributed to his wellness  Teyanna Thielman A 11/17/2014, 9:16 PM

## 2014-11-17 NOTE — Progress Notes (Signed)
[facility]  11/17/2014 3:11 PM Richard HillockChristopher L Gonzales  MRN:  [id number]   Subjective:  I'm ok,I want to go home soon.'  Objective: Patient seen chart reviewed.  Patient presented after he called 911 with SI and plan to walk in traffic. Patient today with improvement in his anxiety as well as paranoia. Patient denies any medication side effects. Patient denies new concerns. Per staff patient seen as restless at times. Patient compliant on medications , denies side effects.    Principal Problem: Schizophrenia Diagnosis:   Patient Active Problem List   Diagnosis Date Noted  . Cannabis use disorder, moderate, dependence [F12.20] 11/17/2014  . Schizophrenia [F20.9] 03/01/2014   Total Time spent with patient: 30 minutes   Past Medical History:  Past Medical History  Diagnosis Date  . Schizophrenia   . Hypothyroidism   . Hypercholesterolemia   . Hypertension   . Depression     Past Surgical History  Procedure Laterality Date  . Tonsillectomy    . Cardiac surgery      for Epstein's abnormality at 24yo   Family History: History reviewed. No pertinent family history. Social History:  History  Alcohol Use No    Comment: occ.     History  Drug Use  . Yes  . Special: Marijuana    Comment: occ- last used 10/31/14    History   Social History  . Marital Status: Single    Spouse Name: N/A  . Number of Children: N/A  . Years of Education: N/A   Social History Main Topics  . Smoking status: Current Every Day Smoker -- 1.00 packs/day    Types: Cigarettes  . Smokeless tobacco: Not on file  . Alcohol Use: No     Comment: occ.  . Drug Use: Yes    Special: Marijuana     Comment: occ- last used 10/31/14  . Sexual Activity: No   Other Topics Concern  . None   Social History Narrative   Additional History:    Sleep: Poor  Appetite:  Fair     Musculoskeletal: Strength & Muscle Tone: within normal limits Gait & Station: normal Patient leans:  N/A   Psychiatric Specialty Exam: Physical Exam  Review of Systems  Psychiatric/Behavioral: Positive for depression and substance abuse. The patient is nervous/anxious.     Blood pressure 155/81, pulse 104, temperature 97.8 F (36.6 C), temperature source Oral, resp. rate 18, height 5\' 10"  (1.778 m), weight 108.863 kg (240 lb), SpO2 98 %.Body mass index is 34.44 kg/(m^2).  General Appearance: Guarded  Eye Contact::  Fair  Speech:  Slow  Volume:  Normal  Mood:  Anxious  Affect:  Labile  Thought Process:  Goal Directed  Orientation:  Full (Time, Place, and Person)  Thought Content:  Paranoid Ideation and Rumination IMPROVING  Suicidal Thoughts:  No  Homicidal Thoughts:  No  Memory:  Immediate;   Fair Recent;   Fair Remote;   Fair  Judgement:  Intact  Insight:  Shallow  Psychomotor Activity:  Increased  Concentration:  Fair  Recall:  FiservFair  Fund of Knowledge:Fair  Language: Fair  Akathisia:  No  Handed:  Right  AIMS (if indicated):     Assets:  Communication Skills Desire for Improvement  ADL's:  Intact  Cognition: WNL  Sleep:  Number of Hours: 6.75     Current Medications: Current Facility-Administered Medications  Medication Dose Route Frequency Provider Last Rate Last Dose  . acetaminophen (TYLENOL) tablet 650 mg  650  mg Oral Q6H PRN Fransisca Kaufmann, NP   650 mg at 11/16/14 4098  . alum & mag hydroxide-simeth (MAALOX/MYLANTA) 200-200-20 MG/5ML suspension 30 mL  30 mL Oral Q4H PRN Fransisca Kaufmann, NP      . benztropine (COGENTIN) tablet 1 mg  1 mg Oral BID Cleotis Nipper, MD   1 mg at 11/17/14 0840  . busPIRone (BUSPAR) tablet 15 mg  15 mg Oral TID Cleotis Nipper, MD   15 mg at 11/17/14 1208  . divalproex (DEPAKOTE) DR tablet 2,000 mg  2,000 mg Oral QHS Cleotis Nipper, MD   2,000 mg at 11/16/14 2123  . feeding supplement (ENSURE COMPLETE) (ENSURE COMPLETE) liquid 237 mL  237 mL Oral BID BM Tilda Franco, RD   237 mL at 11/17/14 1435  . fenofibrate tablet 160 mg  160 mg Oral q  morning - 10a Cleotis Nipper, MD   160 mg at 11/17/14 1033  . FLUoxetine (PROZAC) capsule 60 mg  60 mg Oral q morning - 10a Cleotis Nipper, MD   60 mg at 11/17/14 1033  . haloperidol (HALDOL) tablet 10 mg  10 mg Oral BID Cleotis Nipper, MD   10 mg at 11/17/14 0840  . haloperidol decanoate (HALDOL DECANOATE) 100 MG/ML injection 100 mg  100 mg Intramuscular Q28 days Cleotis Nipper, MD   100 mg at 11/15/14 1343  . hydrOXYzine (ATARAX/VISTARIL) tablet 25 mg  25 mg Oral Q6H PRN Fransisca Kaufmann, NP   25 mg at 11/15/14 1304  . levothyroxine (SYNTHROID, LEVOTHROID) tablet 100 mcg  100 mcg Oral QAC breakfast Cleotis Nipper, MD   100 mcg at 11/17/14 1191  . magnesium hydroxide (MILK OF MAGNESIA) suspension 30 mL  30 mL Oral Daily PRN Fransisca Kaufmann, NP      . naproxen (NAPROSYN) tablet 500 mg  500 mg Oral BID WC Jomarie Longs, MD   500 mg at 11/17/14 0840  . OLANZapine zydis (ZYPREXA) disintegrating tablet 5 mg  5 mg Oral Q8H PRN Fransisca Kaufmann, NP   5 mg at 11/15/14 1304  . simvastatin (ZOCOR) tablet 20 mg  20 mg Oral QHS Cleotis Nipper, MD   20 mg at 11/16/14 2124  . traZODone (DESYREL) tablet 50 mg  50 mg Oral QHS PRN Fransisca Kaufmann, NP        Lab Results: No results found for this or any previous visit (from the past 48 hour(s)).  Physical Findings: AIMS: Facial and Oral Movements Muscles of Facial Expression: None, normal Lips and Perioral Area: None, normal Jaw: None, normal Tongue: None, normal,Extremity Movements Upper (arms, wrists, hands, fingers): None, normal Lower (legs, knees, ankles, toes): None, normal, Trunk Movements Neck, shoulders, hips: None, normal, Overall Severity Severity of abnormal movements (highest score from questions above): None, normal Incapacitation due to abnormal movements: None, normal Patient's awareness of abnormal movements (rate only patient's report): No Awareness, Dental Status Current problems with teeth and/or dentures?: No Does patient usually wear dentures?: No  CIWA:     COWS:      Assessment: Patient with hx of schizophrenia, presented with SI with plan to walk in traffic. Patient continues to be restless , reports improvemnt in his sx. Will continue current medications. Per EHR - received Haldol Decanoate on 11/15/14.    Treatment Plan Summary: Daily contact with patient to assess and evaluate symptoms and progress in treatment  Continue Prozac 60 mg daily, Haldol 10 mg twice a day, Zyprexa 5 mg as when  necessary for severe agitation.  Continue trazodone 50 mg at bedtime, BuSpar 15 mg 3 times a day, Cogentin 1 mg twice a day.   Repeat Depakote level on Tuesday.  Encouraged to participate in group milieu therapy.  Patient is in process of getting his residential situation settled.  He will need assessment from social worker before appropriate discharge planning.  At this time he does not have any side effects.  Continue to monitor his behavior and his symptoms closely.   CSW will work on disposition.  Medical Decision Making:  Review of Psycho-Social Stressors (1), Review or order clinical lab tests (1), Review and summation of old records (2), Established Problem, Worsening (2), Review of Last Therapy Session (1) and Review of Medication Regimen & Side Effects (2)     Jakaleb Payer MD 11/17/2014, 3:11 PM

## 2014-11-17 NOTE — [facility] Group Notes (Addendum)
[name]   11/17/2014 11:16 AM  Participation Quality:  Engaged  Mood/Affect:  Appropriate  Depression Rating:  denies  Anxiety Rating:  denies  Thoughts of Suicide:  No Will you contract for safety?   NA  Current AVH:  No  Plan for Discharge/Comments:  [place] this [place]; most recently was homeless.  Is torn between moving in with friends when his check comes, or going back to a GH.  Let him know if it is GH he wants, I need to start working on that today.  Said he would let me know.  He made one of his friends his payee, and I told him if he wants to stop that check, he needs to contact SSDI today.  Goal directed.    Transportation Means:  bus  Supports:  ACT team  [place], LowreyRodney Gonzales

## 2014-11-18 LAB — VALPROIC ACID LEVEL: VALPROIC ACID LVL: 61.5 ug/mL (ref 50.0–100.0)

## 2014-11-18 MED ORDER — FENOFIBRATE 160 MG PO TABS: 160.0000 mg | ORAL_TABLET | Freq: Every morning | ORAL | Status: DC

## 2014-11-18 MED ORDER — LEVOTHYROXINE SODIUM 100 MCG PO TABS: 100.0000 ug | ORAL_TABLET | Freq: Every day | ORAL | Status: DC

## 2014-11-18 MED ORDER — TRAZODONE HCL 50 MG PO TABS: 50.0000 mg | ORAL_TABLET | Freq: Every evening | ORAL | Status: DC | PRN

## 2014-11-18 MED ORDER — BUSPIRONE HCL 15 MG PO TABS: 15.0000 mg | ORAL_TABLET | Freq: Three times a day (TID) | ORAL | Status: DC

## 2014-11-18 MED ORDER — HALOPERIDOL 10 MG PO TABS: 10.0000 mg | ORAL_TABLET | Freq: Two times a day (BID) | ORAL | Status: DC

## 2014-11-18 MED ORDER — HALOPERIDOL DECANOATE 100 MG/ML IM SOLN: 100.0000 mg | INTRAMUSCULAR | Status: DC

## 2014-11-18 MED ORDER — BENZTROPINE MESYLATE 1 MG PO TABS: 1.0000 mg | ORAL_TABLET | Freq: Two times a day (BID) | ORAL | Status: DC

## 2014-11-18 MED ORDER — DIVALPROEX SODIUM 500 MG PO DR TAB
2000.0000 mg | DELAYED_RELEASE_TABLET | Freq: Every day | ORAL | Status: DC
Start: 2014-11-18 — End: 2015-07-16

## 2014-11-18 MED ORDER — SIMVASTATIN 20 MG PO TABS: 20.0000 mg | ORAL_TABLET | Freq: Every day | ORAL | Status: DC

## 2014-11-18 MED ORDER — FLUOXETINE HCL 20 MG PO CAPS: 60.0000 mg | ORAL_CAPSULE | Freq: Every morning | ORAL | Status: DC

## 2014-11-18 NOTE — [facility] Suicide Risk Assessment (Signed)
[facility] INPATIENT:  Family/Significant Other Suicide Prevention Education  Suicide Prevention Education:  Patient Refusal for Family/Significant Other Suicide Prevention Education: The patient [name] has refused to provide written consent for family/significant other to be provided Family/Significant Other Suicide Prevention Education during admission and/or prior to discharge.  Physician notified.  Daryel Geraldorth, Taisha Pennebaker B 11/18/2014, 9:58 AM

## 2014-11-18 NOTE — [facility] Suicide Risk Assessment (Signed)
[facility]   Demographic Factors:  Male and Caucasian  Total Time spent with patient: 30 minutes  Musculoskeletal: Strength & Muscle Tone: within normal limits Gait & Station: normal Patient leans: N/A  Psychiatric Specialty Exam: Physical Exam  Review of Systems  Constitutional: Negative.   HENT: Negative.   Eyes: Negative.   Respiratory: Negative.   Cardiovascular: Negative.   Gastrointestinal: Negative.   Genitourinary: Negative.   Musculoskeletal: Negative.   Skin: Negative.   Neurological: Negative.   Psychiatric/Behavioral: Negative for depression, suicidal ideas, hallucinations and substance abuse. The patient is not nervous/anxious and does not have insomnia.     Blood pressure 124/81, pulse 91, temperature 97.8 F (36.6 C), temperature source Oral, resp. rate 16, height 5\' 10"  (1.778 m), weight 108.863 kg (240 lb), SpO2 98 %.Body mass index is 34.44 kg/(m^2).  General Appearance: Casual  Eye Contact::  Fair  Speech:  Clear and Coherent409  Volume:  Normal  Mood:  Euthymic  Affect:  Congruent  Thought Process:  Goal Directed  Orientation:  Full (Time, Place, and Person)  Thought Content:  WDL  Suicidal Thoughts:  No  Homicidal Thoughts:  No  Memory:  Immediate;   Fair Recent;   Fair Remote;   Fair  Judgement:  Fair  Insight:  Fair  Psychomotor Activity:  Normal  Concentration:  Fair  Recall:  Fis[employer]Fair  Fund of Knowledge:Fair  Language: Fair  Akathisia:  No  Handed:  Right  AIMS (if indicated):     Assets:  Communication Skills Desire for Improvement Physical Health Social Support  Sleep:  Number of Hours: 6.75  Cognition: WNL  ADL's:  Intact   Have you used any form of tobacco in the last 30 days? (Cigarettes, Smokeless Tobacco, Cigars, and/or Pipes): Yes  Has this patient used any form of tobacco in the last 30 days? (Cigarettes, Smokeless Tobacco, Cigars, and/or Pipes) Yes, Prescription not provided because: patient  declined it  Mental Status Per Nursing Assessment::   On Admission:     Current Mental Status by Physician: patient denies SI/HI/AH/VH  Loss Factors: NA  Historical Factors: Impulsivity  Risk Reduction Factors:   Positive social support and Positive therapeutic relationship  Continued Clinical Symptoms:  Previous Psychiatric Diagnoses and Treatments  Cognitive Features That Contribute To Risk:  Polarized thinking    Suicide Risk:  Minimal: No identifiable suicidal ideation.  Patients presenting with no risk factors but with morbid ruminations; may be classified as minimal risk based on the severity of the depressive symptoms  Principal Problem: Schizophrenia IMPROVED Discharge Diagnoses:  Patient Active Problem List   Diagnosis Date Noted  . Cannabis use disorder, moderate, dependence [F12.20] 11/17/2014  . Schizophrenia [F20.9] 03/01/2014      Plan Of Care/Follow-up recommendations:  Activity:  No restrictions Diet:  regular Tests:  as needed Other:  follow up after care. Haldol decanoate IM q30 days as scheduled.  Is patient on multiple antipsychotic therapies at discharge:  No   Has Patient had three or more failed trials of antipsychotic monotherapy by history:  No  Recommended Plan for Multiple Antipsychotic Therapies: NA    Genessis Flanary MD 11/18/2014, 9:31 AM

## 2014-11-18 NOTE — Progress Notes (Signed)
  [facility] :  Will you be returning to the same living situation after discharge:  No. At discharge, do you have transportation home?: Yes,  friend Do you have the ability to pay for your medications: Yes,  mental health  Release of information consent forms completed and in the chart;  Patient's signature needed at discharge.  Patient to Follow up at: Follow-up Information    Follow up with [facility].   Why:  This Thursday, 3/3,  between 7:45  and 10:30 for your hospital follow up appointment.  Make sure to ask to meet with the ACT team.   Contact information:   [address]  [place]  [phone]      Patient denies SI/HI: Yes,  yes    Safety Planning and Suicide Prevention discussed: Yes,  yes  Have you used any form of tobacco in the last 30 days? (Cigarettes, Smokeless Tobacco, Cigars, and/or Pipes): Yes  Has patient been referred to the Quitline?: Patient refused referral  [name], [name] B 11/18/2014, 9:56 AM

## 2014-11-18 NOTE — Progress Notes (Signed)
Patient ID: Oswald HillockChristopher L Kivi, male   DOB: 15-Oct-1990, 24 y.o.   MRN: [id number]  Adult Psychoeducational Group Note  Date:  11/18/2014 Time: 0915  Group Topic/Focus:  Orientation:   The focus of this group is to educate the patient on the purpose and policies of crisis stabilization and provide a format to answer questions about their admission.  The group details unit policies and expectations of patients while admitted.  Participation Level:  Active  Participation Quality:  Appropriate  Affect:  Flat  Cognitive:  Appropriate and Alert  Insight: Appropriate  Engagement in Group:  Engaged  Modes of Intervention:  Activity, Discussion, Education and Support  Additional Comments: Pt engaged in group. Pt able to identify one daily goal to accomplish today.   Aurora Maskwyman, Delorese Sellin E 11/18/2014, 9:50 AM

## 2014-11-18 NOTE — Progress Notes (Signed)
Pt alert and interactive on the milieu. Pt received medications this morning and afternoon. Pt has been complaint and resting majority of the day. Pt continues to rock back and forth in chairs when sitting. Pt did attend groups and went to the cafeteria for lunch. Pt denies any SI/HI/AH/VH. Pt remains safe. Pt plans to discharge today. Pt has received discharge teaching and will receive prescriptions and medication samples upon discharge. Will continue to monitor.

## 2014-11-18 NOTE — Discharge Summary (Signed)
Physician Discharge Summary Note  Patient:  [name] is an 24 y.o., male MRN:  [id number] DOB:  11-13-1990 Patient phone:  [phone] (home)  Patient address:   [address] [place] [address],  Total Time spent with patient: 30 minutes  Date of Admission:  11/14/2014 Date of Discharge: 11/18/14  Reason for Admission:  Acute psychosis   Principal Problem: Schizophrenia Discharge Diagnoses: Patient Active Problem List   Diagnosis Date Noted  . Cannabis use disorder, moderate, dependence [F12.20] 11/17/2014  . Schizophrenia [F20.9] 03/01/2014    Musculoskeletal: Strength & Muscle Tone: within normal limits Gait & Station: normal Patient leans: N/A  Psychiatric Specialty Exam: Physical Exam  Psychiatric: His speech is normal and behavior is normal. Judgment and thought content normal. His mood appears not anxious. His affect is not angry, not blunt, not labile and not inappropriate. Cognition and memory are normal. He does not exhibit a depressed mood.    Review of Systems  Constitutional: Negative.   HENT: Negative.   Eyes: Negative.   Respiratory: Negative.   Cardiovascular: Negative.   Gastrointestinal: Negative.   Genitourinary: Negative.   Musculoskeletal: Negative.   Skin: Negative.   Neurological: Negative.   Endo/Heme/Allergies: Negative.   Psychiatric/Behavioral: Positive for depression (Stable), hallucinations (Hx of) and substance abuse (Hx Cannabis abuse). Negative for suicidal ideas and memory loss. The patient has insomnia (Stable). The patient is not nervous/anxious.     Blood pressure 124/81, pulse 91, temperature 97.8 F (36.6 C), temperature source Oral, resp. rate 16, height 5\' 10"  (1.778 m), weight 108.863 kg (240 lb), SpO2 98 %.Body mass index is 34.44 kg/(m^2).  See Physician SRA     Past Medical History:  Past Medical History  Diagnosis Date  . Schizophrenia   . Hypothyroidism   . Hypercholesterolemia   . Hypertension   .  Depression     Past Surgical History  Procedure Laterality Date  . Tonsillectomy    . Cardiac surgery      for Epstein's abnormality at 24yo   Family History: History reviewed. No pertinent family history. Social History:  History  Alcohol Use No    Comment: occ.     History  Drug Use  . Yes  . Special: Marijuana    Comment: occ- last used 10/31/14    History   Social History  . Marital Status: Single    Spouse Name: N/A  . Number of Children: N/A  . Years of Education: N/A   Social History Main Topics  . Smoking status: Current Every Day Smoker -- 1.00 packs/day    Types: Cigarettes  . Smokeless tobacco: Not on file  . Alcohol Use: No     Comment: occ.  . Drug Use: Yes    Special: Marijuana     Comment: occ- last used 10/31/14  . Sexual Activity: No   Other Topics Concern  . None   Social History Narrative    Risk to Self: Is patient at risk for suicide?: Yes What has been your use of drugs/alcohol within the last 12 months?: Pt reports using Ethol 4 weeks ago; [facility] in January 2016 and Crack cocaine October through December 2015 but has not used in last two months Risk to Others:   Prior Inpatient Therapy:   Prior Outpatient Therapy:    Level of Care:  OP  Hospital Course:  [name] is a 24 y.o. male who voluntarily presents to [facility] with SI thoughts and Depression. Pt reports the following:  pt says SI thoughts began today with a plan to "run into traffic". Pt states he's off his medications for 2 days because he cannot afford them and currently homeless for 3 days after leaving his group home ([facility]) in [place]--"got tired of living there'. Pt states he planned on moving in a home with 2 other friends but it didn't work out. He is residing in a shelter. Pt admits 1 previous SI attempt at 24 yrs old by overdosing on clonidine. Pt says he smokes 1 marijuana cigarette, every other day and his last use was 2 wks ago. Pt denies current outpatient  services, however he told this Clinical research associate that he is meeting with [facility] on 11/14/14 to help him with obtaining his medications and also has an appt with [facility] to help with housing.          [name] was admitted to the adult unit for Suicidal ideations & worsening depression related to being off of his medicines x 3 days prior. During his admission assessment, he was evaluated and his symptoms were identified. Medication management was discussed and initiated targeting his presenting symptoms. He was oriented to the unit and encouraged to participate in unit programming. His other pre-existing medical problems were identified and treated appropriately by resuming his pertinent home medication for those health issues.         While a patient in this hospital, [name] was evaluated each day by a clinical provider to ascertain patient's response to his treatment regimen. As the day goes by, improvement was noted by the patient's report of decreasing symptoms, improved sleep, appetite, affect, medication tolerance, behavior, and participation in unit programming.  He was required on daily basis to complete a self inventory asssessment noting mood, mental status, pain, new symptoms, anxiety and concerns. His symptoms responded well to his treatment regimen, being in a therapeutic and supportive environment. [name] did present with appropriate behavior & was motivated for recovery. He worked closely with the treatment team and case manager to develop a discharge plan with appropriate goals to maintain mood stability after discharge. Coping skills, problem solving as well as relaxation therapies were also part of the unit programming.  On this day of discharge, [name] was in much improved condition than upon admission. His symptoms were reported as significantly decreased or resolved completely. Upon discharge, he denies SI/HI and voiced no AVH. He was motivated to continue taking medication with a  goal of continued improvement in mental health. [name] was discharged home with a plan to follow up as noted below. He received his Haldol Injectable on 11-15-13 due to be administered again on 12-12-14. Other medications discharged on include; Buspar 15 mg for anxiety, Depakote DR 2,000 mg Q hs, Prozac 60 mg for depression, Haldol 10 mg bid, Synthroid 100 mg for hypothyroidism, Cogentin 1 mg for prevention of EPS. Consults:  psychiatry  Significant Diagnostic Studies:  labs: CBC with diff, CMP, UDS, toxicology tests, U/A, Depakote levels.  Discharge Vitals:   Blood pressure 124/81, pulse 91, temperature 97.8 F (36.6 C), temperature source Oral, resp. rate 16, height  (1.778 m), weight 108.863 kg (240 lb), SpO2 98 %. Body mass index is 34.44 kg/(m^2). Lab Results:   Results for orders placed or performed during the hospital encounter of 11/14/14 (from the past 72 hour(s))  Valproic acid level     Status: None   Collection Time: 11/18/14  6:55 AM  Result Value Ref Range   Valproic Acid Lvl 61.5 50.0 -  100.0 ug/mL    Comment: [facility]    Physical Findings: AIMS: Facial and Oral Movements Muscles of Facial Expression: None, normal Lips and Perioral Area: None, normal Jaw: None, normal Tongue: None, normal,Extremity Movements Upper (arms, wrists, hands, fingers): None, normal Lower (legs, knees, ankles, toes): None, normal, Trunk Movements Neck, shoulders, hips: None, normal, Overall Severity Severity of abnormal movements (highest score from questions above): None, normal Incapacitation due to abnormal movements: None, normal Patient's awareness of abnormal movements (rate only patient's report): No Awareness, Dental Status Current problems with teeth and/or dentures?: No Does patient usually wear dentures?: No  CIWA:    COWS:     See Psychiatric Specialty Exam and Suicide Risk Assessment completed by Attending Physician prior to  discharge.  Discharge destination:  Home  Is patient on multiple antipsychotic therapies at discharge:  No   Has Patient had three or more failed trials of antipsychotic monotherapy by history:  No  Recommended Plan for Multiple Antipsychotic Therapies: NA      Discharge Instructions    Discharge instructions    Complete by:  As directed   Please follow up with your Primary Care Provider as scheduled for further management of chronic medical problems such as underactive thyroid.            Medication List    TAKE these medications      Indication   benztropine 1 MG tablet  Commonly known as:  COGENTIN  Take 1 tablet (1 mg total) by mouth 2 (two) times daily.   Indication:  Extrapyramidal Reaction caused by Medications     busPIRone 15 MG tablet  Commonly known as:  BUSPAR  Take 1 tablet (15 mg total) by mouth 3 (three) times daily.   Indication:  Anxiety Disorder     divalproex 500 MG DR tablet  Commonly known as:  DEPAKOTE  Take 4 tablets (2,000 mg total) by mouth at bedtime.   Indication:  Mood lability     fenofibrate 160 MG tablet  Take 1 tablet (160 mg total) by mouth every morning.   Indication:  Inherited Heterozygous Hypercholesterolemia     FLUoxetine 20 MG capsule  Commonly known as:  PROZAC  Take 3 capsules (60 mg total) by mouth every morning.   Indication:  Depression     haloperidol 10 MG tablet  Commonly known as:  HALDOL  Take 1 tablet (10 mg total) by mouth 2 (two) times daily.   Indication:  Psychosis, Schizophrenia     haloperidol decanoate 100 MG/ML injection  Commonly known as:  HALDOL DECANOATE  Inject 1 mL (100 mg total) into the muscle every 28 (twenty-eight) days. Last received 11/15/14  Start taking on:  12/12/2014   Indication:  Psychosis, Schizophrenia     levothyroxine 100 MCG tablet  Commonly known as:  SYNTHROID, LEVOTHROID  Take 1 tablet (100 mcg total) by mouth daily.   Indication:  Underactive Thyroid     simvastatin 20  MG tablet  Commonly known as:  ZOCOR  Take 1 tablet (20 mg total) by mouth at bedtime.   Indication:  Cerebrovascular Accident or Stroke     traZODone 50 MG tablet  Commonly known as:  DESYREL  Take 1 tablet (50 mg total) by mouth at bedtime as needed for sleep.   Indication:  Trouble Sleeping       Follow-up Information    Follow up with [facility].   Why:  This Thursday, 3/3,  between 7:45  and 10:30 for your hospital follow up appointment.  Make sure to ask to meet with the ACT team.   Contact information:   [address]  [place]  [phone]     Follow-up recommendations:   Activity: No restrictions Diet: regular Tests: as needed Other: follow up after care. Haldol decanoate IM q 30 days as scheduled, due 12-12-14.  Comments:   Take all your medications as prescribed by your mental healthcare provider.  Report any adverse effects and or reactions from your medicines to your outpatient provider promptly.  Patient is instructed and cautioned to not engage in alcohol and or illegal drug use while on prescription medicines.  In the event of worsening symptoms, patient is instructed to call the crisis hotline, 911 and or go to the nearest ED for appropriate evaluation and treatment of symptoms.  Follow-up with your primary care provider for your other medical issues, concerns and or health care needs.   Total Discharge Time: Greater than 30 minutes  Signed: Sanjuana Kava, PMHNP, FNP-BC  11/18/2014, 6:06 PM

## 2014-11-18 NOTE — Progress Notes (Signed)
Did not attend group 

## 2014-11-18 NOTE — Progress Notes (Signed)
Pt reports he has had a good day.  He states he has been in contact with his friends, and they have found a place to rent, so he feels he is ready to go.  He states he will continue to take his medications because he knows he needs them.  He says he has been going to groups and participating.  He has been cooperative and pleasant with staff.  He makes his needs known to staff.  Support and encouragement offered.  Safety maintained with q15 minute checks.

## 2014-11-21 NOTE — Progress Notes (Signed)
Patient Discharge Instructions:  After Visit Summary (AVS):   Faxed to:  11/21/14 Discharge Summary Note:   Faxed to:  11/21/14 Psychiatric Admission Assessment Note:   Faxed to:  11/21/14 Suicide Risk Assessment - Discharge Assessment:   Faxed to:  11/21/14 Faxed/Sent to the Next Level Care provider:  11/21/14 [facility]ax[facility] to [facility] @ [phone]   Jerelene ReddenSheena E [place], 11/21/2014, 2:42 PM
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
## 2015-01-10 NOTE — Consult Note (Signed)
PATIENT NAME:  [name], NAGENGAST MR#:  [id number] DATE OF BIRTH:  1990/12/05  DATE OF CONSULTATION:  06/19/2014  REFERRING PHYSICIAN:   CONSULTING PHYSICIAN:  Audery Amel, MD  IDENTIFYING INFORMATION AND REASON FOR CONSULTATION: This is a 24 year old man with schizophrenia who came voluntarily with a chief complaint "I need to get back on my medicine."   HISTORY OF PRESENT ILLNESS: Information obtained from the patient and the chart. The patient was sent here from the homeless shelter. They found out that he had not been taking his medicine in about a month and wanted him to get back on them before they would admit him to the homeless shelter. The patient says that he stopped taking his medicine about a month ago when he left his last group home. He has been staying at the home of a friend for a few weeks. He is currently having auditory hallucinations. Mood stays mildly depressed, a little bit up and down. Sleep is erratic. Denies suicidal or homicidal ideation. Denies any new physical symptoms. Admits that he has been using marijuana on a pretty regular basis and drinks a little bit.   PAST PSYCHIATRIC HISTORY: Long history of schizophrenia, multiple psychiatric hospitalizations, distant history of suicide attempt not recent. No history of violence. Has been followed at [facility] in the past, but has a history of noncompliance. Last medications were a  combination of Haldol decanoate, oral Haldol, BuSpar, Depakote, Prozac, Cogentin, and trazodone.   PAST MEDICAL HISTORY: The patient is overweight, has dyslipidemia, hypothyroid.   SOCIAL HISTORY: Private insurance through his father. Has been followed at [facility] in the past. Tends to wander around being noncompliant.   FAMILY HISTORY: None identified.   SUBSTANCE ABUSE HISTORY: Frequent marijuana abuse, only occasional alcohol. Denies other drugs.   MENTAL STATUS EXAMINATION: Slightly disheveled gentleman who looks his stated age,  cooperative with the interview. Eye contact good. Psychomotor activity sluggish. Speech decreased in total amount. Affect flat. Mood stated as okay. Thoughts are lucid, a little bit slow. Endorses auditory hallucinations. Denies suicidal or homicidal ideation. Could recall 3 out of 3 objects immediately and at 3 minutes. Alert and oriented x 4. Judgment and insight okay.   REVIEW OF SYSTEMS:  Has some skin lesions that itch a little bit, otherwise no acute medical complaints. Positive auditory hallucinations, no suicidal or homicidal ideation. The rest of the full review of systems negative.   VITAL SIGNS: Blood pressure 182/91, respirations 20, pulse 87, temperature 98.9. Obese, able to move all extremities and ambulate without difficulty.   LABORATORY RESULTS: Drug screen positive for cannabis. Urinalysis unremarkable. Valproic acid level undetectable. TSH normal at 3.23. Salicylates slightly elevated. CBC normal. Chemistry panel unremarkable.   ASSESSMENT: A 24 year old man with schizophrenia, wants to get back on his medicines. Not acutely dangerous. Does not meet commitment criteria, does not need inpatient hospitalization.   TREATMENT PLAN: Discussed treatment plan with the patient. Unfortunately the homeless shelter has no beds right now so he does not really have a place to stay. He says that he will try and call someone and might have a place that he can state tonight, or he could just stay in the Emergency Room. Restart his medicines as prescribed previously. Psychoeducation completed.   DIAGNOSIS, PRINCIPAL AND PRIMARY:   AXIS I: Schizophrenia.   SECONDARY DIAGNOSES:  AXIS I: Marijuana abuse, no further diagnosis.     ____________________________ Audery Amel, MD jtc:bu D: 06/19/2014 17:54:27 ET T: 06/19/2014 18:32:01 ET JOB#: [id number]  cc: Audery AmelJohn T. Shanette Tamargo, MD, <Dictator> Audery AmelJOHN T Carliyah Cotterman MD ELECTRONICALLY SIGNED 06/26/2014 16:08

## 2015-01-11 NOTE — Discharge Summary (Signed)
PATIENT NAME:  [name], [name] MR#:  [id number] DATE OF BIRTH:  1990-09-21  DATE OF ADMISSION:  02/18/2012 DATE OF DISCHARGE:  03/02/2012  HOSPITAL COURSE: See dictated history and physical for details of admission. This 24 year old man was ejected from the home that he had been allowed to live in because of his psychosis and agitated behavior. He came into the hospital with disorganized agitated behavior. Chronic mental illness. Lack of a place to stay. Diagnosis was initially somewhat unclear. My observation of the patient would suggest most likely diagnosis  I believe to be schizophrenia. The patient displayed mood symptoms but also chronic disorganized thoughts, auditory hallucinations, stereotyped pacing, bizarre behavior, episodes of extended talking to himself and agitation without any target. All indicated primary thought disorder, as far as I was concerned. He was treated with antipsychotics as well as mood stabilizer and antidepressants. The patient was cooperative with treatment. He was not dangerous to anyone else or threatening while he was in the hospital. For several days he remained withdrawn and difficult to communicate with but he gradually showed improvement. Towards the end of his hospital stay, he cleaned himself up, got out of his room, participated in groups, and was able to hold lucid conversations. He was no longer pacing the halls, hitting himself on the head. We had been afraid that we were going to have to discharge him to a homeless shelter but fortunately it was worked out that we could get him into a local group home who agreed to take him pending his later disability. The patient was agreeable to this discharge plan. We also have gotten him set up with the community ACT team and will be able to follow him for chronic mental health treatment.   LABORATORY DATA: Valproic acid level on 02/28/2012 was 73. The drug screen on admission was negative. Urinalysis unremarkable. TSH  normal. Alcohol level negative. Chemistry panel all normal. CBC just showed a slightly elevated white count at 11.1.   DISCHARGE MEDICATIONS:  1. Atenolol 25 mg per day.  2. Aspirin 81 mg per day.  3. Foley folic acid did 1 mg per day.  4. Levothyroxine 0.05 mg per day.  5. Zocor 10 mg at night. 6. Depakote 500 mg at bedtime and 250 mg twice a day for a total of 250 mg in the morning and 750 mg at night.  7. Prozac 20 mg per day.  8. Cogentin 1 mg every 6 hours p.r.n.  9. Risperdal 1 mg every four hours p.r.n. for agitation.  10. Risperdal 3 mg twice a day, standing. 11. Albuterol oral inhaler p.r.n. for shortness of breath.   MENTAL STATUS EXAMINATION: Casually dressed, reasonably well groomed young man who looks his stated age or older. Cooperative with the interview. Eye contact pretty good. Psychomotor activity limited. Affect blunted. Mood stated as being okay. Thoughts are simple but not grossly disorganized. Somewhat concrete. He is still a little bit slow in his thinking. He says that he is having auditory hallucinations very infrequently and is able to ignore them more. Denies visual hallucinations. Denies suicidal or homicidal ideation. Denies feeling paranoid. Not showing grossly bizarre or disorganized behavior. Much improved insight and judgment.   DISPOSITION: Discharge to local family care home or group home. Follow-up with community ACT team.   DIAGNOSIS PRINCIPLE AND PRIMARY:   AXIS I: Schizophrenia, undifferentiated.   SECONDARY DIAGNOSES:   AXIS I: Rule out posttraumatic stress disorder.  AXIS II: Deferred.  AXIS III: Asthma, elevated triglycerides, elevated blood  pressure, hypothyroid.  AXIS IV: Severe from lack of family support, lack of financial support, burden of illness.   AXIS V: Functioning at time of discharge 50. ____________________________ Audery Amel, MD jtc:slb D: 03/02/2012 17:13:00 ET     T: 03/05/2012 09:37:38 ET        JOB#: [id number] cc: Audery Amel, MD, <Dictator> Audery Amel MD ELECTRONICALLY SIGNED 03/06/2012 15:39

## 2015-01-11 NOTE — H&P (Signed)
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  The patient is single, never married, and has been living with a church family who wanted him to leave and so he became homeless.  He came to the emergency room at [facility] with the chief complaint of "depressed, anxious, anger issues, and having suicidal thoughts".  HISTORY OF PRESENT ILLNESS:  When the patient was asked when he last felt well, he reported a lot time ago.  He had been feeling depressed about his situation and life in general.  He was last discharged from [facility] on 11/07/2011 and was recommended to go [place] [facility] [place] and an application was completed.  The patient reports that he did not go to the program because he had transportation problems.  According to the information obtained from the chart, he was unable to return to his previous living arrangements at that time and was discharged with his sister.  He could not go and live with his sister and found other church members. Currently those church members let him go and so he is essentially homeless.  He was given a follow-up appointment with Dr. Jennet MaduroPucilowska and he reports that he did not go.  He was discharged on the following medications: 1. Divalproex sodium 500 mg p.o. t.i.d. 2. Atenolol 25 mg once daily. 3. Simvastatin 20 mg at bedtime. 4. Geodon 80 mg twice daily with food. 5. Albuterol ipratropium q. 4 hours while awake.  The patient reports that he has been taking divalproex sodium and he has been taking thyroid medication, levothyroxine, folic acid, atenolol for  blood pressure, and Zocor for high cholesterol, that is, simvastatin.  The patient has not been taking Geodon because he could not get it filled as it was too expensive.  The patient reports that he has been getting his medications from Dr. Earlene [facility]aterJames Anderson at [facility] [place].  Last appointment was some time in 2012.  He gets it filled from [facility].  Next appointment is to be made.  The patient reports that he had no transportation to go and keep up his follow-up appointments.  PAST PSYCHIATRIC HISTORY:  This is the third inpatient hospitalization on psychiatry.  First inpatient hospitalization on psychiatry was in 2012 for suicidal ideas.  Second inpatient hospitalization on psychiatry was to [facility] in February 2013 when he had been struggling every day to stay calm and he was stabilized and then was discharged and did not keep up his follow-up appointments.  He had suicidal thoughts but no history of suicide attempts.  According to information obtained from the chart, the patient spent three and a half years in a group home until he aged out of an adolescent group home.  He returned briefly to his father, but he put him out very quickly.  [place] in [place] homeless shelter in [place].  He had several inpatient hospitalizations at [facility], [facility], [facility], [facility], and [facility].  This was obtained from the chart.  He  had been to wilderness camp as well.  He was given a diagnosis of dysthymia, ODD, personality disorder, mood disorder, anxiety disorder, and bipolar disorder and was treated with various psychotropic medications which include perphenazine, Depakote, lithium, Zyprexa, Lexapro, Wellbutrin, Depakote, and Abilify among other medications that he cannot remember.  In addition he was on Remeron at one time.  FAMILY HISTORY OF MENTAL ILLNESS:   Mother has schizophrenia and bipolar disorder and borderline personality disorder.  No known history of suicides in the family.  FAMILY HISTORY:  Raised by parents until they were divorced when the patient was 16 years old.  Father works for the post office.  Father is living, 71 years old.  Mother stays home and currently is in an assisted living facility because she has disability from physical problems.  Parents were divorced when he was 48 years old and he was raised by his father since then.  He has three sisters, two stepbrothers, and one stepsister.  Not close to family.  PERSONAL HISTORY:  Born at [facility].  Dropped out in the eighth grade because he "quit going to school".  He was depressed so he quit going to school.  No GED.    WORK HISTORY:  First job was at a [employer] at age 31 years.  This job lasted for six months.  He quit because it was too stressful for him and he did not make enough money and had transportation problems.  He last worked a few months ago.  MILITARY HISTORY:  Not applicable.  MARRIAGES:  Never married, never dated.  No children.  ALCOHOL AND DRUGS:  First drink of alcohol at age 69 years.  No problem with alcohol drinking. Denies street or prescription drug abuse.  No history of DWIs.  Never arrested for public drunkenness.  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  MEDICAL HISTORY:  He has high blood pressure.  No known diabetes mellitus.  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  He had surgery done at [facility] when he was [age] years.  Status post ablation for atrial fibrillation, which was done four years ago.  No history of motor vehicle accidents.  Never been unconscious.  He has high cholesterol and is on medication for the same.  He has hypertension and is on  medication for the same.  Hypothyroidism probably secondary to Lithium usage in the past.  Chronic bronchitis.  ALLERGIES:  No known drug allergies.    PRIMARY CARE PHYSICIAN:  Dr. Earlene Plater at [facility].  Last appointment 2012.  Next appointment to be made.  MEDICATIONS:  As stated above.  PHYSICAL EXAMINATION: VITAL SIGNS:  Temperature 97.7, pulse 76 per minute and regular, respirations 18 per minute and regular.  Blood pressure 130/70 mmHg.   HEENT:  Head is normocephalic, atraumatic.  Eyes- pupils equal, round, reactive to light and accommodation.  Fundi bilaterally benign.  NECK:  Supple without any thyromegaly or organomegaly or lymphadenopathy.  LUNGS:  Clear to auscultation.  No dullness on percussion.  HEART:  Normal S1, S2 without any murmurs or gallops.  ABDOMEN:  Soft, nontender.  Bowel sounds heard.  MUSCULOSKELETAL:  Normal muscle strength in all extremities.   SKIN:  No rashes or bruises.  No cyanosis.  LYMPHATIC:  No  cervical lymphadenopathy.  NEUROLOGIC:  Gait is normal. Romberg is negative.  Cranial nerves II-XII grossly intact and normal.  DTRs 2+ and normal.  Plantars are normal response.  MENTAL STATUS EXAMINATION:  Appearance and behavior: The patient is dressed in street clothes.  Alert and oriented to place, person, and time.  Fully aware of the situation that brought him for admission to [facility].  Pleasant and cooperative.  Adequately dressed and groomed.  He maintained good eye contact.  Speech is soft. Affect is flat with mood depressed.  He admits that he feels low and down and depressed and having suicidal thoughts.  However, he reports that he does not have any plans and he contracts for safety. Denies auditory or visual hallucinations. Not hearing or seeing things.  He does admit feeling paranoid and suspicious about people around as he has not been able to trust people around.  Cognition grossly intact.  Recall  and memory were good and he could remember three objects after three minutes with a little prompting and help.  He can spell the word world forward and backward.  He could count money and he knew 4 quarters, 10 dimes, 20 nickels, and 100 pennies were in a dollar.  He knew the [place] [address], capital of the [place], current Economist and previous Economist.  He does admit to sleep and appetite disturbance because he stays anxious about having no place to stay.  Insight and judgment are guarded.  IMPRESSION: AXIS I:  Mood disorder not otherwise specified.  History of social anxiety disorder.  History of ODD while growing up.  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  AXIS IV:  Severe.  Long history of mental and physical problems with being noncompliant with treatment.  Occupational, financial, housing, employment, access to care, and poor primary support.  AXIS V:  GAF 25.  PLAN:  The patient is admitted to [facility] for close observation, evaluation, and help.  He will be started back on all of the medications that he had been discharged on in February 2013.  During the stay in the hospital he will be given milieu therapy and supportive counseling.  Social services will find out about his situation and a place to go and place to stay as he had been homeless all this time and so this has to be addressed.  Transportation is a problem and so he is not able to keep appointments and social services will try to look into the same.  By the time of discharge he will not be depressed.  He will not have suicidal thoughts, which will be under control.  Appropriate follow-up appointments and medication arrangements will be made.     ____________________________ Jannet Mantis. Guss Bunde, MD skc:bjt D: 02/19/2012 15:36:59 ET T: 02/19/2012 15:42:29 ET JOB#: [id number]  cc: Monika Salk K. Guss Bunde, MD, <Dictator> Beau Fanny MD ELECTRONICALLY SIGNED 02/25/2012 18:23

## 2015-01-27 NOTE — H&P (Signed)
PATIENT NAMBurke Gonzales:  [name] [id number] OF BIRTH:  [birth year] OF ADMISSION:  11/02/2011 PHYSICIAN: Daryel NovemberJonathan Gonzales, MDPHYSICIAN:  Kristine LineaJolanta Rett Stehlik, MD DATA: [name] is an 24 year old male a history of mood instability.  COMPLAINT: "I have been struggling every day to stay calm."   OF PRESENT ILLNESS: [name] has been off medications for over 4 months. Even though he has health insurance, he has no income to pay for medications. He became increasingly restless, anxious, agitated, angry and argumentative. He complains of racing thoughts and severe insomnia for the past month. Episodes of increased energy with hyperactivity, pressured speech and racing thoughts, reported during his last hospitalization, continue. He denies any acts of aggression except punching a wall. He is not suicidal. He is worried that people are talking about him and may try to hurt him. He is vague when asked about hallucinations. He denies any problems with alcohol or drugs. He has been under considerable stress. He has been living with a [name] family since he left the [name]. Initially he was a guest there contributing $ 200 of food stamps to the household. Recently his hosts asked him to pay the rent there. He has been trying to find a job but with no skills and transportation it is not likely to happen. He does not feel that he would be going back to the same family. PSYCHIATRIC HISTORY: He was removed from home and placed in a group home four years ago due to behavioral problems. He spent 3.5 years in a group home until he "aged out" from adolescent group home. He returned briefly to his father but he put him out very quickly. He then stayed at the homeless shelter in [name]. He has had several hospital[facility] [name] [name] [name] [facility] [name]. He has been to wilderness camp as well. He was given diagnoses of dysthymia, oppositional defiant disorder,  personality disorder, mood disorder, anxiety disorder and bipolar disorder and treated with perphenazine, Depakote, lithium, Zyprexa, Lexapro, Wellbutrin, Depakote and Abilify among other medications that he does not remember. He believes that Depakote was helpful. He was discharged on a combination of Depakoe and Remeron following his last hospializaion a [name] in August 2012. HISTORY: Mother reportedly has schizophrenia, bipolar, and borderline personality disorder, otherwise negative.  MEDICAL HISTORY:  Heart surgery for Epstein anomaly.  Atrial fibrillation with ablation two years ago.  Chronic bronchitis.  Hyperlipidemia.  Hypertension. Hypothyroidism likely from Lit[place] with now normal TSH. ON ADMISSION: None. MEDICATIONS:  Atenolol 25 mg daily.  Folic acid 1 mg daily.  Hydrochlorothiazide 12.5 mg daily.  Zocor 20 mg at bedtime.  HISTORY: [place] is [place] from [place]. His family lives in [name] now. He has 7th grade education. He tried to get GED exams at [name] but was unable to attend due to problems with transportation. He smokes a half pack of cigarettes per day. No alcohol or drugs. When well, he enjoys fixing computers, listening to two-way radios, and listening to music.  OF SYSTEMS: CONSTITUTIONAL: No fevers or chills. No weight changes. EYES: He is short sided and wears glasses. ENT: No hearing loss. RESPIRATORY: Chronic bronchitis. No shortness of breath or cough. CARDIOVASCULAR: No chest pain or orthopnea. GI: No abdominal pain, nausea, vomiting, or diarrhea. GU: No incontinence or frequency. ENDOCRINE: No heat or cold intolerance. LYMPHATIC: No anemia or easy bruising. INTEGUMENT: No acne or rash. MUSCULOSKELETAL: No muscle or joint pain. NEUROLOGIC: No tingling or weakness. PSYCHIATRIC: See history  of present illness for details.  EXAMINATION: VITAL SIGNS: Blood pressure 151/89, pulse 85, respirations 18, temperature 97.4. GENERAL: This is a well-developed male in no acute distress.  HEENT: The pupils are equal, round, and reactive to light. Sclerae anicteric. NECK: Supple. No thyromegaly. LUNGS: Clear to auscultation. No dullness to percussion. HEART: Regular rhythm and rate. No murmurs, rubs, or gallops. ABDOMEN: Soft, nontender, nondistended. Positive bowel sounds. MUSCULOSKELETAL: Normal muscle strength in all extremities. SKIN: No rashes or bruises. LYMPHATIC: No cervical adenopathy. NEUROLOGIC: Cranial nerves II through XII are intact.  DIAGNOSTIC, AND RADIOLOGICAL DATA: Chemistries are within normal limits except glucose 108 and sodium 142. Blood alcohol level on admission zero. LFTs within normal limits. TSH 1.42. Urine tox screen negative for substances. CBC within normal limits. Serum acetaminophen and salicylates were low.  STATUS EXAMINATION ON ADMISSION: The patient is alert and oriented to person, place, time, and situation. He is pleasant, polite, and cooperative. He is well groomed and casually dressed. He maintains good eye contact. His speech is soft. Mood is "nervous" with flat affect. Thought processing is logical and goal oriented. Thought content: He denies suicidal or homicidal ideation. He is mildly paranoid. He does not want to discuss hallucinations. His cognition is grossly intact. He registers three out of three and recalls two out of three objects after three minutes. He can spell cat forward and backward. He does not want to do counting. He knows the current Economistresident. His abstraction is intact. His insight and judgment are fair.   RISK ASSESSMENT ON ADMISSION: This is a patient with a history of behavioral problems, mood instability and psychosis who is off medi cations who came to the ER asking for help.   I:  Mood disorder NOS.  Social anxiety disorder.  AXIS II:  Personality disorder, not otherwise specified.III:  Status post ablation for atrial fibrillation, Hyperension, Chronic bronchitis, Hypercholesterolemia. IV:  Mental and physical illness, treatment  compliance, financial, housing, employment, access to care, primary support.V:  GAF 25. The patient was admitted [facility] Behavioral Medicine unit for safety, stabilization, and medication management. He was initially placed on suicide precautions and was closely monitored for any unsafe behaviors. He underwent full psychiatric and risk assessment. He received pharmacotherapy, individual and group psychotherapy, substance abuse counseling, and support from therapeutic milieu.   1. Mood instability: We will start Depakote.   Medical: We will restart all his medications for HTN, bronchitis, dyslipidemia.  3. Disposition: TBE.   Electronic Signatures: Kristine LineaPucilowska, Annalysse Shoemaker (MD)  (Signed on 14-Feb-13 23:08)  Authored  Last Updated: 14-Feb-13 23:08 by Kristine LineaPucilowska, Katha Kuehne (MD)
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
## 2015-07-12 ENCOUNTER — Emergency Department
Admission: EM | Admit: 2015-07-12 | Discharge: 2015-07-13 | Disposition: A | Discharge status: Other Acute Inpatient Hospital | Payer: Federal, State, Local not specified - PPO | Attending: Emergency Medicine | Admitting: Emergency Medicine

## 2015-07-12 ENCOUNTER — Encounter: Payer: Self-pay | Admitting: Emergency Medicine

## 2015-07-12 DIAGNOSIS — I10 Essential (primary) hypertension: Secondary | ICD-10-CM | POA: Insufficient documentation

## 2015-07-12 DIAGNOSIS — M25551 Pain in right hip: Secondary | ICD-10-CM | POA: Diagnosis not present

## 2015-07-12 DIAGNOSIS — F209 Schizophrenia, unspecified: Secondary | ICD-10-CM | POA: Diagnosis not present

## 2015-07-12 DIAGNOSIS — Z720 Tobacco use: Secondary | ICD-10-CM | POA: Insufficient documentation

## 2015-07-12 DIAGNOSIS — F419 Anxiety disorder, unspecified: Secondary | ICD-10-CM | POA: Diagnosis not present

## 2015-07-12 DIAGNOSIS — Z79899 Other long term (current) drug therapy: Secondary | ICD-10-CM | POA: Insufficient documentation

## 2015-07-12 DIAGNOSIS — R440 Auditory hallucinations: Secondary | ICD-10-CM

## 2015-07-12 DIAGNOSIS — Z008 Encounter for other general examination: Secondary | ICD-10-CM | POA: Diagnosis present

## 2015-07-12 HISTORY — DX: Bipolar disorder, unspecified: F31.9

## 2015-07-12 LAB — COMPREHENSIVE METABOLIC PANEL
ALT: 47 U/L (ref 17–63)
AST: 31 U/L (ref 15–41)
Albumin: 4.1 g/dL (ref 3.5–5.0)
Alkaline Phosphatase: 92 U/L (ref 38–126)
Anion gap: 5 (ref 5–15)
BUN: 10 mg/dL (ref 6–20)
CHLORIDE: 110 mmol/L (ref 101–111)
CO2: 23 mmol/L (ref 22–32)
CREATININE: 0.91 mg/dL (ref 0.61–1.24)
Calcium: 9.1 mg/dL (ref 8.9–10.3)
GFR calc non Af Amer: >60 mL/min (ref >60)
Glucose, Bld: 109 mg/dL — ABNORMAL HIGH (ref 65–99)
Potassium: 3.9 mmol/L (ref 3.5–5.1)
SODIUM: 138 mmol/L (ref 135–145)
Total Bilirubin: 0.8 mg/dL (ref 0.3–1.2)
Total Protein: 6.9 g/dL (ref 6.5–8.1)

## 2015-07-12 LAB — URINE DRUG SCREEN, QUALITATIVE (ARMC ONLY)
AMPHETAMINES, UR SCREEN: NOT DETECTED
Barbiturates, Ur Screen: NOT DETECTED
Benzodiazepine, Ur Scrn: NOT DETECTED
COCAINE METABOLITE, UR ~~LOC~~: NOT DETECTED
Cannabinoid 50 Ng, Ur ~~LOC~~: POSITIVE — AB
MDMA (ECSTASY) UR SCREEN: NOT DETECTED
METHADONE SCREEN, URINE: NOT DETECTED
Opiate, Ur Screen: NOT DETECTED
Phencyclidine (PCP) Ur S: NOT DETECTED
TRICYCLIC, UR SCREEN: NOT DETECTED

## 2015-07-12 LAB — CBC
HCT: 46.1 % (ref 40.0–52.0)
HEMOGLOBIN: 15.4 g/dL (ref 13.0–18.0)
MCH: 30.7 pg (ref 26.0–34.0)
MCHC: 33.4 g/dL (ref 32.0–36.0)
MCV: 91.9 fL (ref 80.0–100.0)
Platelets: 192 10*3/uL (ref 150–440)
RBC: 5.01 MIL/uL (ref 4.40–5.90)
RDW: 13.4 % (ref 11.5–14.5)
WBC: 7 10*3/uL (ref 3.8–10.6)

## 2015-07-12 LAB — ACETAMINOPHEN LEVEL

## 2015-07-12 LAB — SALICYLATE LEVEL

## 2015-07-12 LAB — ETHANOL: Alcohol, Ethyl (B): <5 mg/dL (ref <5)

## 2015-07-12 LAB — VALPROIC ACID LEVEL: Valproic Acid Lvl: <10 ug/mL — ABNORMAL LOW (ref 50.0–100.0)

## 2015-07-12 MED ORDER — HALOPERIDOL 5 MG PO TABS
10.0000 mg | ORAL_TABLET | Freq: Two times a day (BID) | ORAL | Status: DC
  Administered 2015-07-12 – 2015-07-13 (×2): 10 mg via ORAL
  Filled 2015-07-12 (×2): qty 2

## 2015-07-12 MED ORDER — DIVALPROEX SODIUM 500 MG PO DR TAB
1000.0000 mg | DELAYED_RELEASE_TABLET | Freq: Once | ORAL | Status: AC
  Administered 2015-07-12: 1000 mg via ORAL
  Filled 2015-07-12: qty 2

## 2015-07-12 MED ORDER — LORAZEPAM 1 MG PO TABS
1.0000 mg | ORAL_TABLET | Freq: Once | ORAL | Status: AC
  Administered 2015-07-12: 1 mg via ORAL
  Filled 2015-07-12: qty 1

## 2015-07-12 NOTE — ED Notes (Signed)
Pt. Noted in room resting quietly;. No complaints or concerns voiced. No distress or abnormal behavior noted. Will continue to monitor with security cameras. Q 15 minute rounds continue. 

## 2015-07-12 NOTE — ED Notes (Signed)
Pt becoming more animated - dr aware.

## 2015-07-12 NOTE — ED Notes (Signed)
Pt. Noted in dayroom talking to himself and pacing; denies wanting to harm himself or anyone else; told RN to ignore him when you see me hearing voicing and pacing; I'm trying to control myself. No complaints or concerns voiced. No distress or abnormal behavior noted. Will continue to monitor with security cameras. Q 15 minute rounds continue.

## 2015-07-12 NOTE — ED Notes (Signed)
Pt brought into ED BHU via sally port and wand with metal detector for safety by ODS officer. Patient oriented to unit/care area: Pt informed of unit policies and procedures.  Informed that, for their safety, care areas are designed for safety and monitored by security cameras at all times; and visiting hours explained to patient. Patient verbalizes understanding, and verbal contract for safety obtained.Pt shown to their room.   ENVIRONMENTAL ASSESSMENT  Potentially harmful objects out of patient reach: Yes.  Personal belongings secured: Yes.  Patient dressed in hospital provided attire only: Yes.  Plastic bags out of patient reach: Yes.  Patient care equipment (cords, cables, call bells, lines, and drains) shortened, removed, or accounted for: Yes.  Equipment and supplies removed from bottom of stretcher: Yes.  Potentially toxic materials out of patient reach: Yes.  Sharps container removed or out of patient reach: Yes.   BEHAVIORAL HEALTH ROUNDING  Patient sleeping: No.  Patient alert and oriented: yes  Behavior appropriate: Yes. ; If no, describe:  Nutrition and fluids offered: Yes  Toileting and hygiene offered: Yes  Sitter present: not applicable  Law enforcement present: Yes ODS  
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
## 2015-07-12 NOTE — ED Notes (Signed)
Pt states he has been off his psychiatric meds for 2 weeks. Pt states he is having A/V hallucinations and depression is worsening. Pt states he was unable to get his psychiatric medications d/t change in living situation. Pt states "suicidal ideation and no plan and I don't want to hurt nobody else".

## 2015-07-12 NOTE — ED Provider Notes (Signed)
[facility] Emergency Department Provider Note  ____________________________________________  Time seen: [id number]  I have reviewed the triage vital signs and the nursing notes.   HISTORY  Chief Complaint Psychiatric Evaluation  anxiety, agitation Auditory hallucinations schizophrenia    HPI [name] is a 24 y.o. male with a history of bipolar disorder and schizophrenia who presents to the emergency department having been off his medications for the past 2 weeks. He reports he has not been able to get them refilled. A friend of his reports that he seems to be responding to voices that are not there. The patient reports that the friend noticed this yesterday. The patient is not able to differentiate between voices that are real and voices that are hallucinatory.  He reports a voices sometimes tell him to hurt himself. The patient is having thoughts of self-harm as well as.  Patient denies any chest pain, shortness of breath, abdominal pain, or headache. He does report that he has right hip pain and has had this for approximately one year. It hurts more when he walks or stands. He denies any fever.   Past Medical History  Diagnosis Date  . Schizophrenia (HCC)   . Hypothyroidism   . Hypercholesterolemia   . Hypertension   . Depression   . Bipolar disorder [facility](HCC)     Patient Active Problem List   Diagnosis Date Noted  . Cannabis use disorder, moderate, dependence (HCC) 11/17/2014  . Schizophrenia (HCC) 03/01/2014    Past Surgical History  Procedure Laterality Date  . Tonsillectomy    . Cardiac surgery      for Epstein's abnormality at 24yo    Current Outpatient Rx  Name  Route  Sig  Dispense  Refill  . benztropine (COGENTIN) 1 MG tablet   Oral   Take 1 tablet (1 mg total) by mouth 2 (two) times daily.   60 tablet   0   . busPIRone (BUSPAR) 15 MG tablet   Oral   Take 1 tablet (15 mg total) by mouth 3 (three) times daily.   90 tablet    0   . divalproex (DEPAKOTE) 500 MG DR tablet   Oral   Take 4 tablets (2,000 mg total) by mouth at bedtime.   120 tablet   0   . fenofibrate 160 MG tablet   Oral   Take 1 tablet (160 mg total) by mouth every morning.         Marland Kitchen. FLUoxetine (PROZAC) 20 MG capsule   Oral   Take 3 capsules (60 mg total) by mouth every morning.   90 capsule   0   . haloperidol (HALDOL) 10 MG tablet   Oral   Take 1 tablet (10 mg total) by mouth 2 (two) times daily.   60 tablet   0   . haloperidol decanoate (HALDOL DECANOATE) 100 MG/ML injection   Intramuscular   Inject 1 mL (100 mg total) into the muscle every 28 (twenty-eight) days. Last received 11/15/14   1 mL   0   . levothyroxine (SYNTHROID, LEVOTHROID) 100 MCG tablet   Oral   Take 1 tablet (100 mcg total) by mouth daily.         . simvastatin (ZOCOR) 20 MG tablet   Oral   Take 1 tablet (20 mg total) by mouth at bedtime.   30 tablet      . traZODone (DESYREL) 50 MG tablet   Oral   Take 1 tablet (50 mg total)  by mouth at bedtime as needed for sleep.   30 tablet   0     Allergies Review of patient's allergies indicates no known allergies.  History reviewed. No pertinent family history.  Social History Social History  Substance Use Topics  . Smoking status: Current Every Day Smoker -- 1.00 packs/day    Types: Cigarettes  . Smokeless tobacco: None  . Alcohol Use: No     Comment: occ.    Review of Systems  Constitutional: Negative for fever. ENT: Negative for sore throat. Cardiovascular: Negative for chest pain. Respiratory: Negative for cough. Gastrointestinal: Negative for abdominal pain, vomiting and diarrhea. Genitourinary: Negative for dysuria. Musculoskeletal: Pain right hip.. Skin: Negative for rash. Neurological: Negative for paresthesia or weakness Psychiatric: Patient is alert and communicative. He reports auditory hallucinations. See history of present illness  10-point ROS otherwise  negative.  ____________________________________________   PHYSICAL EXAM:  VITAL SIGNS: ED Triage Vitals  Enc Vitals Group     BP 07/12/15 1652 142/91 mmHg     Pulse Rate 07/12/15 1652 103     Resp 07/12/15 1652 18     Temp 07/12/15 1652 99.1 F (37.3 C)     Temp Source 07/12/15 1652 Oral     SpO2 07/12/15 1652 96 %     Weight 07/12/15 1652 243 lb (110.224 kg)     Height 07/12/15 1652  (1.778 m)     Head Cir --      Peak Flow --      Pain Score --      Pain Loc --      Pain Edu? --      Excl. in GC? --     Constitutional: Alert and communicative. Well appearing and in no distress. ENT   Head: Normocephalic and atraumatic.   Nose: No congestion/rhinnorhea.    Cardiovascular: Normal rate, regular rhythm, no murmur noted Respiratory:  Normal respiratory effort, no tachypnea.    Breath sounds are clear and equal bilaterally.  Gastrointestinal: Soft and nontender. No distention.  Back: No muscle spasm, no tenderness, no CVA tenderness. Musculoskeletal: No deformity noted. Nontender with normal range of motion in all extremities.  No noted edema. Neurologic:  Normal speech and language. No gross focal neurologic deficits are appreciated.  Skin:  Skin is warm, dry. No rash noted. Psychiatric: Patient appears calm. He reports his mind is racing. He reports auditory hallucinations as noted in history of present illness. He is communicative with an intact thought process.  ____________________________________________    LABS (pertinent positives/negatives)  Labs Reviewed  COMPREHENSIVE METABOLIC PANEL - Abnormal; Notable for the following:    Glucose, Bld 109 (*)    All other components within normal limits  ACETAMINOPHEN LEVEL - Abnormal; Notable for the following:    Acetaminophen (Tylenol), Serum <10 (*)    All other components within normal limits  URINE DRUG SCREEN, QUALITATIVE ([facility] ONLY) - Abnormal; Notable for the following:    Cannabinoid 50 Ng, Ur [place]  POSITIVE (*)    All other components within normal limits  ETHANOL  SALICYLATE LEVEL  CBC  VALPROIC ACID LEVEL     ____________________________________________ ____________________________________________   INITIAL IMPRESSION / ASSESSMENT AND PLAN / ED COURSE  Pertinent labs & imaging results that were available during my care of the patient were reviewed by me and considered in my medical decision making (see chart for details).  24 year old male with history of schizophrenia, currently off medications and having auditory hallucinations and some thoughts  of self-harm. He presents to emergency department voluntarily. We will restart some of his medications now, including Depakote, Haldol, and treat him with some Ativan due to the reported anxiety and racing thoughts. We will consult psychiatry to see him for further treatment.  ----------------------------------------- 12:03 AM on 07/13/2015 -----------------------------------------  We have moved the patient is behavioral health unit pending psychiatric evaluation tomorrow morning. Patient has been overall, and stable.  ____________________________________________   FINAL CLINICAL IMPRESSION(S) / ED DIAGNOSES  Final diagnoses:  Schizophrenia, unspecified type [facility])  Auditory hallucinations  Anxiety       Darien Ramus, MD 07/13/15 0003

## 2015-07-12 NOTE — ED Notes (Signed)
Urine sent to lab 

## 2015-07-13 ENCOUNTER — Encounter: Payer: Self-pay | Admitting: Psychiatry

## 2015-07-13 ENCOUNTER — Inpatient Hospital Stay
Admission: EM | Admit: 2015-07-13 | Discharge: 2015-07-16 | DRG: 885 | Disposition: A | Discharge status: Home / Self Care | Payer: Federal, State, Local not specified - PPO | Source: Intra-hospital | Attending: Psychiatry | Admitting: Psychiatry

## 2015-07-13 DIAGNOSIS — F209 Schizophrenia, unspecified: Secondary | ICD-10-CM

## 2015-07-13 DIAGNOSIS — E8881 Metabolic syndrome: Secondary | ICD-10-CM | POA: Diagnosis present

## 2015-07-13 DIAGNOSIS — F17210 Nicotine dependence, cigarettes, uncomplicated: Secondary | ICD-10-CM | POA: Diagnosis present

## 2015-07-13 DIAGNOSIS — F200 Paranoid schizophrenia: Principal | ICD-10-CM | POA: Diagnosis present

## 2015-07-13 DIAGNOSIS — Z79899 Other long term (current) drug therapy: Secondary | ICD-10-CM

## 2015-07-13 DIAGNOSIS — F410 Panic disorder [episodic paroxysmal anxiety] without agoraphobia: Secondary | ICD-10-CM | POA: Diagnosis present

## 2015-07-13 DIAGNOSIS — E785 Hyperlipidemia, unspecified: Secondary | ICD-10-CM | POA: Diagnosis present

## 2015-07-13 DIAGNOSIS — Z915 Personal history of self-harm: Secondary | ICD-10-CM

## 2015-07-13 DIAGNOSIS — J45909 Unspecified asthma, uncomplicated: Secondary | ICD-10-CM | POA: Diagnosis present

## 2015-07-13 DIAGNOSIS — F411 Generalized anxiety disorder: Secondary | ICD-10-CM | POA: Diagnosis present

## 2015-07-13 DIAGNOSIS — Z9889 Other specified postprocedural states: Secondary | ICD-10-CM

## 2015-07-13 DIAGNOSIS — E039 Hypothyroidism, unspecified: Secondary | ICD-10-CM | POA: Diagnosis present

## 2015-07-13 DIAGNOSIS — R45851 Suicidal ideations: Secondary | ICD-10-CM | POA: Diagnosis present

## 2015-07-13 DIAGNOSIS — G4700 Insomnia, unspecified: Secondary | ICD-10-CM | POA: Diagnosis present

## 2015-07-13 DIAGNOSIS — F17200 Nicotine dependence, unspecified, uncomplicated: Secondary | ICD-10-CM | POA: Insufficient documentation

## 2015-07-13 DIAGNOSIS — F1210 Cannabis abuse, uncomplicated: Secondary | ICD-10-CM | POA: Diagnosis present

## 2015-07-13 DIAGNOSIS — I10 Essential (primary) hypertension: Secondary | ICD-10-CM | POA: Diagnosis present

## 2015-07-13 DIAGNOSIS — Z818 Family history of other mental and behavioral disorders: Secondary | ICD-10-CM

## 2015-07-13 DIAGNOSIS — E7800 Pure hypercholesterolemia, unspecified: Secondary | ICD-10-CM | POA: Diagnosis present

## 2015-07-13 DIAGNOSIS — F1220 Cannabis dependence, uncomplicated: Secondary | ICD-10-CM | POA: Diagnosis present

## 2015-07-13 LAB — GLUCOSE, CAPILLARY: Glucose-Capillary: 118 mg/dL — ABNORMAL HIGH (ref 65–99)

## 2015-07-13 MED ORDER — DIVALPROEX SODIUM 500 MG PO DR TAB
500.0000 mg | DELAYED_RELEASE_TABLET | Freq: Every day | ORAL | Status: DC
  Administered 2015-07-13 – 2015-07-15 (×3): 500 mg via ORAL
  Filled 2015-07-13 (×3): qty 1

## 2015-07-13 MED ORDER — ALBUTEROL SULFATE HFA 108 (90 BASE) MCG/ACT IN AERS: 2.0000 | INHALATION_SPRAY | Freq: Four times a day (QID) | RESPIRATORY_TRACT | Status: DC | PRN

## 2015-07-13 MED ORDER — METOPROLOL TARTRATE 25 MG PO TABS
25.0000 mg | ORAL_TABLET | Freq: Two times a day (BID) | ORAL | Status: DC
  Administered 2015-07-13 – 2015-07-16 (×7): 25 mg via ORAL
  Filled 2015-07-13 (×7): qty 1

## 2015-07-13 MED ORDER — NICOTINE 21 MG/24HR TD PT24
21.0000 mg | MEDICATED_PATCH | Freq: Every day | TRANSDERMAL | Status: DC
  Filled 2015-07-13 (×2): qty 1

## 2015-07-13 MED ORDER — FLUOXETINE HCL 20 MG PO CAPS
20.0000 mg | ORAL_CAPSULE | Freq: Every day | ORAL | Status: DC
  Administered 2015-07-13 – 2015-07-14 (×2): 20 mg via ORAL
  Filled 2015-07-13 (×2): qty 1

## 2015-07-13 MED ORDER — BENZTROPINE MESYLATE 1 MG PO TABS
1.0000 mg | ORAL_TABLET | Freq: Two times a day (BID) | ORAL | Status: DC
  Administered 2015-07-13 – 2015-07-16 (×7): 1 mg via ORAL
  Filled 2015-07-13 (×7): qty 1

## 2015-07-13 MED ORDER — HALOPERIDOL DECANOATE 100 MG/ML IM SOLN
100.0000 mg | INTRAMUSCULAR | Status: DC
  Administered 2015-07-13: 100 mg via INTRAMUSCULAR
  Filled 2015-07-13: qty 1

## 2015-07-13 MED ORDER — TRAZODONE HCL 100 MG PO TABS
100.0000 mg | ORAL_TABLET | Freq: Every day | ORAL | Status: DC
  Administered 2015-07-13: 100 mg via ORAL
  Filled 2015-07-13: qty 1

## 2015-07-13 MED ORDER — ACETAMINOPHEN 325 MG PO TABS
650.0000 mg | ORAL_TABLET | Freq: Four times a day (QID) | ORAL | Status: DC | PRN
  Administered 2015-07-13 – 2015-07-14 (×2): 650 mg via ORAL
  Filled 2015-07-13 (×2): qty 2

## 2015-07-13 MED ORDER — ALUM & MAG HYDROXIDE-SIMETH 200-200-20 MG/5ML PO SUSP: 30.0000 mL | ORAL | Status: DC | PRN

## 2015-07-13 MED ORDER — DIVALPROEX SODIUM 500 MG PO DR TAB
500.0000 mg | DELAYED_RELEASE_TABLET | Freq: Three times a day (TID) | ORAL | Status: DC
  Administered 2015-07-13 – 2015-07-16 (×9): 500 mg via ORAL
  Filled 2015-07-13 (×10): qty 1

## 2015-07-13 MED ORDER — MAGNESIUM HYDROXIDE 400 MG/5ML PO SUSP: 30.0000 mL | Freq: Every day | ORAL | Status: DC | PRN

## 2015-07-13 MED ORDER — HYDROXYZINE HCL 25 MG PO TABS
25.0000 mg | ORAL_TABLET | Freq: Three times a day (TID) | ORAL | Status: DC | PRN
  Administered 2015-07-13: 25 mg via ORAL
  Filled 2015-07-13 (×2): qty 1

## 2015-07-13 MED ORDER — LEVOTHYROXINE SODIUM 100 MCG PO TABS
100.0000 ug | ORAL_TABLET | Freq: Every day | ORAL | Status: DC
Start: 2015-07-14 — End: 2015-07-16
  Administered 2015-07-14 – 2015-07-16 (×3): 100 ug via ORAL
  Filled 2015-07-13 (×4): qty 1

## 2015-07-13 NOTE — ED Notes (Signed)
BEHAVIORAL HEALTH ROUNDING Patient sleeping: No. Patient alert and oriented: yes Behavior appropriate: Yes.  ; If no, describe:  Nutrition and fluids offered: Yes  Toileting and hygiene offered: Yes  Sitter present: no Law enforcement present: Yes  

## 2015-07-13 NOTE — ED Notes (Signed)
BEHAVIORAL HEALTH ROUNDING  Patient sleeping: No.  Patient alert and oriented: yes  Behavior appropriate: Yes. ; If no, describe:  Nutrition and fluids offered: Yes  Toileting and hygiene offered: Yes  Sitter present: not applicable  Law enforcement present: Yes ODS  

## 2015-07-13 NOTE — Progress Notes (Signed)
Recreation Therapy Notes  Date: 10.24.16 Time: 3:00 pm Location: Craft Room  Group Topic: Self-expression  Goal Area(s) Addresses:  Patient will effectively use art as a mean's of self-expression. Patient will recognize positive benefit of self-expression. Patient will be able to identify one emotion experienced during group session. Patient will identify use of art/self-expression as a coping skill.  Behavioral Response: Did not attend  Intervention: Two Faces of Me  Activity: Patients were given a blank face worksheet and instructed to draw or write how they felt when they were admitted to the hospital on one side and draw or write how they want to feel when they are discharged from the hospital on the other side.  Education: LRT educated patients on other forms of self-expression.  Education Outcome: Patient did not attend group.  Clinical Observations/Feedback: Patient did not attend group.  Jacquelynn CreeGreene,Stokes Rattigan M, LRT/CTRS 07/13/2015 4:41 PM

## 2015-07-13 NOTE — ED Notes (Signed)
Pt. Noted in room resting quietly;. No complaints or concerns voiced. No distress or abnormal behavior noted. Will continue to monitor with security cameras. Q 15 minute rounds continue. 

## 2015-07-13 NOTE — ED Notes (Signed)
MD at bedside. 

## 2015-07-13 NOTE — BHH Counselor (Signed)
[facility] by Dr. Garnetta BuddyFaheem. Attending Physician will be Dr. Jennet MaduroPucilowska.  Pt. has been assigned to room 323, [facility] Charge Nurse Gigi,Rn.  Intake Paper Work has been signed and placed on pt. chart. ER staff Misty Stanley( [facility] ER MD; Legrand ComoLinda, Rn Patient's Nurse &  Patient Access) have been made aware of the admission.

## 2015-07-13 NOTE — ED Notes (Signed)
BEHAVIORAL HEALTH ROUNDING Patient sleeping: Yes.   Patient alert and oriented: not applicable SLEEPING Behavior appropriate: Yes.  ; If no, describe: SLEEPING Nutrition and fluids offered: No SLEEPING Toileting and hygiene offered: NoSLEEPING Sitter present: not applicable Law enforcement present: Yes ODS 

## 2015-07-13 NOTE — Tx Team (Signed)
Initial Interdisciplinary Treatment Plan   PATIENT STRESSORS: Medication change or noncompliance Substance abuse Traumatic event   PATIENT STRENGTHS: Communication skills Motivation for treatment/growth Physical Health   PROBLEM LIST: Problem List/Patient Goals Date to be addressed Date deferred Reason deferred Estimated date of resolution  Schizophrenia  07/13/2015     Cannabis 07/13/2015                                                DISCHARGE CRITERIA:  Improved stabilization in mood, thinking, and/or behavior Medical problems require only outpatient monitoring Motivation to continue treatment in a less acute level of care Reduction of life-threatening or endangering symptoms to within safe limits  PRELIMINARY DISCHARGE PLAN: Return to previous living arrangement  PATIENT/FAMIILY INVOLVEMENT: This treatment plan has been presented to and reviewed with the patient, [name], .  The patient and family have been given the opportunity to ask questions and make suggestions.  [name] 07/13/2015, 1:20 PM
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
## 2015-07-13 NOTE — ED Provider Notes (Signed)
-----------------------------------------   6:13 AM on 07/13/2015 -----------------------------------------   Blood pressure 135/95, pulse 82, temperature 98.9 F (37.2 C), temperature source Oral, resp. rate 18, height 5\' 10"  (1.778 m), weight 243 lb (110.224 kg), SpO2 99 %.  The patient had no acute events since last update.  Calm and cooperative at this time.  Disposition is pending per Psychiatry/Behavioral Medicine team recommendations.     Irean HongJade J Mickaela Starlin, MD 07/13/15 [phone]

## 2015-07-13 NOTE — ED Notes (Signed)
ED BHU PLACEMENT JUSTIFICATION Is the patient under IVC or is there intent for IVC: No. Is the patient medically cleared: Yes.   Is there vacancy in the ED BHU: Yes.   Is the population mix appropriate for patient: Yes.   Is the patient awaiting placement in inpatient or outpatient setting: Yes.   Has the patient had a psychiatric consult: Yes.   Survey of unit performed for contraband, proper placement and condition of furniture, tampering with fixtures in bathroom, shower, and each patient room: Yes.  ; Findings:  APPEARANCE/BEHAVIOR calm, cooperative and adequate rapport can be established NEURO ASSESSMENT Orientation: time, place and person Hallucinations: No.None noted (Hallucinations) Speech: Normal Gait: normal RESPIRATORY ASSESSMENT Normal expansion.  Clear to auscultation.  No rales, rhonchi, or wheezing., No chest wall tenderness. CARDIOVASCULAR ASSESSMENT regular rate and rhythm, S1, S2 normal, no murmur, click, rub or gallop GASTROINTESTINAL ASSESSMENT soft, nontender, BS WNL, no r/g EXTREMITIES normal strength, tone, and muscle mass PLAN OF CARE Provide calm/safe environment. Vital signs assessed twice daily. ED BHU Assessment once each 12-hour shift. Collaborate with intake RN daily or as condition indicates. Assure the ED provider has rounded once each shift. Provide and encourage hygiene. Provide redirection as needed. Assess for escalating behavior; address immediately and inform ED provider.  Assess family dynamic and appropriateness for visitation as needed: Yes.  ; If necessary, describe findings:   Educate the patient/family about BHU procedures/visitation: Yes.  ; If necessary, describe findings:

## 2015-07-13 NOTE — Consult Note (Signed)
[facility] Consult   Reason for Consult:  Depression and Suicidal Ideations  Referring Physician:  Lenise Arena, M.D  Patient Identification: [name] MRN:  [id number] Principal Diagnosis: Schizophrenia, Ch Paranoid  Diagnosis:   Patient Active Problem List   Diagnosis Date Noted  . Cannabis use disorder, moderate, dependence ([place]) [F12.20] 11/17/2014  . Schizophrenia ([place]) [F20.9] 03/01/2014    Total Time spent with patient: 1 hour  Subjective:   [name] is a 24 y.o. male patient admitted with worsening of his paranoia.  HPI:  Patient has long history of schizophrenia and reported that he started having auditory and visual hallucinations which have been present for many years. He reported that his friend was concerned about him as he was in the episode and was having suicidal thoughts. He reported that he was unable to get the refills of his current psycho topic medications as he is currently homeless and moved from the [facility]. Patient reported that he received a shot of Haldol Decanoate last month and was unable to receive his oral medications. Patient stated that he is currently homeless and lives under the house. He stated that he is currently on the combination of Risperdal Prozac Cogentin and levothyroxine. He started having suicidal ideations with a plan to kill himself. He also has been consuming marijuana on a daily basis. Patient was unable to contract for safety at this time.  Past Psychiatric History:   Long history of mental illness and has been diagnosed with schizophrenia as well as schizoaffective disorder. He was previously hospitalized last year at [facility]. He reported that he follows at [facility]. He is currently on Haldol Decanoate 100 mg IM on a monthly basis and his next injection is due on October 29  Risk to Self: Suicidal Ideation: Yes-Currently Present Suicidal Intent: No Is patient at risk  for suicide?: No Suicidal Plan?: No Access to Means: No What has been your use of drugs/alcohol within the last 12 months?: Majuanna use daily How many times?: 1 Other Self Harm Risks: Previous suicide attempt, hx of cutting Triggers for Past Attempts: Unknown Intentional Self Injurious Behavior: Cutting Comment - Self Injurious Behavior: 1 month ago Risk to Others: Homicidal Ideation: No Thoughts of Harm to Others: No Current Homicidal Intent: No Current Homicidal Plan: No Access to Homicidal Means: No Identified Victim: NA History of harm to others?: No Assessment of Violence: None Noted Violent Behavior Description: NA Does patient have access to weapons?: No Criminal Charges Pending?: No Does patient have a court date: No Prior Inpatient Therapy: Prior Inpatient Therapy: Yes Prior Therapy Dates: March Prior Therapy Facilty/Provider(s): [facility] Reason for Treatment: Psychosis Prior Outpatient Therapy: Prior Outpatient Therapy: Yes Prior Therapy Dates: "a few months ago" Prior Therapy Facilty/Provider(s): [facility] Reason for Treatment: Depression, Schizophrenia, Bipolar d/o Does patient have an ACCT team?: No Does patient have Intensive In-House Services?  : No Does patient have [place] services? :  (Previous) Does patient have P4CC services?: No  Past Medical History:  Past Medical History  Diagnosis Date  . Schizophrenia ([place])   . Hypothyroidism   . Hypercholesterolemia   . Hypertension   . Depression   . Bipolar disorder [facility])     Past Surgical History  Procedure Laterality Date  . Tonsillectomy    . Cardiac surgery      for Epstein's abnormality at 24yo   Family History: History reviewed. No pertinent family history. Family Psychiatric  History: Patient reported history of schizophrenia  and borderline personality disorder in his mother and depression in his father.  Social History:  History  Alcohol Use No    Comment: occ.     History  Drug Use  . Yes  .  Special: Marijuana    Comment: occ- last used 07/12/2015 at noon    Social History   Social History  . Marital Status: Single    Spouse Name: N/A  . Number of Children: N/A  . Years of Education: N/A   Social History Main Topics  . Smoking status: Current Every Day Smoker -- 1.00 packs/day    Types: Cigarettes  . Smokeless tobacco: None  . Alcohol Use: No     Comment: occ.  . Drug Use: Yes    Special: Marijuana     Comment: occ- last used 07/12/2015 at noon  . Sexual Activity: No   Other Topics Concern  . None   Social History Narrative   Additional Social History:    Pain Medications: None Reported Prescriptions: None Reported Over the Counter: None Reported History of alcohol / drug use?: Yes Longest period of sobriety (when/how long): 60yr"just stopped and then I picked it back up again" Negative Consequences of Use:  (None Reported) Withdrawal Symptoms: Irritability, Tremors Name of Substance 1: Majuiana 1 - Age of First Use: 19 1 - Amount (size/oz): "maybe a jonit and a bowl" 1 - Frequency: Daily 1 - Duration: since age [address]171 - Last Use / Amount: 12p 07/12/2015/ " a whole bowl, loud too"                   Allergies:  No Known Allergies  Labs:  Results for orders placed or performed during the hospital encounter of 07/12/15 (from the past 48 hour(s))  Comprehensive metabolic panel     Status: Abnormal   Collection Time: 07/12/15  4:56 PM  Result Value Ref Range   Sodium 138 135 - 145 mmol/L   Potassium 3.9 3.5 - 5.1 mmol/L   Chloride 110 101 - 111 mmol/L   CO2 23 22 - 32 mmol/L   Glucose, Bld 109 (H) 65 - 99 mg/dL   BUN 10 6 - 20 mg/dL   Creatinine, Ser 0.91 0.61 - 1.24 mg/dL   Calcium 9.1 8.9 - 10.3 mg/dL   Total Protein 6.9 6.5 - 8.1 g/dL   Albumin 4.1 3.5 - 5.0 g/dL   AST 31 15 - 41 U/L   ALT 47 17 - 63 U/L   Alkaline Phosphatase 92 38 - 126 U/L   Total Bilirubin 0.8 0.3 - 1.2 mg/dL   GFR calc non Af Amer >60 >60 mL/min   GFR calc Af Amer  >60 >60 mL/min    Comment: (NOTE) The eGFR has been calculated using the CKD EPI equation. This calculation has not been validated in all clinical situations. eGFR's persistently <60 mL/min signify possible Chronic Kidney Disease.    Anion gap 5 5 - 15  Ethanol (ETOH)     Status: None   Collection Time: 07/12/15  4:56 PM  Result Value Ref Range   Alcohol, Ethyl (B) <5 <5 mg/dL    Comment:        LOWEST DETECTABLE LIMIT FOR SERUM ALCOHOL IS 5 mg/dL FOR MEDICAL PURPOSES ONLY   Salicylate level     Status: None   Collection Time: 07/12/15  4:56 PM  Result Value Ref Range   Salicylate Lvl <<4.12.8 - 30.0 mg/dL  Acetaminophen level  Status: Abnormal   Collection Time: 07/12/15  4:56 PM  Result Value Ref Range   Acetaminophen (Tylenol), Serum <10 (L) 10 - 30 ug/mL    Comment:        THERAPEUTIC CONCENTRATIONS VARY SIGNIFICANTLY. A RANGE OF 10-30 ug/mL MAY BE AN EFFECTIVE CONCENTRATION FOR MANY PATIENTS. HOWEVER, SOME ARE BEST TREATED AT CONCENTRATIONS OUTSIDE THIS RANGE. ACETAMINOPHEN CONCENTRATIONS >150 ug/mL AT 4 HOURS AFTER INGESTION AND >50 ug/mL AT 12 HOURS AFTER INGESTION ARE OFTEN ASSOCIATED WITH TOXIC REACTIONS.   CBC     Status: None   Collection Time: 07/12/15  4:56 PM  Result Value Ref Range   WBC 7.0 3.8 - 10.6 K/uL   RBC 5.01 4.40 - 5.90 MIL/uL   Hemoglobin 15.4 13.0 - 18.0 g/dL   HCT 46.1 40.0 - 52.0 %   MCV 91.9 80.0 - 100.0 fL   MCH 30.7 26.0 - 34.0 pg   MCHC 33.4 32.0 - 36.0 g/dL   RDW 13.4 11.5 - 14.5 %   Platelets 192 150 - 440 K/uL  Urine Drug Screen, Qualitative ([facility] only)     Status: Abnormal   Collection Time: 07/12/15  4:56 PM  Result Value Ref Range   Tricyclic, Ur Screen NONE DETECTED NONE DETECTED   Amphetamines, Ur Screen NONE DETECTED NONE DETECTED   MDMA (Ecstasy)Ur Screen NONE DETECTED NONE DETECTED   Cocaine Metabolite,Ur [place] NONE DETECTED NONE DETECTED   Opiate, Ur Screen NONE DETECTED NONE DETECTED   Phencyclidine (PCP) Ur S  NONE DETECTED NONE DETECTED   Cannabinoid 50 Ng, Ur [place] POSITIVE (A) NONE DETECTED   Barbiturates, Ur Screen NONE DETECTED NONE DETECTED   Benzodiazepine, Ur Scrn NONE DETECTED NONE DETECTED   Methadone Scn, Ur NONE DETECTED NONE DETECTED    Comment: (NOTE) 088  Tricyclics, urine               Cutoff 1000 ng/mL 200  Amphetamines, urine             Cutoff 1000 ng/mL 300  MDMA (Ecstasy), urine           Cutoff 500 ng/mL 400  Cocaine Metabolite, urine       Cutoff 300 ng/mL 500  Opiate, urine                   Cutoff 300 ng/mL 600  Phencyclidine (PCP), urine      Cutoff 25 ng/mL 700  Cannabinoid, urine              Cutoff 50 ng/mL 800  Barbiturates, urine             Cutoff 200 ng/mL 900  Benzodiazepine, urine           Cutoff 200 ng/mL 1000 Methadone, urine                Cutoff 300 ng/mL 1100 1200 The urine drug screen provides only a preliminary, unconfirmed 1300 analytical test result and should not be used for non-medical 1400 purposes. Clinical consideration and professional judgment should 1500 be applied to any positive drug screen result due to possible 1600 interfering substances. A more specific alternate chemical method 1700 must be used in order to obtain a confirmed analytical result.  1800 Gas chromato graphy / mass spectrometry (GC/MS) is the preferred 1900 confirmatory method.   Valproic acid level     Status: Abnormal   Collection Time: 07/12/15  4:56 PM  Result Value Ref Range   Valproic Acid Lvl <  10 (L) 50.0 - 100.0 ug/mL    Current Facility-Administered Medications  Medication Dose Route Frequency Provider Last Rate Last Dose  . haloperidol (HALDOL) tablet 10 mg  10 mg Oral BID Ahmed Prima, MD   10 mg at 07/13/15 8413   Current Outpatient Prescriptions  Medication Sig Dispense Refill  . albuterol (PROVENTIL HFA;VENTOLIN HFA) 108 (90 BASE) MCG/ACT inhaler Inhale 1-2 puffs into the lungs every 6 (six) hours as needed for wheezing.    . benztropine  (COGENTIN) 1 MG tablet Take 1 tablet (1 mg total) by mouth 2 (two) times daily. (Patient taking differently: Take 1 mg by mouth 3 (three) times daily. ) 60 tablet 0  . divalproex (DEPAKOTE ER) 500 MG 24 hr tablet Take 500 mg by mouth every morning.    . divalproex (DEPAKOTE ER) 500 MG 24 hr tablet Take 1,500 mg by mouth at bedtime.    . fenofibrate 54 MG tablet Take 54 mg by mouth daily with breakfast.    . FLUoxetine (PROZAC) 20 MG capsule Take 3 capsules (60 mg total) by mouth every morning. 90 capsule 0  . haloperidol (HALDOL) 10 MG tablet Take 1 tablet (10 mg total) by mouth 2 (two) times daily. 60 tablet 0  . haloperidol (HALDOL) 5 MG tablet Take 5 mg by mouth 2 (two) times daily.    . haloperidol decanoate (HALDOL DECANOATE) 100 MG/ML injection Inject 1 mL (100 mg total) into the muscle every 28 (twenty-eight) days. Last received 11/15/14 (Patient taking differently: Inject 200 mg into the muscle every 28 (twenty-eight) days. ) 1 mL 0  . hydrOXYzine (ATARAX/VISTARIL) 10 MG tablet Take 10 mg by mouth every morning.    Marland Kitchen levothyroxine (SYNTHROID, LEVOTHROID) 100 MCG tablet Take 1 tablet (100 mcg total) by mouth daily. (Patient taking differently: Take 100 mcg by mouth daily before breakfast. )    . metoprolol tartrate (LOPRESSOR) 25 MG tablet Take 25 mg by mouth daily.    . traZODone (DESYREL) 50 MG tablet Take 1 tablet (50 mg total) by mouth at bedtime as needed for sleep. (Patient taking differently: Take 50 mg by mouth at bedtime. ) 30 tablet 0  . busPIRone (BUSPAR) 15 MG tablet Take 1 tablet (15 mg total) by mouth 3 (three) times daily. (Patient not taking: Reported on 07/13/2015) 90 tablet 0  . divalproex (DEPAKOTE) 500 MG DR tablet Take 4 tablets (2,000 mg total) by mouth at bedtime. (Patient not taking: Reported on 07/13/2015) 120 tablet 0  . fenofibrate 160 MG tablet Take 1 tablet (160 mg total) by mouth every morning. (Patient not taking: Reported on 07/13/2015)    . simvastatin (ZOCOR) 20  MG tablet Take 1 tablet (20 mg total) by mouth at bedtime. (Patient not taking: Reported on 07/13/2015) 30 tablet     Musculoskeletal: Strength & Muscle Tone: within normal limits Gait & Station: normal Patient leans: N/A  Psychiatric Specialty Exam: Review of Systems  Psychiatric/Behavioral: Positive for depression and hallucinations. The patient is nervous/anxious and has insomnia.     Blood pressure 126/77, pulse 83, temperature 97.8 F (36.6 C), temperature source Oral, resp. rate 20, height _0  (1.778 m), weight 243 lb (110.224 kg), SpO2 99 %.Body mass index is 34.87 kg/(m^2).  General Appearance: Disheveled  Eye Sport and exercise psychologist::  Fair  Speech:  Clear and Coherent  Volume:  Decreased  Mood:  Anxious and Depressed  Affect:  Congruent  Thought Process:  Disorganized  Orientation:  Full (Time, Place, and Person)  Thought Content:  Delusions and Hallucinations: Auditory Visual  Suicidal Thoughts:  Yes.  without intent/plan  Homicidal Thoughts:  No  Memory:  Immediate;   Fair  Judgement:  Fair  Insight:  Fair  Psychomotor Activity:  Decreased  Concentration:  Fair  Recall:  [employer] of Knowledge:Fair  Language: Fair  Akathisia:  No  Handed:  Right  AIMS (if indicated):     Assets:  Communication Skills Desire for Improvement Physical Health  ADL's:  Intact  Cognition: WNL  Sleep:  5-6   Treatment Plan Summary: Medication management  Disposition: Recommend psychiatric Inpatient admission when medically cleared. Supportive therapy provided about ongoing stressors.   Pt will be admitted to inpatient [place] [facility] for stabilization and safety. Continue with IVC and Air cabin crew.  I will continue with the medications and will adjust them to target the symptoms.  Closely monitor the adverse effects, efficacy and therapeutic response of medication. Pt will attend the group and milieu therapy.  Pt will be evaluated by the treatment team on a regular basis to  discuss treatment plan and discharge planning.  SW and other staff to help with disposition.   Thank you for allowing me to participate in care of this pt.   Rainey Pines 07/13/2015 10:33 AM

## 2015-07-13 NOTE — Progress Notes (Signed)
D: Pt denies SI/HI/AVH. Pt is pleasant and cooperative. Pt stated he felt good that he can return to his old group home in [place]. Pt denies hallucinations, but appears to possibly be responding to internal stimuli.   A: Pt was offered support and encouragement. Pt was given scheduled medications. Pt was encourage to attend groups. Q 15 minute checks were done for safety.   R:Pt attends groups and interacts well with peers and staff. Pt is taking medication. Pt receptive to treatment and safety maintained on unit.

## 2015-07-13 NOTE — Plan of Care (Signed)
Problem: Ineffective individual coping Goal: LTG: Patient will report a decrease in negative feelings Outcome: Progressing Pt felt good , pt can return to old Group home  Problem: Alteration in mood & ability to function due to Goal: LTG-Pt reports reduction in suicidal thoughts (Patient reports reduction in suicidal thoughts and is able to verbalize a safety plan for whenever patient is feeling suicidal)  Outcome: Progressing Pt denies SI at this time

## 2015-07-13 NOTE — Plan of Care (Signed)
Problem: Ineffective individual coping Goal: STG: Patient will remain free from self harm Outcome: Progressing Patient states that he is not experiencing any SI at this time.  Patient states that his depression is the same but he verbally contracted for safety.

## 2015-07-13 NOTE — BHH Counselor (Signed)
Per Dr. Faheem, Writer has admitted to [facility]. Writer faxed information to Gigi, RN.  Awaiting bed and doctor assignment.  Aariona Momon, LPCA Therapeutic Triage Specialist 

## 2015-07-14 DIAGNOSIS — F200 Paranoid schizophrenia: Principal | ICD-10-CM

## 2015-07-14 MED ORDER — LORAZEPAM 0.5 MG PO TABS
0.5000 mg | ORAL_TABLET | Freq: Three times a day (TID) | ORAL | Status: DC
  Administered 2015-07-14 – 2015-07-16 (×6): 0.5 mg via ORAL
  Filled 2015-07-14 (×6): qty 1

## 2015-07-14 MED ORDER — FLUOXETINE HCL 20 MG PO CAPS
40.0000 mg | ORAL_CAPSULE | Freq: Every day | ORAL | Status: DC
  Administered 2015-07-15 – 2015-07-16 (×2): 40 mg via ORAL
  Filled 2015-07-14 (×2): qty 2

## 2015-07-14 MED ORDER — FENOFIBRATE 54 MG PO TABS
54.0000 mg | ORAL_TABLET | Freq: Every day | ORAL | Status: DC
  Administered 2015-07-15 – 2015-07-16 (×2): 54 mg via ORAL
  Filled 2015-07-14 (×3): qty 1

## 2015-07-14 MED ORDER — TRAZODONE HCL 50 MG PO TABS
150.0000 mg | ORAL_TABLET | Freq: Every day | ORAL | Status: DC
  Administered 2015-07-14 – 2015-07-15 (×2): 150 mg via ORAL
  Filled 2015-07-14 (×2): qty 1

## 2015-07-14 MED ORDER — HALOPERIDOL 5 MG PO TABS
10.0000 mg | ORAL_TABLET | Freq: Two times a day (BID) | ORAL | Status: DC
  Administered 2015-07-14 – 2015-07-16 (×4): 10 mg via ORAL
  Filled 2015-07-14 (×4): qty 2

## 2015-07-14 NOTE — Progress Notes (Signed)
Recreation Therapy Notes  Date: 10.25.16 Time: 3:00 pm Location: Craft Room  Group Topic: Goal Setting  Goal Area(s) Addresses:  Patient will write at least one goal. Patient will write at least one obstacle.  Behavioral Response: Attentive, Interactive  Intervention: Recovery Goal Chart  Activity: Patients were instructed to make a goal chart including goals towards their recovery, obstacles, the date they started working on their goal, and the date they achieved their goal.  Education:LRT educated patients on healthy ways they can celebrate reaching their goals.  Education Outcome: Acknowledges education/In group clarification offered   Clinical Observations/Feedback: Patient completed activity by writing 3 goals and obstacles. Patient made owl sounds during group activity. LRT redirect patient. Patient complied. Patient contributed to group discussion by stating how he can celebrate achieving his goals in a healthy way.  Jacquelynn CreeGreene,Kelie Gainey M, LRT/CTRS 07/14/2015 4:39 PM

## 2015-07-14 NOTE — Progress Notes (Addendum)
Patient with sad affect, cooperative behavior with meals, meds and plan of care. No SI/HI at this time. Patient with auditory hallucinations and is able to identify them better when his environment is quiet. Anxious at times and  Good adls, good appetite. Encouraged to attend therapy groups to learn and initiate coping skills for management of stressors and diagnosis. Safety maintained.

## 2015-07-14 NOTE — BHH Suicide Risk Assessment (Signed)
[facility] Admission Suicide Risk Assessment   Nursing information obtained from:    Demographic factors:    Current Mental Status:    Loss Factors:    Historical Factors:    Risk Reduction Factors:    Total Time spent with patient: 1 hour Principal Problem: Schizophrenia, paranoid (HCC) Diagnosis:   Patient Active Problem List   Diagnosis Date Noted  . Tobacco use disorder [F17.200] 07/13/2015  . Hypothyroidism [E03.9] 07/13/2015  . Dyslipidemia [E78.5] 07/13/2015  . HTN (hypertension) [I10] 07/13/2015  . 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 Cannabis use disorder, moderate, dependence (HCC) [F12.20] 11/17/2014     Continued Clinical Symptoms:  Alcohol Use Disorder Identification Test Final Score (AUDIT): 3 The "Alcohol Use Disorders Identification Test", Guidelines for Use in Primary Care, Second Edition.  World Science writer [facility]). Score between 0-7:  no or low risk or alcohol related problems. Score between 8-15:  moderate risk of alcohol related problems. Score between 16-19:  high risk of alcohol related problems. Score 20 or above:  warrants further diagnostic evaluation for alcohol dependence and treatment.   CLINICAL FACTORS:   Schizophrenia:   Command hallucinatons Depressive state Less than 57 years old   Musculoskeletal: Strength & Muscle Tone: within normal limits Gait & Station: normal Patient leans: N/A  Psychiatric Specialty Exam: Physical Exam  Nursing note and vitals reviewed. Constitutional: He is oriented to person, place, and time. He appears well-developed and well-nourished.  HENT:  Head: Normocephalic and atraumatic.  Eyes: Conjunctivae are normal. Pupils are equal, round, and reactive to light.  Neck: Normal range of motion. Neck supple.  Cardiovascular: Normal rate, regular rhythm and normal heart sounds.   Respiratory: Effort normal and breath sounds normal.  GI: Soft. Bowel sounds are normal.  Musculoskeletal: Normal range  of motion.  Neurological: He is alert and oriented to person, place, and time.  Skin: Skin is warm and dry.    Review of Systems  Psychiatric/Behavioral: Positive for depression and hallucinations.  All other systems reviewed and are negative.   Blood pressure 124/80, pulse 76, temperature 98.2 F (36.8 C), temperature source Oral, resp. rate 18, height  (1.778 m), weight 110.224 kg (243 lb), SpO2 99 %.Body mass index is 34.87 kg/(m^2).  General Appearance: Casual  Eye Contact::  Good  Speech:  Clear and Coherent  Volume:  Normal  Mood:  Dysphoric  Affect:  Flat  Thought Process:  Goal Directed  Orientation:  Full (Time, Place, and Person)  Thought Content:  Hallucinations: Auditory Command:  Him to hurt himself  Suicidal Thoughts:  Yes.  with intent/plan  Homicidal Thoughts:  No  Memory:  Immediate;   Fair Recent;   Fair Remote;   Fair  Judgement:  Fair  Insight:  Present  Psychomotor Activity:  Normal  Concentration:  Fair  Recall:  [employer] of Knowledge:Fair  Language: Fair  Akathisia:  No  Handed:  Right  AIMS (if indicated):     Assets:  Communication Skills Desire for Improvement Financial Resources/Insurance Housing Physical Health Resilience Social Support  Sleep:  Number of Hours: 7.5  Cognition: WNL  ADL's:  Intact     COGNITIVE FEATURES THAT CONTRIBUTE TO RISK:  None    SUICIDE RISK:   Moderate:  Frequent suicidal ideation with limited intensity, and duration, some specificity in terms of plans, no associated intent, good self-control, limited dysphoria/symptomatology, some risk factors present, and identifiable protective factors, including available and accessible social support.  PLAN OF CARE: Hospital  admission, medication management, discharge planning.  Medical Decision Making:  New problem, with additional work up planned, Review of Psycho-Social Stressors (1), Review or order clinical lab tests (1), Review of Medication Regimen & Side  Effects (2) and Review of New Medication or Change in Dosage (2)   [name] is a 24 year old male with a history of schizophrenia admitted for command auditory hallucinations and suicidal ideation in the context of medication discontinuation.  1. Suicidal ideation. The patient is able to contract for safety in the hospital.  2. Psychosis. The patient has been maintained on a combination of Haldol Decanoate injections, oral Haldol, Depakote, Prozac, and Cogentin for depression, psychosis, and mood stabilization. We will restart her medications and check the Depakote level prior to discharge.  3. Anxiety. I will prescribe low-dose Ativan.  4. Asthma. Albuterol inhaler is available.  5. Hypothyroidism. Will restart Synthroid 100 g.  6. Smoking. Nicotine patch is available.  7. Dyslipidemia. Will restart fenofibrate 160 mg daily.  8. Insomnia. We will restart trazodone 150 mg.  9. Hypertension. We will restart metoprolol.  10. Disposition. The patient left [place] group home in [place] less than 30 days ago. He believes that he may return to the same group home before the end of the month. He will [facility].        I certify that inpatient services furnished can reasonably be expected to improve the patient's condition.   Dennisse Swader 07/14/2015, 4:52 PM

## 2015-07-14 NOTE — Progress Notes (Signed)
Recreation Therapy Notes  INPATIENT RECREATION THERAPY ASSESSMENT  Patient Details Name: Richard HillockChristopher L Pollick MRN: [id number] DOB: 1991-04-17 Today's Date: 07/14/2015  Patient Stressors:  Patient reported no stressors.  Coping Skills:   Isolate, Substance Abuse, Exercise, Talking, Music, Sports, Other (Comment) (Writing poetry, watching TV)  Personal Challenges: Anger, Concentration, Decision-Making, Problem-Solving, Stress Management, Substance Abuse, Trusting Others  Leisure Interests (2+):  Individual - Other (Comment) (Fixing cars, talking on 2 way radios)  Awareness of Community Resources:  Yes  Community Resources:  Park  Current Use: Yes  If no, Barriers?:    Patient Strengths:  Caring, loving  Patient Identified Areas of Improvement:  Coping skills, anger management  Current Recreation Participation:  Hanging out with friends, working on someone's computer  Patient Goal for Hospitalization:  To handle his mental illness the correct way instead of using drugs and alcohol and work on Water quality scientistanger management  [place] Residence[place]  County of Residence:     Current SI (including self-harm):  No  Current HI:  No  Consent to Intern Participation: N/A   Jacquelynn CreeGreene,Linnae Rasool M, LRT/CTRS 07/14/2015, 2:49 PM

## 2015-07-14 NOTE — H&P (Signed)
Psychiatric Admission Assessment Adult  Patient Identification: [name] MRN:  [id number] Date of Evaluation:  07/14/2015 Chief Complaint:  Schizophrenia Principal Diagnosis: Schizophrenia, paranoid (HCC) Diagnosis:   Patient Active Problem List   Diagnosis Date Noted  . Tobacco use disorder [F17.200] 07/13/2015  . Hypothyroidism [E03.9] 07/13/2015  . Dyslipidemia [E78.5] 07/13/2015  . HTN (hypertension) [I10] 07/13/2015  . 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 Cannabis use disorder, moderate, dependence (HCC) [F12.20] 11/17/2014   History of Present Illness:  Identifying data. [name] is a 24 year old male with a history of schizophrenia.  Chief complaint. "They took advantage of me."  History of present illness. Information was obtained from the patient and the chart. The patient has a long history of mental illness with his first hospitalization at the age of 10 he was relatively stable on a combination of Haldol and Depakote and Prozac when he stay at the group home [facility]. Couple weeks ago he left the group home to live independently with a group of friends. He did not have a way to refill his prescriptions and ran out of medications 2 weeks ago. He no longer wants to live with his friends. He feared feels that they took advantage of him. He wants to return to his group home which apparently is possible as long as he returns there before the end of the month. When off medication the patient started experiencing disturbing auditory command hallucinations telling him to hurt himself, making derogatory comments. He became depressed with poor sleep, decreased appetite, anhedonia, feeling of guilt and hopelessness worthlessness, poor energy and concentration social isolation heightened anxiety and exacerbation of psychotic symptoms. He came to the hospital with his friends asking for help. He denies symptoms suggestive of bipolar mania. He denies OCD or  PTSD type of symptoms. He does experience more frequent panic attacks recently. He drinks 2 beers a week if that. He is a daily marijuana smoker. He denies other illicit substance use.  Past psychiatric history. Diagnosed with mental illness at the age of 89. There were multiple psychiatric hospitalizations throughout. He reports one suicide attempt a few months ago by Depakote overdose for which she was not hospitalized. He's been tried on multiple medications. He believes that Haldol and Depakote worked best for him. He reportedly is on Haldol Decanoate 200 mg every 4 weeks in addition to oral Haldol 10 mg twice daily. Reportedly and his psychiatrist plan wean him off oral Haldol.  Family psychiatric history. Mother with schizophrenia.   Social history. He is his own guardian but his father is trying to obtain guardianship. She wants to go back to his group home in [place].  Total Time spent with patient: 1 hour  Past Psychiatric History:History of schizophrenia.  Risk to Self: Is patient at risk for suicide?: No Risk to Others:   Prior Inpatient Therapy:   Prior Outpatient Therapy:    Alcohol Screening: 1. How often do you have a drink containing alcohol?: 2 to 3 times a week 2. How many drinks containing alcohol do you have on a typical day when you are drinking?: 1 or 2 3. How often do you have six or more drinks on one occasion?: Never Preliminary Score: 0 4. How often during the last year have you found that you were not able to stop drinking once you had started?: Never 5. How often during the last year have you failed to do what was normally expected from you becasue of drinking?: Never 6. How often  during the last year have you needed a first drink in the morning to get yourself going after a heavy drinking session?: Never 7. How often during the last year have you had a feeling of guilt of remorse after drinking?: Never 8. How often during the last year have you been unable  to remember what happened the night before because you had been drinking?: Never 9. Have you or someone else been injured as a result of your drinking?: No 10. Has a relative or friend or a doctor or another health worker been concerned about your drinking or suggested you cut down?: No Alcohol Use Disorder Identification Test Final Score (AUDIT): 3 Brief Intervention: Patient declined brief intervention Substance Abuse History in the last 12 months:  Yes.   Consequences of Substance Abuse: Negative Previous Psychotropic Medications: Yes  Psychological Evaluations: No  Past Medical History:  Past Medical History  Diagnosis Date  . Schizophrenia (HCC)   . Hypothyroidism   . Hypercholesterolemia   . Hypertension   . Depression   . Bipolar disorder [facility])     Past Surgical History  Procedure Laterality Date  . Tonsillectomy    . Cardiac surgery      for Epstein's abnormality at 24yo   Family History: History reviewed. No pertinent family history. Family Psychiatric  History: Mother with schizophrenia.  Social History:  History  Alcohol Use No    Comment: occ.     History  Drug Use  . Yes  . Special: Marijuana    Comment: occ- last used 07/12/2015 at noon    Social History   Social History  . Marital Status: Single    Spouse Name: N/A  . Number of Children: N/A  . Years of Education: N/A   Social History Main Topics  . Smoking status: Current Every Day Smoker -- 1.00 packs/day    Types: Cigarettes  . Smokeless tobacco: None  . Alcohol Use: No     Comment: occ.  . Drug Use: Yes    Special: Marijuana     Comment: occ- last used 07/12/2015 at noon  . Sexual Activity: No   Other Topics Concern  . None   Social History Narrative   Additional Social History:                         Allergies:  No Known Allergies Lab Results:  Results for orders placed or performed during the hospital encounter of 07/13/15 (from the past 48 hour(s))  Glucose,  capillary     Status: Abnormal   Collection Time: 07/13/15  8:37 PM  Result Value Ref Range   Glucose-Capillary 118 (H) 65 - 99 mg/dL   Comment 1 Notify RN     Metabolic Disorder Labs:  Lab Results  Component Value Date   HGBA1C  04/04/2008    5.7 (NOTE)   The ADA recommends the following therapeutic goal for glycemic   control related to Hgb A1C measurement:   Goal of Therapy:   < 7.0% Hgb A1C   Reference: American Diabetes Association: Clinical Practice   Recommendations 2008, Diabetes Care,  2008, 31:(Suppl 1).   MPG 126 04/04/2008   No results found for: PROLACTIN Lab Results  Component Value Date   CHOL  04/20/2008    169        ATP III CLASSIFICATION:  <200     mg/dL   Desirable  161-096  mg/dL   Borderline High  >=045  mg/dL   High   TRIG 213184* 08/65/784608/10/2007   HDL 29* 04/20/2008   CHOLHDL 5.8 04/20/2008   VLDL 37 04/20/2008   LDLCALC  04/20/2008    103        Total Cholesterol/HDL:CHD Risk Coronary Heart Disease Risk Table                     Men   Women  1/2 Average Risk   3.4   3.3   LDLCALC  04/02/2008    48        Total Cholesterol/HDL:CHD Risk Coronary Heart Disease Risk Table                     Men   Women  1/2 Average Risk   3.4   3.3    Current Medications: Current Facility-Administered Medications  Medication Dose Route Frequency Provider Last Rate Last Dose  . acetaminophen (TYLENOL) tablet 650 mg  650 mg Oral Q6H PRN Shari ProwsJolanta B Kemiah Booz, MD   650 mg at 07/14/15 0932  . albuterol (PROVENTIL HFA;VENTOLIN HFA) 108 (90 BASE) MCG/ACT inhaler 2 puff  2 puff Inhalation Q6H PRN Malasia Torain B Broady Lafoy, MD      . alum & mag hydroxide-simeth (MAALOX/MYLANTA) 200-200-20 MG/5ML suspension 30 mL  30 mL Oral Q4H PRN Octavio Matheney B Maalik Pinn, MD      . benztropine (COGENTIN) tablet 1 mg  1 mg Oral BID Shari ProwsJolanta B Danyl Deems, MD   1 mg at 07/14/15 0933  . divalproex (DEPAKOTE) DR tablet 500 mg  500 mg Oral 3 times per day Shari ProwsJolanta B Sayuri Rhames, MD   500 mg at 07/14/15 1336   . divalproex (DEPAKOTE) DR tablet 500 mg  500 mg Oral QHS Shari ProwsJolanta B Tonjia Parillo, MD   500 mg at 07/13/15 2123  . fenofibrate tablet 54 mg  54 mg Oral Daily Shari ProwsJolanta B Mersedes Alber, MD      . Melene Muller[START ON 07/15/2015] FLUoxetine (PROZAC) capsule 40 mg  40 mg Oral Daily Boyd Litaker B Minnette Merida, MD      . haloperidol (HALDOL) tablet 10 mg  10 mg Oral BID Elizeo Rodriques B Evamae Rowen, MD      . haloperidol decanoate (HALDOL DECANOATE) 100 MG/ML injection 100 mg  100 mg Intramuscular Q30 days Shari ProwsJolanta B Roanna Reaves, MD   100 mg at 07/13/15 1401  . hydrOXYzine (ATARAX/VISTARIL) tablet 25 mg  25 mg Oral TID PRN Shari ProwsJolanta B Alejandria Wessells, MD   25 mg at 07/13/15 1401  . levothyroxine (SYNTHROID, LEVOTHROID) tablet 100 mcg  100 mcg Oral QAC breakfast Shari ProwsJolanta B Deanthony Maull, MD   100 mcg at 07/14/15 0640  . LORazepam (ATIVAN) tablet 0.5 mg  0.5 mg Oral TID AC Saray Capasso B Medardo Hassing, MD      . magnesium hydroxide (MILK OF MAGNESIA) suspension 30 mL  30 mL Oral Daily PRN Chenoa Luddy B Yarelly Kuba, MD      . metoprolol tartrate (LOPRESSOR) tablet 25 mg  25 mg Oral BID Shari ProwsJolanta B Laurissa Cowper, MD   25 mg at 07/14/15 0933  . nicotine (NICODERM CQ - dosed in mg/24 hours) patch 21 mg  21 mg Transdermal Q0600 Severina Sykora B Tawona Filsinger, MD   21 mg at 07/14/15 0600  . traZODone (DESYREL) tablet 150 mg  150 mg Oral QHS Zane Pellecchia B Leidi Astle, MD       PTA Medications: Prescriptions prior to admission  Medication Sig Dispense Refill Last Dose  . albuterol (PROVENTIL HFA;VENTOLIN HFA) 108 (90 BASE) MCG/ACT inhaler Inhale 1-2 puffs into the lungs every  6 (six) hours as needed for wheezing.   PRN  . benztropine (COGENTIN) 1 MG tablet Take 1 tablet (1 mg total) by mouth 2 (two) times daily. (Patient taking differently: Take 1 mg by mouth 3 (three) times daily. ) 60 tablet 0 unknown  . divalproex (DEPAKOTE ER) 500 MG 24 hr tablet Take 500 mg by mouth every morning.   unknown  . divalproex (DEPAKOTE ER) 500 MG 24 hr tablet Take 1,500 mg by mouth at bedtime.   unknown   . fenofibrate 54 MG tablet Take 54 mg by mouth daily with breakfast.   unknown  . FLUoxetine (PROZAC) 20 MG capsule Take 3 capsules (60 mg total) by mouth every morning. 90 capsule 0 unknown  . haloperidol (HALDOL) 10 MG tablet Take 1 tablet (10 mg total) by mouth 2 (two) times daily. 60 tablet 0 unknown  . haloperidol (HALDOL) 5 MG tablet Take 5 mg by mouth 2 (two) times daily.   unknown  . haloperidol decanoate (HALDOL DECANOATE) 100 MG/ML injection Inject 1 mL (100 mg total) into the muscle every 28 (twenty-eight) days. Last received 11/15/14 (Patient taking differently: Inject 200 mg into the muscle every 28 (twenty-eight) days. ) 1 mL 0 unknown  . hydrOXYzine (ATARAX/VISTARIL) 10 MG tablet Take 10 mg by mouth every morning.   unknown  . levothyroxine (SYNTHROID, LEVOTHROID) 100 MCG tablet Take 1 tablet (100 mcg total) by mouth daily. (Patient taking differently: Take 100 mcg by mouth daily before breakfast. )   unknown  . metoprolol tartrate (LOPRESSOR) 25 MG tablet Take 25 mg by mouth daily.   unknown  . traZODone (DESYREL) 50 MG tablet Take 1 tablet (50 mg total) by mouth at bedtime as needed for sleep. (Patient taking differently: Take 50 mg by mouth at bedtime. ) 30 tablet 0 unknown  . busPIRone (BUSPAR) 15 MG tablet Take 1 tablet (15 mg total) by mouth 3 (three) times daily. (Patient not taking: Reported on 07/13/2015) 90 tablet 0   . divalproex (DEPAKOTE) 500 MG DR tablet Take 4 tablets (2,000 mg total) by mouth at bedtime. (Patient not taking: Reported on 07/13/2015) 120 tablet 0   . fenofibrate 160 MG tablet Take 1 tablet (160 mg total) by mouth every morning. (Patient not taking: Reported on 07/13/2015)     . simvastatin (ZOCOR) 20 MG tablet Take 1 tablet (20 mg total) by mouth at bedtime. (Patient not taking: Reported on 07/13/2015) 30 tablet      Musculoskeletal: Strength & Muscle Tone: within normal limits Gait & Station: normal Patient leans: N/A  Psychiatric Specialty  Exam: Physical Exam  Nursing note and vitals reviewed.   Review of Systems  Psychiatric/Behavioral: Positive for depression, suicidal ideas and hallucinations.  All other systems reviewed and are negative.   Blood pressure 124/80, pulse 76, temperature 98.2 F (36.8 C), temperature source Oral, resp. rate 18, height  (1.778 m), weight 110.224 kg (243 lb), SpO2 99 %.Body mass index is 34.87 kg/(m^2).  See SRA.                                                  Sleep:  Number of Hours: 7.5     Treatment Plan Summary: Daily contact with patient to assess and evaluate symptoms and progress in treatment and Medication management   Mr. Rosensteel is a 24 year old male with  a history of schizophrenia admitted for command auditory hallucinations and suicidal ideation in the context of medication discontinuation.  1. Suicidal ideation. The patient is able to contract for safety in the hospital.  2. Psychosis. The patient has been maintained on a combination of Haldol Decanoate injections, oral Haldol, Depakote, Prozac, and Cogentin for depression, psychosis, and mood stabilization. We will restart her medications and check the Depakote level prior to discharge.  3. Anxiety. I will prescribe low-dose Ativan.  4. Asthma. Albuterol inhaler is available.  5. Hypothyroidism. Will restart Synthroid 100 g.  6. Smoking. Nicotine patch is available.  7. Dyslipidemia. Will restart fenofibrate 160 mg daily.  8. Insomnia. We will restart trazodone 150 mg.  9. Hypertension. We will restart metoprolol.  10. Cannabis abuse. The patient is a daily marijuana smoker but wants to stop. He is not interested in residential treatment rather he will follow-up with his regular psychiatrist.   11. Disposition. The patient left his group home in [facility] less than 30 days ago. He believes that he may return to the same group home before the end of the month. He will follow-up with  his regular provider at [facility].    Observation Level/Precautions:  15 minute checks  Laboratory:  CBC Chemistry Profile UDS UA  Psychotherapy:    Medications:    Consultations:    Discharge Concerns:    Estimated LOS:  Other:     I certify that inpatient services furnished can reasonably be expected to improve the patient's condition.   Racheal Mathurin 10/25/20164:59 PM

## 2015-07-15 MED ORDER — ZOLPIDEM TARTRATE 5 MG PO TABS: 10.0000 mg | ORAL_TABLET | Freq: Every evening | ORAL | Status: DC | PRN

## 2015-07-15 NOTE — [facility] Group Notes (Signed)
[facility] Group Notes:  (Nursing/MHT/Case Management/Adjunct)  Date:  07/15/2015  Time:  2:19 PM  Type of Therapy:  Psychoeducational Skills  Participation Level:  Active  Participation Quality:  Appropriate, Attentive and Sharing  Affect:  Appropriate  Cognitive:  Alert and Appropriate  Insight:  Appropriate  Engagement in Group:  Engaged  Modes of Intervention:  Discussion, Education and Support  Summary of Progress/Problems:  Lynelle SmokeCara Travis Gera Inboden 07/15/2015, 2:19 PM

## 2015-07-15 NOTE — Progress Notes (Signed)
Recreation Therapy Notes  Date: 10.26.16 Time: 3:00 pm Location: Craft Room  Group Topic: Self-esteem  Goal Area(s) Addresses:  Patient will write at least one positive trait. Patient will verbalize benefit of having a healthy self-esteem.  Behavioral Response: Did not attend  Intervention: I Am  Activity: Patients were given a worksheet with the letter I on it and instructed to fill the letter with as many positive traits about themselves as they could.  Education:LRT educated patients on ways they can increase their self-esteem.   Education Outcome: Patient did not attend group.   Clinical Observations/Feedback: Patient did not attend group.  Moncia Annas M, LRT/CTRS 07/15/2015 4:38 PM 

## 2015-07-15 NOTE — [facility] Group Notes (Signed)
[facility] [name] Group Therapy  07/15/2015 3:02 PM  Type of Therapy:  Group Therapy  Participation Level:  Active  Participation Quality:  Appropriate and Attentive  Affect:  Appropriate  Cognitive:  Alert, Appropriate and Oriented  Insight:  Engaged  Engagement in Therapy:  Engaged  Modes of Intervention:  Discussion, Socialization and Support  Summary of Progress/Problems: Patient attended and participated in group discussion appropriately. 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 Patient shared that he has struggled with anger and fear in the past but listens to his friends and family when he is spiraling out of control. Patient states he is aware of his need to stay on his meds but states he ran out of money to get his meds which caused him to be hospitalized this time.  Lulu RidingIngle, Man Effertz T, [name], LCSWA 07/15/2015, 3:02 PM

## 2015-07-15 NOTE — [facility] Group Notes (Signed)
[name]   07/15/2015 12:27 PM  Participation Quality:  Active  Mood/Affect:  Appropriate  Thoughts of Suicide:  NA Will you contract for safety?   NA  Current AVH:  NA  Plan for Discharge/Comments:  Patient attended group and participated appropriately. Patient reported that his goal is to manage his mental illness by utilizing coping skills "talking to positive people and family".   Transportation Means: Patient was informed about different transportation resources and encouraged to speak to CSW if a referral is needed.  Supports: Patient was encouraged to speak with CSW for community resources, if needed.  Jenel LucksJasmine Lewis, Clinical Social Work Intern 07/15/15  Beryl MeagerJason Keelia Graybill, [name],  Theresia MajorsLCSWA  07/15/15
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
## 2015-07-15 NOTE — Plan of Care (Signed)
Problem: [name] Participation in Recreation Therapeutic Interventions Goal: STG-Patient will identify at least five coping skills for ** STG: Coping Skills - Within 4 treatment sessions, patient will verbalize at least 5 coping skills for anger in each of 2 treatment sessions to increase anger management skills post d/c.  Outcome: Progressing Treatment Session 1; Completed 1 out of 2; AT approximately 12:45 pm, LRT met with patient in craft room. Patient verbalized 5 coping skills for anger. Patient verbalized what triggers him to get angry, and how his body responds to anger. LRT provided suggestions on how patient can remember to use his healthy coping skills.  [facility], LRT/CTRS 10.26.16 5:03 pm Goal: STG-Other Recreation Therapy Goal (Specify) STG: Stress Management - Within 4 treatment sessions, patient will verbalize understanding of the stress management techniques in each of 2 treatment sessions to increase stress management skills post d/c.  Outcome: Progressing Treatment Session 1; Completed 1 out of 2: At approximately 12:45 pm, LRT met with patient in craft room. LRT educated and provided patient with handouts on stress management techniques. Patient verbalized understanding. LRT encouraged patient to read over and practice them.  [facility], LRT/CTRS 10.26.16 5:07 pm  Problem: [facility] Participation in Recreation Therapeutic Interventions Goal: STG-Patient will identify at least five coping skills for ** STG: Coping Skills - Within 4 treatment sessions, patient will verbalize at least 5 coping skills for substance abuse in each of 2 treatment sessions to decrease substance abuse post d/c.  Outcome: Progressing Treatment Session 1; Completed 1 out of 2: At approximately 12:45 pm, LRT met with patient in craft room. Patient verbalized 5 coping skills for substance abuse. LRT educated patient on leisure and why it is important to implement it into his schedule. LRT educated  and provided patient with blank schedules to help him plan his day and try to avoid using substances. LRT educated patient on healthy support systems.  [facility], LRT/CTRS 10.26.16 5:07 pm

## 2015-07-15 NOTE — Progress Notes (Signed)
D: Patient denies depression or SI. Denies AVH  Verbalizes that he was off his medications and needed to get back on his meds. A: Support and encouragement offered.  Medications given according to orders.  Safety maintained.    R:  Compliant with treatment plan.

## 2015-07-15 NOTE — Plan of Care (Signed)
Problem: Alteration in mood Goal: LTG-Patient's behavior demonstrates decreased manic symptoms (Patient's behavior demonstrates decreased manic symptoms to the point the patient is safe to return home and continue treatment in an outpatient setting)  Outcome: Progressing Pleasant and cooperative.

## 2015-07-15 NOTE — Progress Notes (Signed)
Denied SI/HI, denied AV/H, allowed to vent about the disappointments from friends. Sociable, organized in thoughts processing and contents.

## 2015-07-15 NOTE — Plan of Care (Signed)
Problem: Ineffective individual coping Goal: STG: Patient will remain free from self harm Outcome: Progressing Medications administered as ordere by the physician except Nicotine Patch, medications Therapeutic Effects, SEs and Adverse effects discussed, questions encouraged; no PRN given, 15 minute checks maintained for safety, clinical and moral support provided, patient encouraged to continue to express feelings and demonstrate safe care. Patient remain free from harm, will continue to monitor.

## 2015-07-16 LAB — LIPID PANEL
Cholesterol: 163 mg/dL (ref 0–200)
HDL: 30 mg/dL — AB (ref >40)
LDL CALC: 71 mg/dL (ref 0–99)
TRIGLYCERIDES: 312 mg/dL — AB (ref <150)
Total CHOL/HDL Ratio: 5.4 RATIO
VLDL: 62 mg/dL — ABNORMAL HIGH (ref 0–40)

## 2015-07-16 LAB — TSH: TSH: 3.544 u[IU]/mL (ref 0.350–4.500)

## 2015-07-16 LAB — HEMOGLOBIN A1C: Hgb A1c MFr Bld: 5.3 % (ref 4.0–6.0)

## 2015-07-16 LAB — VALPROIC ACID LEVEL: Valproic Acid Lvl: 91 ug/mL (ref 50.0–100.0)

## 2015-07-16 MED ORDER — LEVOTHYROXINE SODIUM 100 MCG PO TABS: 100.0000 ug | ORAL_TABLET | Freq: Every day | ORAL | Status: AC

## 2015-07-16 MED ORDER — FENOFIBRATE 160 MG PO TABS: 160.0000 mg | ORAL_TABLET | Freq: Every morning | ORAL | Status: AC

## 2015-07-16 MED ORDER — DIVALPROEX SODIUM 500 MG PO DR TAB: 2000.0000 mg | DELAYED_RELEASE_TABLET | Freq: Every day | ORAL | Status: AC

## 2015-07-16 MED ORDER — FLUOXETINE HCL 20 MG PO CAPS: 60.0000 mg | ORAL_CAPSULE | Freq: Every morning | ORAL | Status: AC

## 2015-07-16 MED ORDER — BENZTROPINE MESYLATE 1 MG PO TABS: 1.0000 mg | ORAL_TABLET | Freq: Three times a day (TID) | ORAL | Status: AC

## 2015-07-16 MED ORDER — HALOPERIDOL 5 MG PO TABS: 5.0000 mg | ORAL_TABLET | Freq: Two times a day (BID) | ORAL | Status: AC

## 2015-07-16 MED ORDER — LORAZEPAM 0.5 MG PO TABS: 0.5000 mg | ORAL_TABLET | Freq: Three times a day (TID) | ORAL | Status: AC

## 2015-07-16 MED ORDER — METOPROLOL TARTRATE 25 MG PO TABS: 25.0000 mg | ORAL_TABLET | Freq: Every day | ORAL | Status: AC

## 2015-07-16 MED ORDER — ALBUTEROL SULFATE HFA 108 (90 BASE) MCG/ACT IN AERS: 2.0000 | INHALATION_SPRAY | Freq: Four times a day (QID) | RESPIRATORY_TRACT | Status: AC | PRN

## 2015-07-16 MED ORDER — HALOPERIDOL DECANOATE 100 MG/ML IM SOLN: 200.0000 mg | INTRAMUSCULAR | Status: AC

## 2015-07-16 MED ORDER — HALOPERIDOL DECANOATE 100 MG/ML IM SOLN
100.0000 mg | Freq: Once | INTRAMUSCULAR | Status: DC
  Filled 2015-07-16: qty 1

## 2015-07-16 MED ORDER — TRAZODONE HCL 150 MG PO TABS: 150.0000 mg | ORAL_TABLET | Freq: Every day | ORAL | Status: AC

## 2015-07-16 MED ORDER — HYDROXYZINE HCL 25 MG PO TABS: 25.0000 mg | ORAL_TABLET | Freq: Three times a day (TID) | ORAL | Status: AC

## 2015-07-16 NOTE — Progress Notes (Signed)
  [facility]  Will you be returning to the same living situation after discharge:  Yes,  Group home At discharge, do you have transportation home?: Yes,  father  Do you have the ability to pay for your medications: Yes,  Insurance   Release of information consent forms completed and in the chart;  Patient's signature needed at discharge.  Patient to Follow up at: Follow-up Information    Follow up with [facility]  In 1 week.   Why:  Please go to the walk in clinic for your hospital follow up appointment. Walk in hours: Monday - Friday 8:00am-4:00pm. Please take any and all hospital paper work.    Contact information:   [address],  [place] [address] Phone: [phone]      Patient denies SI/HI: Yes,  Yes    Safety Planning and Suicide Prevention discussed: Yes,  With patient and father   Have you used any form of tobacco in the last 30 days? (Cigarettes, Smokeless Tobacco, Cigars, and/or Pipes): Yes  Has patient been referred to the Quitline?: Patient refused referral  Rondall AllegraCandace L Kathyleen Radice  [name], LCSWA   07/16/2015, 1:18 PM
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
## 2015-07-16 NOTE — [place] FL2 (Signed)
[place] MEDICAID FL2 LEVEL OF CARE SCREENING TOOL     IDENTIFICATION  Patient Name: [name] Birthdate: 05-Jul-1991 Sex: male Admission Date (Current Location): 07/13/2015  [place] and [place] Number: Administrator and Address:  [facility] [address], [place], [place] [address]      Provider Number: [id number]  Attending Physician Name and Address:  Shari Prows, MD  Relative Name and Phone Number:  Regis Hinton [phone]    Current Level of Care: Hospital Recommended Level of Care: Family Care Home Prior Approval Number:    Date Approved/Denied:   PASRR Number:    Discharge Plan: Other (Comment) (Group home)    Current Diagnoses: Patient Active Problem List   Diagnosis Date Noted  . Tobacco use disorder 07/13/2015  . Hypothyroidism 07/13/2015  . Dyslipidemia 07/13/2015  . HTN (hypertension) 07/13/2015  . Schizophrenia, paranoid (HCC) 07/13/2015  . Cannabis use disorder, moderate, dependence (HCC) 11/17/2014    Orientation ACTIVITIES/SOCIAL BLADDER RESPIRATION    Time, Self, Situation, Place  Passive, Family supportive Continent Normal  BEHAVIORAL SYMPTOMS/MOOD NEUROLOGICAL BOWEL NUTRITION STATUS      Continent    PHYSICIAN VISITS COMMUNICATION OF NEEDS Height & Weight Skin  30 days Verbally  (177.8 cm) 243 lbs. Normal          AMBULATORY STATUS RESPIRATION      Normal      Personal Care Assistance Level of Assistance               Functional Limitations Info                SPECIAL CARE FACTORS FREQUENCY                      Additional Factors Info  Psychotropic     Psychotropic Info: Depakote, Haldol Decanoate, Prozac, Ativan, Cogentin          Current Medications (07/16/2015): Current Facility-Administered Medications  Medication Dose Route Frequency Provider Last Rate Last Dose  . acetaminophen (TYLENOL) tablet 650 mg  650 mg Oral Q6H PRN Shari Prows, MD   650 mg at 07/14/15 0932  . albuterol (PROVENTIL HFA;VENTOLIN HFA) 108 (90 BASE) MCG/ACT inhaler 2 puff  2 puff Inhalation Q6H PRN Jolanta B Pucilowska, MD      . alum & mag hydroxide-simeth (MAALOX/MYLANTA) 200-200-20 MG/5ML suspension 30 mL  30 mL Oral Q4H PRN Jolanta B Pucilowska, MD      . benztropine (COGENTIN) tablet 1 mg  1 mg Oral BID Shari Prows, MD   1 mg at 07/16/15 0931  . divalproex (DEPAKOTE) DR tablet 500 mg  500 mg Oral 3 times per day Shari Prows, MD   500 mg at 07/16/15 1237  . divalproex (DEPAKOTE) DR tablet 500 mg  500 mg Oral QHS Shari Prows, MD   500 mg at 07/15/15 2112  . fenofibrate tablet 54 mg  54 mg Oral Daily Jolanta B Pucilowska, MD   54 mg at 07/16/15 0931  . FLUoxetine (PROZAC) capsule 40 mg  40 mg Oral Daily Jolanta B Pucilowska, MD   40 mg at 07/16/15 0931  . haloperidol (HALDOL) tablet 10 mg  10 mg Oral BID Jolanta B Pucilowska, MD   10 mg at 07/16/15 0931  . haloperidol decanoate (HALDOL DECANOATE) 100 MG/ML injection 100 mg  100 mg Intramuscular Q30 days Shari Prows, MD   100 mg at 07/13/15 1401  .  haloperidol decanoate (HALDOL DECANOATE) 100 MG/ML injection 100 mg  100 mg Intramuscular Once Jolanta B Pucilowska, MD      . hydrOXYzine (ATARAX/VISTARIL) tablet 25 mg  25 mg Oral TID PRN Shari ProwsJolanta B Pucilowska, MD   25 mg at 07/13/15 1401  . levothyroxine (SYNTHROID, LEVOTHROID) tablet 100 mcg  100 mcg Oral QAC breakfast Shari ProwsJolanta B Pucilowska, MD   100 mcg at 07/16/15 [id number]  . LORazepam (ATIVAN) tablet 0.5 mg  0.5 mg Oral TID AC Jolanta B Pucilowska, MD   0.5 mg at 07/16/15 1237  . magnesium hydroxide (MILK OF MAGNESIA) suspension 30 mL  30 mL Oral Daily PRN Jolanta B Pucilowska, MD      . metoprolol tartrate (LOPRESSOR) tablet 25 mg  25 mg Oral BID Shari ProwsJolanta B Pucilowska, MD   25 mg at 07/16/15 0931  . nicotine (NICODERM CQ - dosed in mg/24 hours) patch 21 mg  21 mg Transdermal Q0600 Jolanta B Pucilowska, MD   21 mg at  07/14/15 0600  . traZODone (DESYREL) tablet 150 mg  150 mg Oral QHS Jolanta B Pucilowska, MD   150 mg at 07/15/15 2108  . zolpidem (AMBIEN) tablet 10 mg  10 mg Oral QHS PRN Shari ProwsJolanta B Pucilowska, MD       Do not use this list as official medication orders. Please verify with discharge summary.  Discharge Medications:   Medication List    STOP taking these medications        busPIRone 15 MG tablet  Commonly known as:  BUSPAR     simvastatin 20 MG tablet  Commonly known as:  ZOCOR      TAKE these medications        albuterol 108 (90 BASE) MCG/ACT inhaler  Commonly known as:  PROVENTIL HFA;VENTOLIN HFA  Inhale 1-2 puffs into the lungs every 6 (six) hours as needed for wheezing.     albuterol 108 (90 BASE) MCG/ACT inhaler  Commonly known as:  PROVENTIL HFA;VENTOLIN HFA  Inhale 2 puffs into the lungs every 6 (six) hours as needed for wheezing or shortness of breath.     benztropine 1 MG tablet  Commonly known as:  COGENTIN  Take 1 tablet (1 mg total) by mouth 3 (three) times daily.     divalproex 500 MG DR tablet  Commonly known as:  DEPAKOTE  Take 4 tablets (2,000 mg total) by mouth at bedtime.     fenofibrate 160 MG tablet  Take 1 tablet (160 mg total) by mouth every morning.     FLUoxetine 20 MG capsule  Commonly known as:  PROZAC  Take 3 capsules (60 mg total) by mouth every morning.     haloperidol 5 MG tablet  Commonly known as:  HALDOL  Take 1 tablet (5 mg total) by mouth 2 (two) times daily.     haloperidol decanoate 100 MG/ML injection  Commonly known as:  HALDOL DECANOATE  Inject 2 mLs (200 mg total) into the muscle every 28 (twenty-eight) days.     hydrOXYzine 25 MG tablet  Commonly known as:  ATARAX/VISTARIL  Take 1 tablet (25 mg total) by mouth 3 (three) times daily.     levothyroxine 100 MCG tablet  Commonly known as:  SYNTHROID, LEVOTHROID  Take 1 tablet (100 mcg total) by mouth daily before breakfast.     LORazepam 0.5 MG tablet  Commonly known  as:  ATIVAN  Take 1 tablet (0.5 mg total) by mouth 3 (three) times daily.     metoprolol  tartrate 25 MG tablet  Commonly known as:  LOPRESSOR  Take 1 tablet (25 mg total) by mouth daily.     traZODone 150 MG tablet  Commonly known as:  DESYREL  Take 1 tablet (150 mg total) by mouth at bedtime.         Bernetta Sutley L Simeon Vera, LCSWA

## 2015-07-16 NOTE — Plan of Care (Signed)
Problem: Ineffective individual coping Goal: STG: Patient will remain free from self harm Outcome: Progressing Early snacks given per patient's request, medications administered as ordered by the physician; medications Therapeutic Effects, SEs and Adverse effects discussed, questions encouraged; no PRN given, 15 minute checks maintained for safety, clinical and moral support provided, patient encouraged to continue to express feelings and demonstrate safe care. Patient remain free from harm, will continue to monitor.

## 2015-07-16 NOTE — Plan of Care (Signed)
Problem: [facility] Participation in Recreation Therapeutic Interventions Goal: STG-Patient will identify at least five coping skills for ** STG: Coping Skills - Within 4 treatment sessions, patient will verbalize at least 5 coping skills for anger in each of 2 treatment sessions to increase anger management skills post d/c.  Outcome: Completed/Met Date Met:  07/16/15 Treatment Session 2; Completed 2 out of 2: At approximately 10:25 am, LRT met with patient in patient room. Patient verbalized 5 coping skills for anger. LRT encouraged patient to use his coping skills when he felt he was getting angry to help calm himself down. Intervention Used: Coping Skills worksheet   M , LRT/CTRS 10.27.16 12:37 pm Goal: STG-Other Recreation Therapy Goal (Specify) STG: Stress Management - Within 4 treatment sessions, patient will verbalize understanding of the stress management techniques in each of 2 treatment sessions to increase stress management skills post d/c.  Outcome: Completed/Met Date Met:  07/16/15 Treatment Session 2; Completed 2 out of 2: At approximately 10:25 am, LRT met with patient in patient room. Patient verbalized understanding of the stress management techniques. Intervention Used: Mindfulness and Progressive Muscle Relaxation handouts   M , LRT/CTRS 10.27.16 12:39 pm  Problem: [facility] Participation in Recreation Therapeutic Interventions Goal: STG-Patient will identify at least five coping skills for ** STG: Coping Skills - Within 4 treatment sessions, patient will verbalize at least 5 coping skills for substance abuse in each of 2 treatment sessions to decrease substance abuse post d/c.  Outcome: Completed/Met Date Met:  07/16/15 Treatment Session 2; Completed 2 out of 2: At approximately 10:25 am, LRT met with patient in patient room. Patient verbalized 5 coping skills for substance abuse. LRT encouraged patient to participate in leisure activities. Intervention  Used: Coping Skills worksheet   M , LRT/CTRS 10.27.16 12:40 pm     

## 2015-07-16 NOTE — [facility] Suicide Risk Assessment (Signed)
[facility]   Demographic Factors:  Male, Adolescent or young adult and Caucasian  Total Time spent with patient: 30 minutes  Musculoskeletal: Strength & Muscle Tone: within normal limits Gait & Station: normal Patient leans: N/A  Psychiatric Specialty Exam: Physical Exam  Nursing note and vitals reviewed.   Review of Systems  Neurological: Positive for tremors.  All other systems reviewed and are negative.   Blood pressure 117/76, pulse 82, temperature 98.5 F (36.9 C), temperature source Oral, resp. rate 18, height 5\' 10"  (1.778 m), weight 110.224 kg (243 lb), SpO2 98 %.Body mass index is 34.87 kg/(m^2).  General Appearance: Casual  Eye Contact::  Good  Speech:  Clear and Coherent409  Volume:  Normal  Mood:  Euthymic  Affect:  Appropriate  Thought Process:  Goal Directed  Orientation:  Full (Time, Place, and Person)  Thought Content:  WDL  Suicidal Thoughts:  No  Homicidal Thoughts:  No  Memory:  Immediate;   Fair Recent;   Fair Remote;   Fair  Judgement:  Fair  Insight:  Fair  Psychomotor Activity:  Normal  Concentration:  Fair  Recall:  Fis[employer]Fair  Fund of Knowledge:Fair  Language: Fair  Akathisia:  No  Handed:  Right  AIMS (if indicated):     Assets:  Communication Skills Desire for Improvement Financial Resources/Insurance Housing Physical Health Resilience Social Support  Sleep:  Number of Hours: 8  Cognition: WNL  ADL's:  Intact   Have you used any form of tobacco in the last 30 days? (Cigarettes, Smokeless Tobacco, Cigars, and/or Pipes): Yes  Has this patient used any form of tobacco in the last 30 days? (Cigarettes, Smokeless Tobacco, Cigars, and/or Pipes) Yes, A prescription for an FDA-approved tobacco cessation medication was offered at discharge and the patient refused  Mental Status Per Nursing Assessment::   On Admission:     Current Mental Status by Physician: NA  Loss Factors: NA  Historical Factors: Prior  suicide attempts and Family history of mental illness or substance abuse  Risk Reduction Factors:   Sense of responsibility to family, Living with another person, especially a relative, Positive social support and Positive therapeutic relationship  Continued Clinical Symptoms:  Schizophrenia:   Depressive state Less than 22 years old Paranoid or undifferentiated type  Cognitive Features That Contribute To Risk:  None    Suicide Risk:  Minimal: No identifiable suicidal ideation.  Patients presenting with no risk factors but with morbid ruminations; may be classified as minimal risk based on the severity of the depressive symptoms  Principal Problem: Schizophrenia, paranoid [facility](HCC) Discharge Diagnoses:  Patient Active Problem List   Diagnosis Date Noted  . Tobacco use disorder [F17.200] 07/13/2015  . Hypothyroidism [E03.9] 07/13/2015  . Dyslipidemia [E78.5] 07/13/2015  . HTN (hypertension) [I10] 07/13/2015  . 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 Cannabis use disorder, moderate, dependence (HCC) [F12.20] 11/17/2014      Plan Of Care/Follow-up recommendations:  Activity:  As tolerated. Diet:  Low sodium heart healthy. Other:  Keep follow-up appointments.  Is patient on multiple antipsychotic therapies at discharge:  No   Has Patient had three or more failed trials of antipsychotic monotherapy by history:  No  Recommended Plan for Multiple Antipsychotic Therapies: NA    [name] 07/16/2015, 11:51 AM

## 2015-07-16 NOTE — Tx Team (Signed)
Interdisciplinary Treatment Plan Update (Adult)  Date:  07/16/2015 Time Reviewed:  3:33 PM  Progress in Treatment: Attending groups: No. Participating in groups:  No. Taking medication as prescribed:  Yes. Tolerating medication:  Yes. Family/Significant othe contact made:  Yes, individual(s) contacted:  Tilman Neat  Patient understands diagnosis:  Yes. Discussing patient identified problems/goals with staff:  Yes. Medical problems stabilized or resolved:  Yes. Denies suicidal/homicidal ideation: Yes. Issues/concerns per patient self-inventory:  Yes. Other:  New problem(s) identified: No, Describe:  Na  Discharge Plan or Barriers: Pt plans to return to group home and follow up with [facility].   Reason for Continuation of Hospitalization: Depression Medication stabilization Suicidal ideation  Comments: [name] feels slightly better today but he reports poor sleep last night. At this morning he feels sedated from oral Haldol. He still is depressed. He still has auditory hallucinations that are derogatory and commanding. He has not been able to get in touch with his father makes him a little anxious and she needs to clarify whether or not he will be able to return to his group home in [place]. There are no somatic complaints. He has been going to groups in spite of heightened anxiety  Estimated length of stay: Pt will likely d/c today.   New goal(s):  Review of initial/current patient goals per problem list:   1.  Goal(s): Patient will participate in aftercare plan * Met:  * Target date: at discharge * As evidenced by: Patient will participate within aftercare plan AEB aftercare provider and housing plan at discharge being identified.   2.  Goal (s): Patient will exhibit decreased depressive symptoms and suicidal ideations. * Met:  *  Target date: at discharge * As evidenced by: Patient will utilize self rating of depression at 3 or below and demonstrate decreased  signs of depression or be deemed stable for discharge by MD.  3.  Goal (s): Patient will demonstrate decreased symptoms of psychosis. * Met: No  *  Target date: at discharge * As evidenced by: Patient will not endorse signs of psychosis or be deemed stable for discharge by MD.   Attendees: Patient:  [name] 10/27/20163:33 PM  Family:   10/27/20163:33 PM  Physician:  Dr. Bary Leriche  10/27/20163:33 PM  Nursing:   Silva Bandy, RN  10/27/20163:33 PM  Case Manager:   10/27/20163:33 PM  Counselor:   10/27/20163:33 PM  Other:  Wray Kearns, [place] 10/27/20163:33 PM  Other:  Everitt Amber, [place] 10/27/20163:33 PM  Other:   10/27/20163:33 PM  Other:  10/27/20163:33 PM  Other:  10/27/20163:33 PM  Other:  10/27/20163:33 PM  Other:  10/27/20163:33 PM  Other:  10/27/20163:33 PM  Other:  10/27/20163:33 PM  Other:   10/27/20163:33 PM   Scribe for Treatment Team:   Wray Kearns [name], [place] , 07/16/2015, 3:33 PM

## 2015-07-16 NOTE — Progress Notes (Signed)
Richard OhmChris said that his father will pick him up tomorrow, he was allowed to vent, "I plan to go back to school and obtain my GED, and then go to college and major in computer science." Support system: father and some of his friends, "[place]. Patient denied SI/HI, denied AV/H, evasive, poor eye contact when the subject changed to Cannabis abuse.

## 2015-07-16 NOTE — [facility] Group Notes (Signed)
[facility] Group Notes:  (Nursing/MHT/Case Management/Adjunct)  Date:  07/16/2015  Time:  2:24 PM  Type of Therapy:  Psychoeducational Skills  Participation Level:  Did Not Attend  Lynelle SmokeCara Travis [facility] 07/16/2015, 2:24 PM

## 2015-07-16 NOTE — [facility] Suicide Risk Assessment (Signed)
[facility] INPATIENT:  Family/Significant Other Suicide Prevention Education  Suicide Prevention Education:  Education Completed;Heather RobertsLarry Safi [phone] has been identified by the patient as the family member/significant other with whom the patient will be residing, and identified as the person(s) who will aid the patient in the event of a mental health crisis (suicidal ideations/suicide attempt).  With written consent from the patient, the family member/significant other has been provided the following suicide prevention education, prior to the and/or following the discharge of the patient.  The suicide prevention education provided includes the following:  Suicide risk factors  Suicide prevention and interventions  National Suicide Hotline telephone number  [facility] assessment telephone number  [facility] Emergency Assistance 911  [facility] and/or Residential Mobile Crisis Unit telephone number  Request made of family/significant other to:  Remove weapons (e.g., guns, rifles, knives), all items previously/currently identified as safety concern.    Remove drugs/medications (over-the-counter, prescriptions, illicit drugs), all items previously/currently identified as a safety concern.  The family member/significant other verbalizes understanding of the suicide prevention education information provided.  The family member/significant other agrees to remove the items of safety concern listed above.  Sempra EnergyCandace L Donella Pascarella [name], LCSWA  07/16/2015, 12:35 PM

## 2015-07-16 NOTE — Discharge Summary (Signed)
Physician Discharge Summary Note  Patient:  [name] is an 24 y.o., male MRN:  [id number] DOB:  06-Oct-1990 Patient phone:  [phone] (home)  Patient address:   [address] [place] [address],  Total Time spent with patient: 30 minutes  Date of Admission:  07/13/2015 Date of Discharge: 07/16/2015  Reason for Admission:  Suicidal ideation.  Identifying data. [name] is a 24 year old male with a history of schizophrenia.  Chief complaint. "They took advantage of me."  History of present illness. Information was obtained from the patient and the chart. The patient has a long history of mental illness with his first hospitalization at the age of 48 he was relatively stable on a combination of Haldol and Depakote and Prozac when he stay at the group home [facility]. Couple weeks ago he left the group home to live independently with a group of friends. He did not have a way to refill his prescriptions and ran out of medications 2 weeks ago. He no longer wants to live with his friends. He feared feels that they took advantage of him. He wants to return to his group home which apparently is possible as long as he returns there before the end of the month. When off medication the patient started experiencing disturbing auditory command hallucinations telling him to hurt himself, making derogatory comments. He became depressed with poor sleep, decreased appetite, anhedonia, feeling of guilt and hopelessness worthlessness, poor energy and concentration social isolation heightened anxiety and exacerbation of psychotic symptoms. He came to the hospital with his friends asking for help. He denies symptoms suggestive of bipolar mania. He denies OCD or PTSD type of symptoms. He does experience more frequent panic attacks recently. He drinks 2 beers a week if that. He is a daily marijuana smoker. He denies other illicit substance use.  Past psychiatric history. Diagnosed with mental  illness at the age of 66. There were multiple psychiatric hospitalizations throughout. He reports one suicide attempt a few months ago by Depakote overdose for which she was not hospitalized. He's been tried on multiple medications. He believes that Haldol and Depakote worked best for him. He reportedly is on Haldol Decanoate 200 mg every 4 weeks in addition to oral Haldol 10 mg twice daily. Reportedly and his psychiatrist plan wean him off oral Haldol.  Family psychiatric history. Mother with schizophrenia.   Social history. He is his own guardian but his father is trying to obtain guardianship. She wants to go back to his group home in [place].  Principal Problem: Schizophrenia, paranoid [facility]) Discharge Diagnoses: Patient Active Problem List   Diagnosis Date Noted  . Tobacco use disorder [F17.200] 07/13/2015  . Hypothyroidism [E03.9] 07/13/2015  . Dyslipidemia [E78.5] 07/13/2015  . HTN (hypertension) [I10] 07/13/2015  . 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 Cannabis use disorder, moderate, dependence (HCC) [F12.20] 11/17/2014    Musculoskeletal: Strength & Muscle Tone: within normal limits Gait & Station: normal Patient leans: N/A  Psychiatric Specialty Exam: Physical Exam  Nursing note and vitals reviewed.   Review of Systems  Neurological: Positive for tremors.  All other systems reviewed and are negative.   Blood pressure 117/76, pulse 82, temperature 98.5 F (36.9 C), temperature source Oral, resp. rate 18, height 5\' 10"  (1.778 m), weight 110.224 kg (243 lb), SpO2 98 %.Body mass index is 34.87 kg/(m^2).  See SRA.  Sleep:  Number of Hours: 8   Have you used any form of tobacco in the last 30 days? (Cigarettes, Smokeless Tobacco, Cigars, and/or Pipes): Yes  Has this patient used any form of tobacco in the last 30 days? (Cigarettes, Smokeless Tobacco, Cigars, and/or Pipes) Yes, A  prescription for an FDA-approved tobacco cessation medication was offered at discharge and the patient refused  Past Medical History:  Past Medical History  Diagnosis Date  . Schizophrenia (HCC)   . Hypothyroidism   . Hypercholesterolemia   . Hypertension   . Depression   . Bipolar disorder [facility])     Past Surgical History  Procedure Laterality Date  . Tonsillectomy    . Cardiac surgery      for Epstein's abnormality at 24yo   Family History: History reviewed. No pertinent family history. Social History:  History  Alcohol Use No    Comment: occ.     History  Drug Use  . Yes  . Special: Marijuana    Comment: occ- last used 07/12/2015 at noon    Social History   Social History  . Marital Status: Single    Spouse Name: N/A  . Number of Children: N/A  . Years of Education: N/A   Social History Main Topics  . Smoking status: Current Every Day Smoker -- 1.00 packs/day    Types: Cigarettes  . Smokeless tobacco: None  . Alcohol Use: No     Comment: occ.  . Drug Use: Yes    Special: Marijuana     Comment: occ- last used 07/12/2015 at noon  . Sexual Activity: No   Other Topics Concern  . None   Social History Narrative    Past Psychiatric History: Hospitalizations:  Outpatient Care:  Substance Abuse Care:  Self-Mutilation:  Suicidal Attempts:  Violent Behaviors:   Risk to Self: Is patient at risk for suicide?: No Risk to Others:   Prior Inpatient Therapy:   Prior Outpatient Therapy:    Level of Care:  OP  Hospital Course:    [name] is a 24 year old male with a history of schizophrenia admitted for command auditory hallucinations and suicidal ideation in the context of medication discontinuation.  1. Suicidal ideation. This has resolved. The patient is able to contract for safety.  2. Psychosis. The patient has been maintained on a combination of Haldol Decanoate injections, oral Haldol, Depakote, Prozac, and Cogentin for depression, psychosis, and  mood stabilization. We will restart medications and check the Depakote level prior to discharge. He received 100 mg of Haldol Decanoate on 10/25 and additional dose of 100 mg today on 07/16/2015 as this is his regular dose.  3. Anxiety. She was maintained on hydroxyzine and low-dose Ativan.  4. Asthma. Albuterol inhaler ws available.  5. Hypothyroidism. We restarted Synthroid 100 g.  6. Smoking. Nicotine patch was available.  7. Dyslipidemia. We restarted fenofibrate 160 mg daily.  8. Insomnia. We restarted trazodone 150 mg.   9. Hypertension. We restarted metoprolol.  10. Cannabis abuse. The patient is a daily marijuana smoker but wants to stop. He is not interested in residential treatment rather he will follow-up with his regular psychiatrist.   11. Metabolic syndrome. Triglycerides are elevated, TSH norma, HgbA1C pending at the time of discharge.   12. Disposition. The patient is discharged back to his group home in [facility]. He will follow-up with his regular provider at [facility].    Consults:  None  Significant Diagnostic Studies:  None  Discharge Vitals:  Blood pressure 117/76, pulse 82, temperature 98.5 F (36.9 C), temperature source Oral, resp. rate 18, height 5\' 10"  (1.778 m), weight 110.224 kg (243 lb), SpO2 98 %. Body mass index is 34.87 kg/(m^2). Lab Results:   Results for orders placed or performed during the hospital encounter of 07/13/15 (from the past 72 hour(s))  Glucose, capillary     Status: Abnormal   Collection Time: 07/13/15  8:37 PM  Result Value Ref Range   Glucose-Capillary 118 (H) 65 - 99 mg/dL   Comment 1 Notify RN   Lipid panel     Status: Abnormal   Collection Time: 07/16/15  7:39 AM  Result Value Ref Range   Cholesterol 163 0 - 200 mg/dL   Triglycerides 604312 (H) <150 mg/dL   HDL 30 (L) >54>40 mg/dL   Total CHOL/HDL Ratio 5.4 RATIO   VLDL 62 (H) 0 - 40 mg/dL   LDL Cholesterol 71 0 - 99 mg/dL    Comment:        Total Cholesterol/HDL:CHD  Risk Coronary Heart Disease Risk Table                     Men   Women  1/2 Average Risk   3.4   3.3  Average Risk       5.0   4.4  2 X Average Risk   9.6   7.1  3 X Average Risk  23.4   11.0        Use the calculated Patient Ratio above and the CHD Risk Table to determine the patient's CHD Risk.        ATP III CLASSIFICATION (LDL):  <100     mg/dL   Optimal  098-119100-129  mg/dL   Near or Above                    Optimal  130-159  mg/dL   Borderline  147-829160-189  mg/dL   High  >562>190     mg/dL   Very High   TSH     Status: None   Collection Time: 07/16/15  7:39 AM  Result Value Ref Range   TSH 3.544 0.350 - 4.500 uIU/mL    Physical Findings: AIMS: Facial and Oral Movements Muscles of Facial Expression: None, normal Lips and Perioral Area: None, normal Jaw: None, normal Tongue: None, normal,Extremity Movements Upper (arms, wrists, hands, fingers): None, normal Lower (legs, knees, ankles, toes): None, normal, Trunk Movements Neck, shoulders, hips: None, normal, Overall Severity Severity of abnormal movements (highest score from questions above): None, normal Incapacitation due to abnormal movements: None, normal Patient's awareness of abnormal movements (rate only patient's report): No Awareness, Dental Status Current problems with teeth and/or dentures?: No Does patient usually wear dentures?: No  CIWA:  CIWA-Ar Total: 0 COWS:      See Psychiatric Specialty Exam and Suicide Risk Assessment completed by Attending Physician prior to discharge.  Discharge destination:  Home  Is patient on multiple antipsychotic therapies at discharge:  No   Has Patient had three or more failed trials of antipsychotic monotherapy by history:  No    Recommended Plan for Multiple Antipsychotic Therapies: NA  Discharge Instructions    Diet - low sodium heart healthy    Complete by:  As directed      Increase activity slowly    Complete by:  As directed             Medication List  STOP taking these medications        busPIRone 15 MG tablet  Commonly known as:  BUSPAR     simvastatin 20 MG tablet  Commonly known as:  ZOCOR      TAKE these medications      Indication   albuterol 108 (90 BASE) MCG/ACT inhaler  Commonly known as:  PROVENTIL HFA;VENTOLIN HFA  Inhale 1-2 puffs into the lungs every 6 (six) hours as needed for wheezing.      albuterol 108 (90 BASE) MCG/ACT inhaler  Commonly known as:  PROVENTIL HFA;VENTOLIN HFA  Inhale 2 puffs into the lungs every 6 (six) hours as needed for wheezing or shortness of breath.   Indication:  Asthma     benztropine 1 MG tablet  Commonly known as:  COGENTIN  Take 1 tablet (1 mg total) by mouth 3 (three) times daily.   Indication:  Extrapyramidal Reaction caused by Medications     divalproex 500 MG DR tablet  Commonly known as:  DEPAKOTE  Take 4 tablets (2,000 mg total) by mouth at bedtime.   Indication:  Schizophrenia, Mood lability     fenofibrate 160 MG tablet  Take 1 tablet (160 mg total) by mouth every morning.   Indication:  High Amount of Cholesterol in the Blood, High Amount of Triglycerides in the Blood     FLUoxetine 20 MG capsule  Commonly known as:  PROZAC  Take 3 capsules (60 mg total) by mouth every morning.   Indication:  Depression     haloperidol 5 MG tablet  Commonly known as:  HALDOL  Take 1 tablet (5 mg total) by mouth 2 (two) times daily.   Indication:  Schizophrenia     haloperidol decanoate 100 MG/ML injection  Commonly known as:  HALDOL DECANOATE  Inject 2 mLs (200 mg total) into the muscle every 28 (twenty-eight) days.   Indication:  Schizophrenia     hydrOXYzine 25 MG tablet  Commonly known as:  ATARAX/VISTARIL  Take 1 tablet (25 mg total) by mouth 3 (three) times daily.   Indication:  Anxiety Neurosis     levothyroxine 100 MCG tablet  Commonly known as:  SYNTHROID, LEVOTHROID  Take 1 tablet (100 mcg total) by mouth daily before breakfast.   Indication:  Underactive  Thyroid     LORazepam 0.5 MG tablet  Commonly known as:  ATIVAN  Take 1 tablet (0.5 mg total) by mouth 3 (three) times daily.   Indication:  Feeling Anxious     metoprolol tartrate 25 MG tablet  Commonly known as:  LOPRESSOR  Take 1 tablet (25 mg total) by mouth daily.   Indication:  High Blood Pressure     traZODone 150 MG tablet  Commonly known as:  DESYREL  Take 1 tablet (150 mg total) by mouth at bedtime.   Indication:  Trouble Sleeping         Follow-up recommendations:  Activity:  As tolerated. Diet:  Low sodium heart healthy. Other:  Keep follow-up appointments.  Comments:    Total Discharge Time: 35 min.   Signed: Kristine Linea 07/16/2015, 11:55 AM

## 2015-07-16 NOTE — Progress Notes (Signed)
Denies SI/HI/AVH.  Denies depression.  States that he is just tired because he is getting use to his medications again.   Discharge instructions given, verbalized understanding.  Prescriptions and crisis card given. Personal belongings returned.  Escorted off unit by this Clinical research associatewriter to waiting area to meet father to travel to the group home.  Discharge instructions reviewed with patient father with patients permission. Care released to father.

## 2015-07-17 NOTE — Progress Notes (Signed)
Recreation Therapy Notes  INPATIENT RECREATION TR PLAN  Patient Details Name: [name] MRN: [id number] DOB: Feb 26, 1991 Today's Date: 07/17/2015  Rec Therapy Plan Is patient appropriate for Therapeutic Recreation?: Yes Treatment times per week: At least once a week TR Treatment/Interventions: 1:1 session, Group participation (Comment) (Appropriate participation in daily recreation therapy tx)  Discharge Criteria Pt will be discharged from therapy if:: Treatment goals are met, Discharged Treatment plan/goals/alternatives discussed and agreed upon by:: Patient/family  Discharge Summary Short term goals set: See Care Plan Short term goals met: Complete Progress toward goals comments: One-to-one attended Which groups?: Goal setting One-to-one attended: Anger management, stress management, coping skills Reason goals not met: N/A Therapeutic equipment acquired: None Reason patient discharged from therapy: Discharge from hospital Pt/family agrees with progress & goals achieved: Yes Date patient discharged from therapy: 07/16/15   Leonette [facility], LRT/CTRS 07/17/2015, 4:36 PM

## 2016-11-07 DIAGNOSIS — R079 Chest pain, unspecified: Secondary | ICD-10-CM | POA: Insufficient documentation

## 2016-11-07 DIAGNOSIS — R002 Palpitations: Secondary | ICD-10-CM | POA: Insufficient documentation

## 2018-10-18 DIAGNOSIS — Q225 Ebstein's anomaly: Secondary | ICD-10-CM | POA: Insufficient documentation

## 2018-10-18 DIAGNOSIS — K219 Gastro-esophageal reflux disease without esophagitis: Secondary | ICD-10-CM | POA: Insufficient documentation

## 2018-10-18 DIAGNOSIS — G8929 Other chronic pain: Secondary | ICD-10-CM | POA: Insufficient documentation

## 2019-10-02 DIAGNOSIS — Z9889 Other specified postprocedural states: Secondary | ICD-10-CM | POA: Insufficient documentation

## 2019-10-02 DIAGNOSIS — I422 Other hypertrophic cardiomyopathy: Secondary | ICD-10-CM | POA: Insufficient documentation

## 2019-11-26 DIAGNOSIS — M5136 Other intervertebral disc degeneration, lumbar region: Secondary | ICD-10-CM | POA: Insufficient documentation

## 2019-11-26 DIAGNOSIS — M48062 Spinal stenosis, lumbar region with neurogenic claudication: Secondary | ICD-10-CM | POA: Insufficient documentation

## 2019-11-26 DIAGNOSIS — G8929 Other chronic pain: Secondary | ICD-10-CM | POA: Insufficient documentation

## 2020-07-07 DIAGNOSIS — Z6841 Body Mass Index (BMI) 40.0 and over, adult: Secondary | ICD-10-CM | POA: Insufficient documentation

## 2020-09-20 ENCOUNTER — Emergency Department (HOSPITAL_COMMUNITY)
Admission: EM | Admit: 2020-09-20 | Discharge: 2020-09-21 | Discharge status: Against Medical Advice | Payer: Federal, State, Local not specified - PPO | Attending: Emergency Medicine | Admitting: Emergency Medicine

## 2020-09-20 ENCOUNTER — Encounter (HOSPITAL_COMMUNITY): Payer: Self-pay | Admitting: Emergency Medicine

## 2020-09-20 ENCOUNTER — Other Ambulatory Visit: Payer: Self-pay

## 2020-09-20 DIAGNOSIS — Z79899 Other long term (current) drug therapy: Secondary | ICD-10-CM | POA: Insufficient documentation

## 2020-09-20 DIAGNOSIS — F1590 Other stimulant use, unspecified, uncomplicated: Secondary | ICD-10-CM | POA: Insufficient documentation

## 2020-09-20 DIAGNOSIS — I10 Essential (primary) hypertension: Secondary | ICD-10-CM | POA: Insufficient documentation

## 2020-09-20 DIAGNOSIS — M5430 Sciatica, unspecified side: Secondary | ICD-10-CM | POA: Diagnosis not present

## 2020-09-20 DIAGNOSIS — F17210 Nicotine dependence, cigarettes, uncomplicated: Secondary | ICD-10-CM | POA: Diagnosis not present

## 2020-09-20 DIAGNOSIS — E039 Hypothyroidism, unspecified: Secondary | ICD-10-CM | POA: Diagnosis not present

## 2020-09-20 DIAGNOSIS — R443 Hallucinations, unspecified: Secondary | ICD-10-CM | POA: Insufficient documentation

## 2020-09-20 NOTE — ED Notes (Signed)
Pt has been wanded by security. 

## 2020-09-20 NOTE — ED Notes (Signed)
Pt states he called for his ride

## 2020-09-20 NOTE — ED Provider Notes (Signed)
[facility] EMERGENCY DEPARTMENT Provider Note   CSN: [id number] Arrival date & time: 09/20/20  2015     History Chief Complaint  Patient presents with  . Hallucinations    [name] is a 30 y.o. male.  The history is provided by the patient. No language interpreter was used.  Mental Health Problem Presenting symptoms: hallucinations   Degree of incapacity (severity):  Moderate Onset quality:  Gradual Timing:  Constant Treatment compliance:  All of the time Relieved by:  Nothing Worsened by:  Nothing Ineffective treatments:  None tried Pt reports he was seeing shadows tonight and called his act team.  Pt reports he was told to come to ED.  Pt denies any current complaint except for sciatica.  Pt denies suicidal or homicidal thoughts.     Pt reports he gets his Haldol shot on Tuesday.  Pt states he is not currently having hallucinations.  Pt reports he took his medication at home before coming in and now feels better.  Past Medical History:  Diagnosis Date  . Bipolar disorder ([place])   . Depression   . Hypercholesterolemia   . Hypertension   . Hypothyroidism   . Schizophrenia [facility])     Patient Active Problem List   Diagnosis Date Noted  . Tobacco use disorder 07/13/2015  . Hypothyroidism 07/13/2015  . Dyslipidemia 07/13/2015  . HTN (hypertension) 07/13/2015  . Schizophrenia, paranoid ([place]) 07/13/2015  . Cannabis use disorder, moderate, dependence ([place]) 11/17/2014    Past Surgical History:  Procedure Laterality Date  . CARDIAC SURGERY     for Epstein's abnormality at 30yo  . TONSILLECTOMY         History reviewed. No pertinent family history.  Social History   Tobacco Use  . Smoking status: Current Every Day Smoker    Packs/day: 1.00    Types: Cigarettes  . Smokeless tobacco: Never Used  Vaping Use  . Vaping Use: Never used  Substance Use Topics  . Alcohol use: No    Comment: occ.  . Drug use: Yes    Frequency: 2.0 times per week     Types: Marijuana    Comment: occ- last used 09/19/2020    Home Medications Prior to Admission medications   Medication Sig Start Date End Date Taking? Authorizing Provider  albuterol (PROVENTIL HFA;VENTOLIN HFA) 108 (90 BASE) MCG/ACT inhaler Inhale 1-2 puffs into the lungs every 6 (six) hours as needed for wheezing.    [provider]  albuterol (PROVENTIL HFA;VENTOLIN HFA) 108 (90 BASE) MCG/ACT inhaler Inhale 2 puffs into the lungs every 6 (six) hours as needed for wheezing or shortness of breath. 07/16/15   Pucilowska, Jolanta B, MD  benztropine (COGENTIN) 1 MG tablet Take 1 tablet (1 mg total) by mouth 3 (three) times daily. 07/16/15   Pucilowska, Herma Ard B, MD  divalproex (DEPAKOTE) 500 MG DR tablet Take 4 tablets (2,000 mg total) by mouth at bedtime. 07/16/15   Pucilowska, Herma Ard B, MD  fenofibrate 160 MG tablet Take 1 tablet (160 mg total) by mouth every morning. 07/16/15   Pucilowska, Herma Ard B, MD  FLUoxetine (PROZAC) 20 MG capsule Take 3 capsules (60 mg total) by mouth every morning. 07/16/15   Pucilowska, Jolanta B, MD  haloperidol (HALDOL) 5 MG tablet Take 1 tablet (5 mg total) by mouth 2 (two) times daily. 07/16/15   Pucilowska, Jolanta B, MD  haloperidol decanoate (HALDOL DECANOATE) 100 MG/ML injection Inject 2 mLs (200 mg total) into the muscle every 28 (twenty-eight) days. 07/16/15  Pucilowska, Jolanta B, MD  hydrOXYzine (ATARAX/VISTARIL) 25 MG tablet Take 1 tablet (25 mg total) by mouth 3 (three) times daily. 07/16/15   Pucilowska, Braulio Conte B, MD  levothyroxine (SYNTHROID, LEVOTHROID) 100 MCG tablet Take 1 tablet (100 mcg total) by mouth daily before breakfast. 07/16/15   Pucilowska, Jolanta B, MD  LORazepam (ATIVAN) 0.5 MG tablet Take 1 tablet (0.5 mg total) by mouth 3 (three) times daily. 07/16/15   Pucilowska, Braulio Conte B, MD  metoprolol tartrate (LOPRESSOR) 25 MG tablet Take 1 tablet (25 mg total) by mouth daily. 07/16/15   Pucilowska, Braulio Conte B, MD  traZODone (DESYREL)  150 MG tablet Take 1 tablet (150 mg total) by mouth at bedtime. 07/16/15   Pucilowska, Ellin Goodie, MD    Allergies    Patient has no known allergies.  Review of Systems   Review of Systems  Psychiatric/Behavioral: Positive for hallucinations.  All other systems reviewed and are negative.   Physical Exam Updated Vital Signs BP (!) 145/108 (BP Location: Right Arm)   Pulse (!) 112   Temp 98.3 F (36.8 C) (Oral)   Resp (!) 22   Ht 5\' 11"  (1.803 m)   Wt 122.8 kg   SpO2 96%   BMI 37.77 kg/m   Physical Exam Vitals and nursing note reviewed.  Constitutional:      Appearance: He is well-developed and well-nourished.  HENT:     Head: Normocephalic and atraumatic.  Eyes:     Conjunctiva/sclera: Conjunctivae normal.  Cardiovascular:     Rate and Rhythm: Normal rate and regular rhythm.     Heart sounds: No murmur heard.   Pulmonary:     Effort: Pulmonary effort is normal. No respiratory distress.     Breath sounds: Normal breath sounds.  Abdominal:     Palpations: Abdomen is soft.     Tenderness: There is no abdominal tenderness.  Musculoskeletal:        General: No edema.     Cervical back: Neck supple.  Skin:    General: Skin is warm and dry.  Neurological:     General: No focal deficit present.     Mental Status: He is alert.  Psychiatric:        Mood and Affect: Mood and affect and mood normal.     ED Results / Procedures / Treatments   Labs (all labs ordered are listed, but only abnormal results are displayed) Labs Reviewed  RESP PANEL BY RT-PCR (FLU A&B, COVID) [id number]  COMPREHENSIVE METABOLIC PANEL  ETHANOL  RAPID URINE DRUG SCREEN, HOSP PERFORMED  CBC WITH DIFFERENTIAL/PLATELET    EKG None  Radiology No results found.  Procedures Procedures (including critical care time)  Medications Ordered in ED Medications - No data to display  ED Course  I have reviewed the triage vital signs and the nursing notes.  Pertinent labs & imaging results that  were available during my care of the patient were reviewed by me and considered in my medical decision making (see chart for details).    MDM Rules/Calculators/A&P                          MDM:  Pt declines blood work.  Pt wants to leave [facility]a.  Pt is not suicidal or homicidal.  He does not seem to be hallucinating.  Final Clinical Impression(s) / ED Diagnoses Final diagnoses:  Sciatica, unspecified laterality  Hallucinations    Rx / DC Orders ED Discharge Orders  None       Osie Cheeks 09/20/20 2302    Benjiman Core, MD 09/28/20 [phone]

## 2020-09-20 NOTE — ED Triage Notes (Addendum)
Pt complains of hallucinations and seeing black spots that began at 1920. Pt has a HX of schizophrenia, pt states he gets a Haldol/dekonate shot every Tuesday.  Pt's mood has been labile.  Pt denies SI/HI.  Pt called [facility] and was told to come to the ED. Pt denies starting any new medication recently.

## 2020-09-20 NOTE — ED Notes (Signed)
Pt states he wants to go home, says "I feel fine and i'm going to see my nurse in a day so I'm fine."   Cheron Schaumann, PA made aware.

## 2020-10-01 ENCOUNTER — Emergency Department (HOSPITAL_COMMUNITY)
Admission: EM | Admit: 2020-10-01 | Discharge: 2020-10-01 | Disposition: A | Discharge status: Home / Self Care | Payer: Federal, State, Local not specified - PPO | Attending: Emergency Medicine | Admitting: Emergency Medicine

## 2020-10-01 ENCOUNTER — Encounter (HOSPITAL_COMMUNITY): Payer: Self-pay

## 2020-10-01 ENCOUNTER — Other Ambulatory Visit: Payer: Self-pay

## 2020-10-01 ENCOUNTER — Emergency Department (HOSPITAL_COMMUNITY): Payer: Federal, State, Local not specified - PPO

## 2020-10-01 DIAGNOSIS — F17210 Nicotine dependence, cigarettes, uncomplicated: Secondary | ICD-10-CM | POA: Insufficient documentation

## 2020-10-01 DIAGNOSIS — R079 Chest pain, unspecified: Secondary | ICD-10-CM

## 2020-10-01 DIAGNOSIS — E039 Hypothyroidism, unspecified: Secondary | ICD-10-CM | POA: Diagnosis not present

## 2020-10-01 DIAGNOSIS — Z79899 Other long term (current) drug therapy: Secondary | ICD-10-CM | POA: Insufficient documentation

## 2020-10-01 DIAGNOSIS — R002 Palpitations: Secondary | ICD-10-CM | POA: Diagnosis not present

## 2020-10-01 DIAGNOSIS — I10 Essential (primary) hypertension: Secondary | ICD-10-CM | POA: Diagnosis not present

## 2020-10-01 DIAGNOSIS — R072 Precordial pain: Secondary | ICD-10-CM | POA: Insufficient documentation

## 2020-10-01 LAB — COMPREHENSIVE METABOLIC PANEL
ALT: 54 U/L — ABNORMAL HIGH (ref 0–44)
AST: 28 U/L (ref 15–41)
Albumin: 4.3 g/dL (ref 3.5–5.0)
Alkaline Phosphatase: 106 U/L (ref 38–126)
Anion gap: 8 (ref 5–15)
BUN: 16 mg/dL (ref 6–20)
CO2: 25 mmol/L (ref 22–32)
Calcium: 10 mg/dL (ref 8.9–10.3)
Chloride: 106 mmol/L (ref 98–111)
Creatinine, Ser: 0.79 mg/dL (ref 0.61–1.24)
GFR, Estimated: >60 mL/min (ref >60)
Glucose, Bld: 111 mg/dL — ABNORMAL HIGH (ref 70–99)
Potassium: 4.1 mmol/L (ref 3.5–5.1)
Sodium: 139 mmol/L (ref 135–145)
Total Bilirubin: 0.5 mg/dL (ref 0.3–1.2)
Total Protein: 7.5 g/dL (ref 6.5–8.1)

## 2020-10-01 LAB — CBC
HCT: 48.5 % (ref 39.0–52.0)
Hemoglobin: 16.2 g/dL (ref 13.0–17.0)
MCH: 30.1 pg (ref 26.0–34.0)
MCHC: 33.4 g/dL (ref 30.0–36.0)
MCV: 90.1 fL (ref 80.0–100.0)
Platelets: 258 10*3/uL (ref 150–400)
RBC: 5.38 MIL/uL (ref 4.22–5.81)
RDW: 13.6 % (ref 11.5–15.5)
WBC: 10.3 10*3/uL (ref 4.0–10.5)
nRBC: 0 % (ref 0.0–0.2)

## 2020-10-01 LAB — D-DIMER, QUANTITATIVE: D-Dimer, Quant: 0.36 ug/mL-FEU (ref 0.00–0.50)

## 2020-10-01 LAB — TSH: TSH: 26.09 u[IU]/mL — ABNORMAL HIGH (ref 0.350–4.500)

## 2020-10-01 NOTE — ED Triage Notes (Signed)
Pt starting having chest pain while listening to music in his living room.  No chest pain at this time.  HX of heart issues

## 2020-10-01 NOTE — ED Provider Notes (Signed)
[facility] EMERGENCY DEPARTMENT Provider Note   CSN: [id number] Arrival date & time: 10/01/20  1848     History Chief Complaint  Patient presents with  . Chest Pain    [name] is a 30 y.o. male.  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  Ever since that time he is struggled with atrial fibrillation which is paroxysmal.  He takes metoprolol in the morning but does not take any anticoagulants and follows with his cardiologist though he has not seen them in some time.  The patient denies any significant alcohol use but he does smoke a half a pack of cigarettes a day and there does appear to be a history of marijuana use as well.  He denies increasing caffeine intake, he denies other stimulant use or energy drinks.  He was in his usual state of health this morning where he lives by himself in a local apartment when he noticed an acute onset of feeling like pins-and-needles and pain around his heart with a rapid heartbeat feeling like his pulse was weak.  This has improved but is still present, he called the paramedics who noted him to be tachycardic, normotensive and without any hypoxia.  The patient has not had any sick contacts, long travel, recent illnesses, recent surgery, no history of swelling of the legs or blood clots.  His symptoms have been persistent but gradually improving.  He has not missed any of his medications including his daily metoprolol.  Past Medical History:  Diagnosis Date  . Bipolar disorder (HCC)   . Depression   . Hypercholesterolemia   . Hypertension   . Hypothyroidism   . Schizophrenia [facility])     Patient Active Problem List   Diagnosis Date Noted  . Tobacco use disorder 07/13/2015  .  Hypothyroidism 07/13/2015  . Dyslipidemia 07/13/2015  . HTN (hypertension) 07/13/2015  . Schizophrenia, paranoid (HCC) 07/13/2015  . Cannabis use disorder, moderate, dependence (HCC) 11/17/2014    Past Surgical History:  Procedure Laterality Date  . CARDIAC SURGERY     for Epstein's abnormality at 30yo  . TONSILLECTOMY         History reviewed. No pertinent family history.  Social History   Tobacco Use  . Smoking status: Current Every Day Smoker    Packs/day: 1.00    Types: Cigarettes  . Smokeless tobacco: Never Used  Vaping Use  . Vaping Use: Never used  Substance Use Topics  . Alcohol use: No    Comment: occ.  . Drug use: Yes    Frequency: 2.0 times per week    Types: Marijuana    Comment: occ- last used 09/19/2020    Home Medications Prior to Admission medications   Medication Sig Start Date End Date Taking? Authorizing Provider  albuterol (PROVENTIL HFA;VENTOLIN HFA) 108 (90 BASE) MCG/ACT inhaler Inhale 2 puffs into the lungs every 6 (six) hours as needed for wheezing or shortness of breath. 07/16/15  Yes Pucilowska, Jolanta B, MD  benztropine (COGENTIN) 1 MG tablet Take 1 tablet (1 mg total) by mouth 3 (three) times daily. 07/16/15  Yes Pucilowska, Jolanta B, MD  cyclobenzaprine (FLEXERIL) 10 MG tablet Take 10 mg by mouth at bedtime. 08/31/20  Yes [provider]  haloperidol (HALDOL) 5 MG  tablet Take 1 tablet (5 mg total) by mouth 2 (two) times daily. 07/16/15  Yes Pucilowska, Jolanta B, MD  levothyroxine (SYNTHROID, LEVOTHROID) 100 MCG tablet Take 1 tablet (100 mcg total) by mouth daily before breakfast. Patient taking differently: Take 150 mcg by mouth daily before breakfast. 07/16/15  Yes Pucilowska, Jolanta B, MD  metoprolol tartrate (LOPRESSOR) 25 MG tablet Take 1 tablet (25 mg total) by mouth daily. 07/16/15  Yes Pucilowska, Jolanta B, MD  QUEtiapine (SEROQUEL) 100 MG tablet Take 100 mg by mouth at bedtime. 08/19/20  Yes [provider]   traZODone (DESYREL) 150 MG tablet Take 1 tablet (150 mg total) by mouth at bedtime. 07/16/15  Yes Pucilowska, Jolanta B, MD  divalproex (DEPAKOTE) 500 MG DR tablet Take 4 tablets (2,000 mg total) by mouth at bedtime. Patient not taking: No sig reported 07/16/15   Pucilowska, Jolanta B, MD  fenofibrate 160 MG tablet Take 1 tablet (160 mg total) by mouth every morning. Patient not taking: No sig reported 07/16/15   Pucilowska, Jolanta B, MD  FLUoxetine (PROZAC) 20 MG capsule Take 3 capsules (60 mg total) by mouth every morning. Patient not taking: No sig reported 07/16/15   Pucilowska, Jolanta B, MD  haloperidol decanoate (HALDOL DECANOATE) 100 MG/ML injection Inject 2 mLs (200 mg total) into the muscle every 28 (twenty-eight) days. Patient not taking: No sig reported 07/16/15   Pucilowska, Jolanta B, MD  hydrOXYzine (ATARAX/VISTARIL) 25 MG tablet Take 1 tablet (25 mg total) by mouth 3 (three) times daily. Patient not taking: No sig reported 07/16/15   Pucilowska, Jolanta B, MD  LORazepam (ATIVAN) 0.5 MG tablet Take 1 tablet (0.5 mg total) by mouth 3 (three) times daily. Patient not taking: No sig reported 07/16/15   Pucilowska, Jolanta B, MD  naproxen (NAPROSYN) 500 MG tablet Take 500 mg by mouth 2 (two) times daily. 08/31/20   [provider]    Allergies    Patient has no known allergies.  Review of Systems   Review of Systems  All other systems reviewed and are negative.   Physical Exam Updated Vital Signs Ht 1.803 m (5\' 11" )   Wt 125 kg   BMI 38.43 kg/m   Physical Exam Vitals and nursing note reviewed.  Constitutional:      General: He is not in acute distress.    Appearance: He is well-developed and well-nourished.  HENT:     Head: Normocephalic and atraumatic.     Mouth/Throat:     Mouth: Oropharynx is clear and moist.     Pharynx: No oropharyngeal exudate.  Eyes:     General: No scleral icterus.       Right eye: No discharge.        Left eye: No discharge.      Extraocular Movements: EOM normal.     Conjunctiva/sclera: Conjunctivae normal.     Pupils: Pupils are equal, round, and reactive to light.  Neck:     Thyroid: No thyromegaly.     Vascular: No JVD.  Cardiovascular:     Rate and Rhythm: Regular rhythm. Tachycardia present.     Pulses: Intact distal pulses.     Heart sounds: Normal heart sounds. No murmur heard. No friction rub. No gallop.      Comments: Sinus tachycardia with a rate of 105 to 115 bpm, no A. fib Pulmonary:     Effort: Pulmonary effort is normal. No respiratory distress.     Breath sounds: Normal breath sounds. No wheezing or  rales.  Abdominal:     General: Bowel sounds are normal. There is no distension.     Palpations: Abdomen is soft. There is no mass.     Tenderness: There is no abdominal tenderness.  Musculoskeletal:        General: No tenderness or edema. Normal range of motion.     Cervical back: Normal range of motion and neck supple.     Right lower leg: No edema.     Left lower leg: No edema.  Lymphadenopathy:     Cervical: No cervical adenopathy.  Skin:    General: Skin is warm and dry.     Findings: No erythema or rash.  Neurological:     Mental Status: He is alert.     Coordination: Coordination normal.  Psychiatric:        Mood and Affect: Mood and affect normal.        Behavior: Behavior normal.     ED Results / Procedures / Treatments   Labs (all labs ordered are listed, but only abnormal results are displayed) Labs Reviewed  TSH - Abnormal; Notable for the following components:      Result Value   TSH 26.090 (*)    All other components within normal limits  COMPREHENSIVE METABOLIC PANEL - Abnormal; Notable for the following components:   Glucose, Bld 111 (*)    ALT 54 (*)    All other components within normal limits  D-DIMER, QUANTITATIVE (NOT AT [facility])  CBC    EKG EKG Interpretation  Date/Time:  Thursday October 01 2020 18:55:09 EST Ventricular Rate:  105 PR Interval:     QRS Duration: 93 QT Interval:  312 QTC Calculation: 413 R Axis:   56 Text Interpretation: Sinus tachycardia Since last tracing rate faster Confirmed by Eber Hong ([id number]) on 10/01/2020 7:02:11 PM   Radiology DG Chest Port 1 View  Result Date: 10/01/2020 CLINICAL DATA:  Chest pain with palpitations. EXAM: PORTABLE CHEST 1 VIEW COMPARISON:  Radiograph 03/02/2012 FINDINGS: Remote median sternotomy. The heart is normal in size. Unchanged mediastinal contours. No pulmonary edema. No focal airspace disease, pleural effusion or pneumothorax. No acute osseous abnormalities are seen. IMPRESSION: No acute chest finding. Remote median sternotomy. Electronically Signed   By: Narda Rutherford M.D.   On: 10/01/2020 19:23    Procedures Procedures (including critical care time)  Medications Ordered in ED Medications - No data to display  ED Course  I have reviewed the triage vital signs and the nursing notes.  Pertinent labs & imaging results that were available during my care of the patient were reviewed by me and considered in my medical decision making (see chart for details).    MDM Rules/Calculators/A&P                          This patient is well-appearing other than a mild tachycardia.  He did have acute onset of shortness of breath with palpitations raising suspicion for possible blood clot but at this time he does not appear to be in atrial fibrillation, SVT or other abnormal pathological tacky arrhythmia.  He is normotensive, afebrile and has normal oxygen level.  We will check a D-dimer as well as basic labs, I will also add a TSH since he states that he has been having increased amounts of palpitations since his Synthroid was increased a month ago.  Patient has had no further symptoms while in the emergency department, his D-dimer was negative,  labs are unremarkable except for his TSH which is elevated and which is likely why he was increased on his Synthroid.  At this point he can  follow-up in the outpatient setting, his EKG is nonischemic and shows no signs of pathological arrhythmia.  The patient was given these results and is agreeable to the plan  Final Clinical Impression(s) / ED Diagnoses Final diagnoses:  Chest pain, central  Palpitations  Hypothyroidism, unspecified type    Rx / DC Orders ED Discharge Orders    None       Eber Hong, MD 10/01/20 2101

## 2020-10-01 NOTE — ED Triage Notes (Signed)
EMS gave 324mg ASA

## 2020-10-01 NOTE — Discharge Instructions (Signed)
Your thyroid testing still appears to be too low.  You will need to follow-up with your family doctor within the next month to make sure that this test gets rechecked to make sure that your medications are being altered appropriately.  Your testing tonight did not show any specific abnormalities that would have caused chest pain or difficulty breathing or palpitations.  Please make sure that you are avoiding caffeine, stimulant medications, over-the-counter medications and drinking plenty of clear liquids.  Please return to the emergency department for severe or worsening symptoms and if you would like to see a local cardiologist please see the phone number above

## 2020-12-01 ENCOUNTER — Encounter (HOSPITAL_COMMUNITY): Payer: Self-pay | Admitting: Emergency Medicine

## 2020-12-01 ENCOUNTER — Emergency Department (HOSPITAL_COMMUNITY)
Admission: EM | Admit: 2020-12-01 | Discharge: 2020-12-01 | Disposition: A | Discharge status: Home / Self Care | Payer: Federal, State, Local not specified - PPO | Attending: Emergency Medicine | Admitting: Emergency Medicine

## 2020-12-01 ENCOUNTER — Other Ambulatory Visit: Payer: Self-pay

## 2020-12-01 DIAGNOSIS — E039 Hypothyroidism, unspecified: Secondary | ICD-10-CM | POA: Diagnosis not present

## 2020-12-01 DIAGNOSIS — F17210 Nicotine dependence, cigarettes, uncomplicated: Secondary | ICD-10-CM | POA: Insufficient documentation

## 2020-12-01 DIAGNOSIS — F209 Schizophrenia, unspecified: Secondary | ICD-10-CM | POA: Diagnosis not present

## 2020-12-01 DIAGNOSIS — Z20822 Contact with and (suspected) exposure to covid-19: Secondary | ICD-10-CM | POA: Insufficient documentation

## 2020-12-01 DIAGNOSIS — I10 Essential (primary) hypertension: Secondary | ICD-10-CM | POA: Diagnosis not present

## 2020-12-01 DIAGNOSIS — Z79899 Other long term (current) drug therapy: Secondary | ICD-10-CM | POA: Diagnosis not present

## 2020-12-01 DIAGNOSIS — R45851 Suicidal ideations: Secondary | ICD-10-CM | POA: Diagnosis not present

## 2020-12-01 DIAGNOSIS — F32A Depression, unspecified: Secondary | ICD-10-CM | POA: Diagnosis present

## 2020-12-01 DIAGNOSIS — Z008 Encounter for other general examination: Secondary | ICD-10-CM

## 2020-12-01 LAB — ACETAMINOPHEN LEVEL: Acetaminophen (Tylenol), Serum: <10 ug/mL — ABNORMAL LOW (ref 10–30)

## 2020-12-01 LAB — COMPREHENSIVE METABOLIC PANEL
ALT: 53 U/L — ABNORMAL HIGH (ref 0–44)
AST: 22 U/L (ref 15–41)
Albumin: 4.2 g/dL (ref 3.5–5.0)
Alkaline Phosphatase: 102 U/L (ref 38–126)
Anion gap: 10 (ref 5–15)
BUN: 11 mg/dL (ref 6–20)
CO2: 24 mmol/L (ref 22–32)
Calcium: 9.4 mg/dL (ref 8.9–10.3)
Chloride: 104 mmol/L (ref 98–111)
Creatinine, Ser: 0.77 mg/dL (ref 0.61–1.24)
GFR, Estimated: >60 mL/min (ref >60)
Glucose, Bld: 97 mg/dL (ref 70–99)
Potassium: 4.4 mmol/L (ref 3.5–5.1)
Sodium: 138 mmol/L (ref 135–145)
Total Bilirubin: 0.5 mg/dL (ref 0.3–1.2)
Total Protein: 7.6 g/dL (ref 6.5–8.1)

## 2020-12-01 LAB — CBC
HCT: 48.2 % (ref 39.0–52.0)
Hemoglobin: 16.3 g/dL (ref 13.0–17.0)
MCH: 31 pg (ref 26.0–34.0)
MCHC: 33.8 g/dL (ref 30.0–36.0)
MCV: 91.6 fL (ref 80.0–100.0)
Platelets: 281 10*3/uL (ref 150–400)
RBC: 5.26 MIL/uL (ref 4.22–5.81)
RDW: 13.7 % (ref 11.5–15.5)
WBC: 15.9 10*3/uL — ABNORMAL HIGH (ref 4.0–10.5)
nRBC: 0 % (ref 0.0–0.2)

## 2020-12-01 LAB — RAPID URINE DRUG SCREEN, HOSP PERFORMED
Amphetamines: NOT DETECTED
Barbiturates: NOT DETECTED
Benzodiazepines: NOT DETECTED
Cocaine: NOT DETECTED
Opiates: NOT DETECTED
Tetrahydrocannabinol: POSITIVE — AB

## 2020-12-01 LAB — ETHANOL: Alcohol, Ethyl (B): <10 mg/dL (ref <10)

## 2020-12-01 LAB — RESP PANEL BY RT-PCR (FLU A&B, COVID) ARPGX2
Influenza A by PCR: NEGATIVE
Influenza B by PCR: NEGATIVE
SARS Coronavirus 2 by RT PCR: NEGATIVE

## 2020-12-01 LAB — SALICYLATE LEVEL: Salicylate Lvl: <7 mg/dL — ABNORMAL LOW (ref 7.0–30.0)

## 2020-12-01 LAB — TSH: TSH: 8.281 u[IU]/mL — ABNORMAL HIGH (ref 0.350–4.500)

## 2020-12-01 MED ORDER — BENZTROPINE MESYLATE 1 MG PO TABS
1.0000 mg | ORAL_TABLET | Freq: Three times a day (TID) | ORAL | Status: DC
  Administered 2020-12-01: 1 mg via ORAL
  Filled 2020-12-01: qty 1

## 2020-12-01 MED ORDER — METOPROLOL TARTRATE 25 MG PO TABS
25.0000 mg | ORAL_TABLET | Freq: Every day | ORAL | Status: DC
Start: 2020-12-01 — End: 2020-12-02
  Administered 2020-12-01: 25 mg via ORAL
  Filled 2020-12-01: qty 1

## 2020-12-01 MED ORDER — ONDANSETRON HCL 4 MG PO TABS: 4.0000 mg | ORAL_TABLET | Freq: Three times a day (TID) | ORAL | Status: DC | PRN

## 2020-12-01 MED ORDER — ALUM & MAG HYDROXIDE-SIMETH 200-200-20 MG/5ML PO SUSP: 30.0000 mL | Freq: Four times a day (QID) | ORAL | Status: DC | PRN

## 2020-12-01 MED ORDER — ZOLPIDEM TARTRATE 5 MG PO TABS: 5.0000 mg | ORAL_TABLET | Freq: Every evening | ORAL | Status: DC | PRN

## 2020-12-01 MED ORDER — ACETAMINOPHEN 325 MG PO TABS: 650.0000 mg | ORAL_TABLET | ORAL | Status: DC | PRN

## 2020-12-01 MED ORDER — LEVOTHYROXINE SODIUM 50 MCG PO TABS: 150.0000 ug | ORAL_TABLET | Freq: Every day | ORAL | Status: DC

## 2020-12-01 MED ORDER — ALBUTEROL SULFATE HFA 108 (90 BASE) MCG/ACT IN AERS: 2.0000 | INHALATION_SPRAY | Freq: Four times a day (QID) | RESPIRATORY_TRACT | Status: DC | PRN

## 2020-12-01 NOTE — ED Triage Notes (Signed)
Pt arrived by [facility] for hearing voices telling him to harm himself.
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
## 2020-12-01 NOTE — Discharge Instructions (Signed)
You were seen in the emergency department today due to hallucinations and depression.  Your blood work showed that your TSH (thyroid) test was mildly elevated, this is better than it was in the past, your white blood cell count was also high, please have these rechecked closely by your primary care provider.  Please take all your medications as prescribed.  Please follow-up with your ACCT team.  Please see additional resources.  Return to the ER for new or worsening symptoms including but not limited to thoughts of harming yourself, thoughts of harming others, hearing/seeing things other people do not hear or see, or any other concerns.

## 2020-12-01 NOTE — ED Notes (Signed)
PT states he wishes to leave. PA samantha made aware. [facility] contacted to see if pt TTS can be done soon. PER [facility] they will have a counselor call this nurse back with a time they can do it.

## 2020-12-01 NOTE — ED Notes (Signed)
Pt wanded by security and pt dressed out in purple scrubs. Belongings taken to locker room and locked up by [employer] NT.

## 2020-12-01 NOTE — ED Provider Notes (Signed)
[facility] EMERGENCY DEPARTMENT Provider Note   CSN: [id number] Arrival date & time: 12/01/20  1341     History Chief Complaint  Patient presents with  . Medical Clearance    [name] is a 30 y.o. male with a hx of hypertension, hypothyroidism, hypercholesterolemia, bipolar disorder, & schizophrenia who presents to the ED via EMS with complaints of hallucinations & depression. Patient reports auditory hallucinations, hearing voices telling him to harm himself, has had increased depression, and thoughts of harming himself. No specific plan/attempt. No specific alleviating/aggravating factors. States his mother died, but denies recent triggers/life changes. He denies HI or visual hallucinations. Denies alcohol use. Utilized marijuana, denies additional drug use. Denies attempted suicide/self harm.   HPI     Past Medical History:  Diagnosis Date  . Bipolar disorder (HCC)   . Depression   . Hypercholesterolemia   . Hypertension   . Hypothyroidism   . Schizophrenia [facility])     Patient Active Problem List   Diagnosis Date Noted  . Tobacco use disorder 07/13/2015  . Hypothyroidism 07/13/2015  . Dyslipidemia 07/13/2015  . HTN (hypertension) 07/13/2015  . Schizophrenia, paranoid (HCC) 07/13/2015  . Cannabis use disorder, moderate, dependence (HCC) 11/17/2014    Past Surgical History:  Procedure Laterality Date  . CARDIAC SURGERY     for Epstein's abnormality at 30yo  . TONSILLECTOMY         History reviewed. No pertinent family history.  Social History   Tobacco Use  . Smoking status: Current Every Day Smoker    Packs/day: 1.00    Types: Cigarettes  . Smokeless tobacco: Never Used  Vaping Use  . Vaping Use: Never used  Substance Use Topics  . Alcohol use: No    Comment: occ.  . Drug use: Yes    Frequency: 2.0 times per week    Types: Marijuana    Comment: occ- last used 09/19/2020    Home Medications Prior to Admission medications   Medication  Sig Start Date End Date Taking? Authorizing Provider  albuterol (PROVENTIL HFA;VENTOLIN HFA) 108 (90 BASE) MCG/ACT inhaler Inhale 2 puffs into the lungs every 6 (six) hours as needed for wheezing or shortness of breath. 07/16/15   Pucilowska, Jolanta B, MD  benztropine (COGENTIN) 1 MG tablet Take 1 tablet (1 mg total) by mouth 3 (three) times daily. 07/16/15   Pucilowska, Jolanta B, MD  cyclobenzaprine (FLEXERIL) 10 MG tablet Take 10 mg by mouth at bedtime. 08/31/20   [provider]  divalproex (DEPAKOTE) 500 MG DR tablet Take 4 tablets (2,000 mg total) by mouth at bedtime. Patient not taking: No sig reported 07/16/15   Pucilowska, Jolanta B, MD  fenofibrate 160 MG tablet Take 1 tablet (160 mg total) by mouth every morning. Patient not taking: No sig reported 07/16/15   Pucilowska, Jolanta B, MD  FLUoxetine (PROZAC) 20 MG capsule Take 3 capsules (60 mg total) by mouth every morning. Patient not taking: No sig reported 07/16/15   Pucilowska, Jolanta B, MD  haloperidol (HALDOL) 5 MG tablet Take 1 tablet (5 mg total) by mouth 2 (two) times daily. 07/16/15   Pucilowska, Jolanta B, MD  haloperidol decanoate (HALDOL DECANOATE) 100 MG/ML injection Inject 2 mLs (200 mg total) into the muscle every 28 (twenty-eight) days. Patient not taking: No sig reported 07/16/15   Pucilowska, Jolanta B, MD  hydrOXYzine (ATARAX/VISTARIL) 25 MG tablet Take 1 tablet (25 mg total) by mouth 3 (three) times daily. Patient not taking: No sig reported 07/16/15  Pucilowska, Jolanta B, MD  levothyroxine (SYNTHROID, LEVOTHROID) 100 MCG tablet Take 1 tablet (100 mcg total) by mouth daily before breakfast. Patient taking differently: Take 150 mcg by mouth daily before breakfast. 07/16/15   Pucilowska, Jolanta B, MD  LORazepam (ATIVAN) 0.5 MG tablet Take 1 tablet (0.5 mg total) by mouth 3 (three) times daily. Patient not taking: No sig reported 07/16/15   Pucilowska, Jolanta B, MD  metoprolol tartrate (LOPRESSOR) 25 MG  tablet Take 1 tablet (25 mg total) by mouth daily. 07/16/15   Pucilowska, Jolanta B, MD  naproxen (NAPROSYN) 500 MG tablet Take 500 mg by mouth 2 (two) times daily. 08/31/20   [provider]  QUEtiapine (SEROQUEL) 100 MG tablet Take 100 mg by mouth at bedtime. 08/19/20   [provider]  traZODone (DESYREL) 150 MG tablet Take 1 tablet (150 mg total) by mouth at bedtime. 07/16/15   Pucilowska, Ellin Goodie, MD    Allergies    Patient has no known allergies.  Review of Systems   Review of Systems  Constitutional: Negative for chills and fever.  Respiratory: Negative for shortness of breath.   Cardiovascular: Negative for chest pain.  Gastrointestinal: Negative for abdominal pain.  Neurological: Negative for syncope.  Psychiatric/Behavioral: Positive for hallucinations and suicidal ideas.  All other systems reviewed and are negative.   Physical Exam Updated Vital Signs BP (!) 143/95 (BP Location: Right Arm)   Pulse (!) 101   Temp 98.1 F (36.7 C) (Oral)   Resp 20   Ht 5\' 11"  (1.803 m)   Wt 125 kg   SpO2 97%   BMI 38.43 kg/m   Physical Exam Vitals and nursing note reviewed.  Constitutional:      General: He is not in acute distress.    Appearance: He is well-developed. He is not toxic-appearing.  HENT:     Head: Normocephalic and atraumatic.  Eyes:     General:        Right eye: No discharge.        Left eye: No discharge.     Conjunctiva/sclera: Conjunctivae normal.  Cardiovascular:     Rate and Rhythm: Normal rate and regular rhythm.  Pulmonary:     Effort: Pulmonary effort is normal. No respiratory distress.     Breath sounds: Normal breath sounds. No wheezing, rhonchi or rales.  Abdominal:     General: There is no distension.     Palpations: Abdomen is soft.     Tenderness: There is no abdominal tenderness.  Musculoskeletal:     Cervical back: Neck supple.  Skin:    General: Skin is warm and dry.     Findings: No rash.  Neurological:      Mental Status: He is alert.     Comments: Clear speech.   Psychiatric:        Thought Content: Thought content includes suicidal ideation. Thought content does not include homicidal ideation. Thought content does not include homicidal or suicidal plan.     ED Results / Procedures / Treatments   Labs (all labs ordered are listed, but only abnormal results are displayed) Labs Reviewed  COMPREHENSIVE METABOLIC PANEL - Abnormal; Notable for the following components:      Result Value   ALT 53 (*)    All other components within normal limits  CBC - Abnormal; Notable for the following components:   WBC 15.9 (*)    All other components within normal limits  ACETAMINOPHEN LEVEL - Abnormal; Notable for the following  components:   Acetaminophen (Tylenol), Serum <10 (*)    All other components within normal limits  SALICYLATE LEVEL - Abnormal; Notable for the following components:   Salicylate Lvl <7.0 (*)    All other components within normal limits  RAPID URINE DRUG SCREEN, HOSP PERFORMED - Abnormal; Notable for the following components:   Tetrahydrocannabinol POSITIVE (*)    All other components within normal limits  TSH - Abnormal; Notable for the following components:   TSH 8.281 (*)    All other components within normal limits  RESP PANEL BY RT-PCR (FLU A&B, COVID) [id number]  ETHANOL    EKG None  Radiology No results found.  Procedures Procedures   Medications Ordered in ED Medications - No data to display  ED Course  I have reviewed the triage vital signs and the nursing notes.  Pertinent labs & imaging results that were available during my care of the patient were reviewed by me and considered in my medical decision making (see chart for details).    MDM Rules/Calculators/A&P                         Patient presents to the ED with complaints of SI/hallucinations.  Mildly tachycardic/hypertensive- doubt HTN emergency.   Additional history obtained:  Additional  history obtained from chart review & nursing note review.   Lab Tests:  I Ordered, reviewed, and interpreted labs, which included:  CBC, CMP, acetaminophen/salicylate/ethanol levels, TSH- TSH somewhat elevated but improved from most recent on record.  UDS/COVID pending.   ED Course:  Patient medically cleared. Consult placed to TTS. Disposition per [facility].   16:20: RE-EVAL: Patient reporting he would like to go home, states he took his medications this morning and no longer wishes to hurt himself and is not hallucinating. States he has things he has to do and needs to prepare for his [employer]. Encouraged him to remain in the ED to discuss with behavioral health which he was ultimately agreeable to. Called TTS to try to facilitate sooner evaluation.   20:15: Discussed with TTS- remains pending recommendations.   Patient evaluated to TTS- recommendation for discharge home. Patient again denies SI or hallucinations to me. Will discharge home at this time. I discussed results, treatment plan, need for follow-up, and return precautions with the patient. Provided opportunity for questions, patient confirmed understanding and is in agreement with plan.   Portions of this note were generated with Scientist, clinical (histocompatibility and immunogenetics). Dictation errors may occur despite best attempts at proofreading.  Final Clinical Impression(s) / ED Diagnoses Final diagnoses:  None    Rx / DC Orders ED Discharge Orders    None       Desmond Lope 12/01/20 2107    Maia Plan, MD 12/02/20 1139

## 2020-12-07 DIAGNOSIS — R45851 Suicidal ideations: Secondary | ICD-10-CM

## 2021-01-12 ENCOUNTER — Other Ambulatory Visit: Payer: Self-pay

## 2021-01-12 ENCOUNTER — Emergency Department (HOSPITAL_COMMUNITY): Payer: Federal, State, Local not specified - PPO

## 2021-01-12 ENCOUNTER — Emergency Department (HOSPITAL_COMMUNITY)
Admission: EM | Admit: 2021-01-12 | Discharge: 2021-01-14 | Disposition: A | Discharge status: Home / Self Care | Payer: Federal, State, Local not specified - PPO | Attending: Emergency Medicine | Admitting: Emergency Medicine

## 2021-01-12 ENCOUNTER — Encounter (HOSPITAL_COMMUNITY): Payer: Self-pay | Admitting: Emergency Medicine

## 2021-01-12 DIAGNOSIS — F17210 Nicotine dependence, cigarettes, uncomplicated: Secondary | ICD-10-CM | POA: Diagnosis not present

## 2021-01-12 DIAGNOSIS — Y92009 Unspecified place in unspecified non-institutional (private) residence as the place of occurrence of the external cause: Secondary | ICD-10-CM | POA: Diagnosis not present

## 2021-01-12 DIAGNOSIS — R440 Auditory hallucinations: Secondary | ICD-10-CM

## 2021-01-12 DIAGNOSIS — E039 Hypothyroidism, unspecified: Secondary | ICD-10-CM | POA: Insufficient documentation

## 2021-01-12 DIAGNOSIS — S61512A Laceration without foreign body of left wrist, initial encounter: Secondary | ICD-10-CM | POA: Insufficient documentation

## 2021-01-12 DIAGNOSIS — Z20822 Contact with and (suspected) exposure to covid-19: Secondary | ICD-10-CM | POA: Insufficient documentation

## 2021-01-12 DIAGNOSIS — Z046 Encounter for general psychiatric examination, requested by authority: Secondary | ICD-10-CM | POA: Diagnosis present

## 2021-01-12 DIAGNOSIS — X781XXA Intentional self-harm by knife, initial encounter: Secondary | ICD-10-CM | POA: Insufficient documentation

## 2021-01-12 DIAGNOSIS — I10 Essential (primary) hypertension: Secondary | ICD-10-CM | POA: Insufficient documentation

## 2021-01-12 DIAGNOSIS — Z79899 Other long term (current) drug therapy: Secondary | ICD-10-CM | POA: Insufficient documentation

## 2021-01-12 DIAGNOSIS — X789XXA Intentional self-harm by unspecified sharp object, initial encounter: Secondary | ICD-10-CM

## 2021-01-12 DIAGNOSIS — F250 Schizoaffective disorder, bipolar type: Secondary | ICD-10-CM | POA: Diagnosis not present

## 2021-01-12 LAB — CBC WITH DIFFERENTIAL/PLATELET
Abs Immature Granulocytes: 0.06 10*3/uL (ref 0.00–0.07)
Basophils Absolute: 0.1 10*3/uL (ref 0.0–0.1)
Basophils Relative: 1 %
Eosinophils Absolute: 0.1 10*3/uL (ref 0.0–0.5)
Eosinophils Relative: 1 %
HCT: 47.2 % (ref 39.0–52.0)
Hemoglobin: 16 g/dL (ref 13.0–17.0)
Immature Granulocytes: 0 %
Lymphocytes Relative: 10 %
Lymphs Abs: 1.5 10*3/uL (ref 0.7–4.0)
MCH: 30.5 pg (ref 26.0–34.0)
MCHC: 33.9 g/dL (ref 30.0–36.0)
MCV: 89.9 fL (ref 80.0–100.0)
Monocytes Absolute: 1 10*3/uL (ref 0.1–1.0)
Monocytes Relative: 7 %
Neutro Abs: 11.7 10*3/uL — ABNORMAL HIGH (ref 1.7–7.7)
Neutrophils Relative %: 81 %
Platelets: 241 10*3/uL (ref 150–400)
RBC: 5.25 MIL/uL (ref 4.22–5.81)
RDW: 13.1 % (ref 11.5–15.5)
WBC: 14.4 10*3/uL — ABNORMAL HIGH (ref 4.0–10.5)
nRBC: 0 % (ref 0.0–0.2)

## 2021-01-12 LAB — RAPID URINE DRUG SCREEN, HOSP PERFORMED
Amphetamines: NOT DETECTED
Barbiturates: NOT DETECTED
Benzodiazepines: NOT DETECTED
Cocaine: NOT DETECTED
Opiates: NOT DETECTED
Tetrahydrocannabinol: POSITIVE — AB

## 2021-01-12 LAB — COMPREHENSIVE METABOLIC PANEL
ALT: 65 U/L — ABNORMAL HIGH (ref 0–44)
AST: 27 U/L (ref 15–41)
Albumin: 4.3 g/dL (ref 3.5–5.0)
Alkaline Phosphatase: 105 U/L (ref 38–126)
Anion gap: 10 (ref 5–15)
BUN: 10 mg/dL (ref 6–20)
CO2: 23 mmol/L (ref 22–32)
Calcium: 9.4 mg/dL (ref 8.9–10.3)
Chloride: 102 mmol/L (ref 98–111)
Creatinine, Ser: 0.71 mg/dL (ref 0.61–1.24)
GFR, Estimated: >60 mL/min (ref >60)
Glucose, Bld: 113 mg/dL — ABNORMAL HIGH (ref 70–99)
Potassium: 3.8 mmol/L (ref 3.5–5.1)
Sodium: 135 mmol/L (ref 135–145)
Total Bilirubin: 0.7 mg/dL (ref 0.3–1.2)
Total Protein: 7.4 g/dL (ref 6.5–8.1)

## 2021-01-12 LAB — ETHANOL: Alcohol, Ethyl (B): <10 mg/dL (ref <10)

## 2021-01-12 MED ORDER — IBUPROFEN 400 MG PO TABS
600.0000 mg | ORAL_TABLET | Freq: Once | ORAL | Status: AC
  Administered 2021-01-12: 600 mg via ORAL
  Filled 2021-01-12: qty 2

## 2021-01-12 MED ORDER — ZIPRASIDONE MESYLATE 20 MG IM SOLR
INTRAMUSCULAR | Status: AC
  Administered 2021-01-12: 10 mg via INTRAMUSCULAR
  Filled 2021-01-12: qty 20

## 2021-01-12 MED ORDER — ZIPRASIDONE MESYLATE 20 MG IM SOLR: 10.0000 mg | Freq: Once | INTRAMUSCULAR | Status: AC

## 2021-01-12 MED ORDER — LIDOCAINE-EPINEPHRINE-TETRACAINE (LET) TOPICAL GEL
3.0000 mL | Freq: Once | TOPICAL | Status: AC
  Administered 2021-01-12: 3 mL via TOPICAL
  Filled 2021-01-12: qty 3
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
## 2021-01-12 NOTE — ED Notes (Signed)
Pt refused hand xray, states "It's not broke". Has full rom, no active bleeding, superficial lacerations noted. Dr. Estell Harpin notified.

## 2021-01-12 NOTE — ED Notes (Signed)
Patient to be held in ED for overnight observation.

## 2021-01-12 NOTE — ED Notes (Signed)
Patient to family room for TTS assessment at this time.
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
## 2021-01-12 NOTE — ED Triage Notes (Signed)
Pt brought in by [facility] today emergently for call from his ACT team member due to patient was noted to be cutting his wrists bilaterally superficially with a knife. PT denies SI/HI on arrival but is guarded and only answering yes or no questions.

## 2021-01-12 NOTE — ED Notes (Signed)
By the end of triaging patient more cooperative and stating "every 2 weeks the ACT team comes to give me a haldol shot but the voices have gotten worse before the two weeks are up."

## 2021-01-12 NOTE — ED Notes (Signed)
Personal belongings removed from patient and placed in belongings bag, patient placed in facility scrubs and wanded by security. [place] PD remain at bedside at this time. Patient cooperative with staff.

## 2021-01-13 MED ORDER — DIVALPROEX SODIUM ER 500 MG PO TB24
500.0000 mg | ORAL_TABLET | Freq: Three times a day (TID) | ORAL | Status: DC
  Administered 2021-01-14 (×2): 500 mg via ORAL
  Filled 2021-01-13 (×2): qty 1

## 2021-01-13 MED ORDER — ZIPRASIDONE MESYLATE 20 MG IM SOLR
20.0000 mg | Freq: Once | INTRAMUSCULAR | Status: AC
  Administered 2021-01-13: 20 mg via INTRAMUSCULAR
  Filled 2021-01-13: qty 20

## 2021-01-13 MED ORDER — BENZTROPINE MESYLATE 1 MG PO TABS
1.0000 mg | ORAL_TABLET | Freq: Three times a day (TID) | ORAL | Status: DC
  Administered 2021-01-14 (×2): 1 mg via ORAL
  Filled 2021-01-13 (×2): qty 1

## 2021-01-13 MED ORDER — FLUOXETINE HCL 20 MG PO CAPS
60.0000 mg | ORAL_CAPSULE | Freq: Every morning | ORAL | Status: DC
  Administered 2021-01-14: 60 mg via ORAL
  Filled 2021-01-13: qty 3

## 2021-01-13 MED ORDER — METOPROLOL TARTRATE 25 MG PO TABS
25.0000 mg | ORAL_TABLET | Freq: Every day | ORAL | Status: DC
  Administered 2021-01-14: 25 mg via ORAL
  Filled 2021-01-13: qty 1

## 2021-01-13 MED ORDER — LORAZEPAM 1 MG PO TABS
1.0000 mg | ORAL_TABLET | Freq: Once | ORAL | Status: AC
  Administered 2021-01-13: 1 mg via ORAL
  Filled 2021-01-13: qty 1

## 2021-01-13 MED ORDER — LEVOTHYROXINE SODIUM 50 MCG PO TABS
100.0000 ug | ORAL_TABLET | Freq: Every day | ORAL | Status: DC
  Administered 2021-01-14: 100 ug via ORAL
  Filled 2021-01-13: qty 2

## 2021-01-13 NOTE — ED Notes (Signed)
Pt increasingly agitated, pacing in room, carrying on full conversations with no one in the room, laughing and responding to internal stimuli. Pt impulsive moving out of room to bathroom. Standing in doorway assessing department. Dr. Blinda Leatherwood notified.

## 2021-01-13 NOTE — ED Notes (Signed)
VS updated, TTS monitor placed in room awaiting provider to come on-screen

## 2021-01-13 NOTE — ED Notes (Signed)
Pt has not had any PTA meds ordered,pt requesting ibuprofen as well, EDP made aware, orders to be placed.

## 2021-01-13 NOTE — ED Notes (Signed)
Pt in room pacing back and forth talking to self

## 2021-01-13 NOTE — ED Notes (Signed)
Pt ambulatory to bathroom. Steady gait. Pt back in room at this time

## 2021-01-13 NOTE — BH Assessment (Signed)
Patient reassessed at 22:14 by this clinician.  Pt says he was brought to [facility] on IVC.  He says he had cut himself with a dull paring knife.  He said he was responding to voices telling him to cut himself  Pt says "I wasn't trying to kill myself."    Pt has ACTT team services from [facility] (out of [place]).  He says he has an appointment with with psychiatrist with ACTT team tomorrow (04/28).  Appointment is at 11:30 and he has medicaid funded transportation.  He had a Haldol shot yesterday from his ACTT team.  He says that it took awhile for the medicine to kick in.    Patient admits that he did wrong by punching a glass window in the ED.  He says he regrets doing so.  Pt is currently denying SI, HI.  He denies hearing voices and he says "I'm not seeing anything or hearing nothing."  Pt lives alone and his parents are 15 minutes away.  He says his dad comes by the house very often.    Clinician discussed patient with Liborio Nixon, NP.  She said she did not feel comfortable psych clearing patient.  She recommended provider reassessment in AM.  Maybe in enough time to hve him makd it to his 11:30 appointment.    Clinician informed Dr. Effie Shy of the recommendation via telephone call.

## 2021-01-13 NOTE — ED Notes (Addendum)
Asked TTS (kellice meadows) about when pt would be TTS, she states they dont have pt up for re TTS just for reassessment and the provider will call once hes ready.

## 2021-01-13 NOTE — ED Notes (Signed)
Pt states he is feeing agitated and needs something for his nerves. MD made aware.

## 2021-01-13 NOTE — ED Provider Notes (Addendum)
  Physical Exam  BP (!) 138/103 (BP Location: Left Arm)   Pulse 83   Temp 98.1 F (36.7 C) (Oral)   Resp 18   Ht 5\' 10"  (1.778 m)   Wt 127.5 kg   SpO2 98%   BMI 40.32 kg/m   Physical Exam  ED Course/Procedures     Procedures  MDM  The patient's neighbor in the next room has become more agitated yelling and screaming.  This has affected the patient.  Requesting something to help with anxiety.  We will give some oral Ativan       , MD 01/13/21 1038  Patient has now been reevaluated by psychiatry and cleared for discharge    01/15/21, MD 01/14/21 [phone]

## 2021-01-14 LAB — RESP PANEL BY RT-PCR (FLU A&B, COVID) ARPGX2
Influenza A by PCR: NEGATIVE
Influenza B by PCR: NEGATIVE
SARS Coronavirus 2 by RT PCR: NEGATIVE

## 2021-01-14 NOTE — Consult Note (Signed)
Telepsych Consultation   Reason for Consult:  Psychiatric evaluation due auditory hallucinations and trying to cut wrists Referring Physician:  Benjiman CoreNathan Pickering  Location of Patient: [facility] Location of Provider: [facility]  Patient Identification: Richard HillockChristopher L Gonzales MRN:  [id number] Principal Diagnosis: Schizoaffective disorder, bipolar type (HCC) Diagnosis:  Principal Problem:   Schizoaffective disorder, bipolar type (HCC)   Total Time spent with patient: 20 minutes  Subjective:   Richard HillockChristopher L Teaney is a 30 y.o. male patient with a history of schizoaffective disorder was admitted with auditory hallucinations and trying to cut his wrists with a dull knife.   Richard Hillockhristopher L Gonzales, 30 y.o., male patient seen via tele health by this provider, consulted with Dr. Lucianne MussKumar  and chart reviewed on 01/14/21.  On evaluation Richard HillockChristopher L Karney reports, "I was hearing voices that were telling me to cut my wrist".  HPI:    During evaluation Richard HillockChristopher L Olmeda is in sitting position in no acute distress.  He is alert, oriented x 4, calm and cooperative. His mood is euthymic with congruent affect.  "States I am feeling much better and ready to go home". He does not appear to be responding to internal/external stimuli or delusional thoughts.  Patient denies suicidal/self-harm/homicidal ideation, psychosis, and paranoia.  Patient answered question appropriately.  Reports that he sees an ACT team and it was time for his medications and his injectable. States at the time the  ACT team was at this house, he was hearing voices that were telling him to cut himself. States he was brought to the emergency room. States he doesn't remember hitting the glass in the emergency room, states "I was kind of out of it".   Patient reports that he is no longer hearing voices and is not suicidal. States he has an appointment with his ACT team today at 11:30 am.  Patient contracts to safety, states if  he goes home today he is confident he can keep himself and others safe.   No firearms/weopons in home.   Lives alone. States his parents are 15 minutes away and check on him regularly.    Past Psychiatric History: Schizoaffective disorder, bipolar type, Suicidal Ideation  Risk to Self:  Pt denies  Risk to Others:  Pt denies Prior Inpatient Therapy:  [place] Prior Outpatient Therapy:  yes  Past Medical History:  Past Medical History:  Diagnosis Date  . Bipolar disorder (HCC)   . Depression   . Hypercholesterolemia   . Hypertension   . Hypothyroidism   . Schizophrenia [facility](HCC)     Past Surgical History:  Procedure Laterality Date  . CARDIAC SURGERY     for Epstein's abnormality at 30yo  . TONSILLECTOMY     Family History: History reviewed. No pertinent family history. Family Psychiatric  History: unkown Social History:  Social History   Substance and Sexual Activity  Alcohol Use No   Comment: occ.     Social History   Substance and Sexual Activity  Drug Use Yes  . Frequency: 2.0 times per week  . Types: Marijuana   Comment: occ- last used 09/19/2020    Social History   Socioeconomic History  . Marital status: Single    Spouse name: Not on file  . Number of children: Not on file  . Years of education: Not on file  . Highest education level: Not on file  Occupational History  . Not on file  Tobacco Use  . Smoking status: Current Every Day Smoker  Packs/day: 1.00    Types: Cigarettes  . Smokeless tobacco: Never Used  Vaping Use  . Vaping Use: Never used  Substance and Sexual Activity  . Alcohol use: No    Comment: occ.  . Drug use: Yes    Frequency: 2.0 times per week    Types: Marijuana    Comment: occ- last used 09/19/2020  . Sexual activity: Never  Other Topics Concern  . Not on file  Social History Narrative  . Not on file   Social Determinants of Health   Financial Resource Strain: Not on file  Food Insecurity: Not on file  Transportation  Needs: Not on file  Physical Activity: Not on file  Stress: Not on file  Social Connections: Not on file   Additional Social History:    Allergies:  No Known Allergies  Labs:  Results for orders placed or performed during the hospital encounter of 01/12/21 (from the past 48 hour(s))  Comprehensive metabolic panel     Status: Abnormal   Collection Time: 01/12/21  2:53 PM  Result Value Ref Range   Sodium 135 135 - 145 mmol/L   Potassium 3.8 3.5 - 5.1 mmol/L   Chloride 102 98 - 111 mmol/L   CO2 23 22 - 32 mmol/L   Glucose, Bld 113 (H) 70 - 99 mg/dL    Comment: Glucose reference range applies only to samples taken after fasting for at least 8 hours.   BUN 10 6 - 20 mg/dL   Creatinine, Ser 4.09 0.61 - 1.24 mg/dL   Calcium 9.4 8.9 - 81.1 mg/dL   Total Protein 7.4 6.5 - 8.1 g/dL   Albumin 4.3 3.5 - 5.0 g/dL   AST 27 15 - 41 U/L   ALT 65 (H) 0 - 44 U/L   Alkaline Phosphatase 105 38 - 126 U/L   Total Bilirubin 0.7 0.3 - 1.2 mg/dL   GFR, Estimated >91 >47 mL/min    Comment: (NOTE) Calculated using the CKD-EPI Creatinine Equation (2021)    Anion gap 10 5 - 15    Comment: Performed at [facility], [address] [place], [place] [address]  CBC with Diff     Status: Abnormal   Collection Time: 01/12/21  2:53 PM  Result Value Ref Range   WBC 14.4 (H) 4.0 - 10.5 K/uL   RBC 5.25 4.22 - 5.81 MIL/uL   Hemoglobin 16.0 13.0 - 17.0 g/dL   HCT 21.3 08.6 - 57.8 %   MCV 89.9 80.0 - 100.0 fL   MCH 30.5 26.0 - 34.0 pg   MCHC 33.9 30.0 - 36.0 g/dL   RDW 46.9 62.9 - 52.8 %   Platelets 241 150 - 400 K/uL   nRBC 0.0 0.0 - 0.2 %   Neutrophils Relative % 81 %   Neutro Abs 11.7 (H) 1.7 - 7.7 K/uL   Lymphocytes Relative 10 %   Lymphs Abs 1.5 0.7 - 4.0 K/uL   Monocytes Relative 7 %   Monocytes Absolute 1.0 0.1 - 1.0 K/uL   Eosinophils Relative 1 %   Eosinophils Absolute 0.1 0.0 - 0.5 K/uL   Basophils Relative 1 %   Basophils Absolute 0.1 0.0 - 0.1 K/uL   Immature Granulocytes 0 %   Abs  Immature Granulocytes 0.06 0.00 - 0.07 K/uL    Comment: Performed at [facility], [address] [place], [place] [address]  Ethanol     Status: None   Collection Time: 01/12/21  2:55 PM  Result Value Ref  Range   Alcohol, Ethyl (B) <10 <10 mg/dL    Comment: (NOTE) Lowest detectable limit for serum alcohol is 10 mg/dL.  For medical purposes only. Performed at [facility], [address] [place], [place] [address]   Urine rapid drug screen (hosp performed)     Status: Abnormal   Collection Time: 01/12/21  2:59 PM  Result Value Ref Range   Opiates NONE DETECTED NONE DETECTED   Cocaine NONE DETECTED NONE DETECTED   Benzodiazepines NONE DETECTED NONE DETECTED   Amphetamines NONE DETECTED NONE DETECTED   Tetrahydrocannabinol POSITIVE (A) NONE DETECTED   Barbiturates NONE DETECTED NONE DETECTED    Comment: (NOTE) DRUG SCREEN FOR MEDICAL PURPOSES ONLY.  IF CONFIRMATION IS NEEDED FOR ANY PURPOSE, NOTIFY LAB WITHIN 5 DAYS.  LOWEST DETECTABLE LIMITS FOR URINE DRUG SCREEN Drug Class                     Cutoff (ng/mL) Amphetamine and metabolites    1000 Barbiturate and metabolites    200 Benzodiazepine                 200 Tricyclics and metabolites     300 Opiates and metabolites        300 Cocaine and metabolites        300 THC                            50 Performed at [facility], [address], [place], [place] [address]     Medications:  Current Facility-Administered Medications  Medication Dose Route Frequency Provider Last Rate Last Admin  . benztropine (COGENTIN) tablet 1 mg  1 mg Oral TID Mancel Bale, MD   1 mg at 01/14/21 0100  . divalproex (DEPAKOTE ER) 24 hr tablet 500 mg  500 mg Oral TID Mancel Bale, MD   500 mg at 01/14/21 0100  . FLUoxetine (PROZAC) capsule 60 mg  60 mg Oral q morning Mancel Bale, MD      . levothyroxine (SYNTHROID) tablet 100 mcg  100 mcg Oral QAC breakfast Mancel Bale, MD   100 mcg at 01/14/21 0645  . metoprolol tartrate (LOPRESSOR)  tablet 25 mg  25 mg Oral Daily Mancel Bale, MD       Current Outpatient Medications  Medication Sig Dispense Refill  . haloperidol decanoate (HALDOL DECANOATE) 100 MG/ML injection Inject 2 mLs (200 mg total) into the muscle every 28 (twenty-eight) days. 1 mL 0  . levothyroxine (SYNTHROID) 100 MCG tablet Take by mouth.    . metoprolol tartrate (LOPRESSOR) 50 MG tablet Take by mouth.    . rosuvastatin (CRESTOR) 10 MG tablet Take by mouth.    Marland Kitchen albuterol (PROVENTIL HFA;VENTOLIN HFA) 108 (90 BASE) MCG/ACT inhaler Inhale 2 puffs into the lungs every 6 (six) hours as needed for wheezing or shortness of breath. (Patient not taking: Reported on 12/01/2020) 1 Inhaler 0  . benztropine (COGENTIN) 1 MG tablet Take 1 tablet (1 mg total) by mouth 3 (three) times daily. 90 tablet 0  . cyclobenzaprine (FLEXERIL) 10 MG tablet Take 10 mg by mouth at bedtime. (Patient not taking: Reported on 12/01/2020)    . divalproex (DEPAKOTE ER) 500 MG 24 hr tablet Take 500 mg by mouth 3 (three) times daily. (Patient not taking: No sig reported)    . divalproex (DEPAKOTE) 500 MG DR tablet Take 4 tablets (2,000 mg total) by mouth at bedtime. (Patient taking differently: Take 1,000  mg by mouth 2 (two) times daily.) 120 tablet 0  . fenofibrate 160 MG tablet Take 1 tablet (160 mg total) by mouth every morning. 30 tablet 0  . FLUoxetine (PROZAC) 20 MG capsule Take 3 capsules (60 mg total) by mouth every morning. (Patient not taking: No sig reported) 90 capsule 0  . haloperidol (HALDOL) 10 MG tablet Take 10 mg by mouth every evening.    . haloperidol (HALDOL) 5 MG tablet Take 1 tablet (5 mg total) by mouth 2 (two) times daily. (Patient not taking: Reported on 12/01/2020) 60 tablet 0  . hydrOXYzine (ATARAX/VISTARIL) 25 MG tablet Take 1 tablet (25 mg total) by mouth 3 (three) times daily. (Patient not taking: No sig reported) 90 tablet 0  . lamoTRIgine (LAMICTAL) 100 MG tablet Take 100 mg by mouth daily. (Patient not taking: No sig  reported)    . levothyroxine (SYNTHROID, LEVOTHROID) 100 MCG tablet Take 1 tablet (100 mcg total) by mouth daily before breakfast. (Patient taking differently: Take 150 mcg by mouth daily before breakfast.) 30 tablet 0  . LORazepam (ATIVAN) 0.5 MG tablet Take 1 tablet (0.5 mg total) by mouth 3 (three) times daily. (Patient not taking: No sig reported) 90 tablet 0  . metoprolol tartrate (LOPRESSOR) 25 MG tablet Take 1 tablet (25 mg total) by mouth daily. (Patient taking differently: Take 50 mg by mouth 2 (two) times daily.) 60 tablet 0  . metoprolol tartrate (LOPRESSOR) 50 MG tablet Take 50 mg by mouth 2 (two) times daily.    . naproxen (NAPROSYN) 500 MG tablet Take 500 mg by mouth 2 (two) times daily.    . QUEtiapine (SEROQUEL) 100 MG tablet Take 100 mg by mouth See admin instructions. 1 tablet in the morning and 3 tablets at night    . rosuvastatin (CRESTOR) 10 MG tablet Take 1 tablet by mouth daily.    . traZODone (DESYREL) 100 MG tablet     . traZODone (DESYREL) 150 MG tablet Take 1 tablet (150 mg total) by mouth at bedtime. 30 tablet 0    Musculoskeletal: Strength & Muscle Tone: within normal limits Gait & Station: normal Patient leans: N/A  Psychiatric Specialty Exam: Physical Exam Vitals reviewed.  HENT:     Head: Normocephalic.     Right Ear: Tympanic membrane normal.     Left Ear: Tympanic membrane normal.     Nose: Nose normal.     Mouth/Throat:     Pharynx: Oropharynx is clear.  Eyes:     Conjunctiva/sclera: Conjunctivae normal.  Cardiovascular:     Rate and Rhythm: Normal rate.  Pulmonary:     Effort: Pulmonary effort is normal.  Abdominal:     Tenderness: There is no guarding.  Musculoskeletal:        General: Normal range of motion.     Cervical back: Normal range of motion.  Skin:    General: Skin is dry.  Neurological:     Mental Status: He is alert and oriented to person, place, and time.  Psychiatric:        Attention and Perception: Attention normal.         Mood and Affect: Mood normal.        Speech: Speech normal.        Behavior: Behavior is not agitated or aggressive. Behavior is cooperative.        Thought Content: Thought content normal. Thought content is not paranoid or delusional. Thought content does not include homicidal or suicidal ideation. Thought content  does not include homicidal or suicidal plan.        Cognition and Memory: Cognition normal.        Judgment: Judgment is impulsive.     Review of Systems  Constitutional: Negative.   HENT: Negative.   Eyes: Negative.   Respiratory: Negative.   Cardiovascular: Negative.   Gastrointestinal: Negative.   Endocrine: Negative.   Genitourinary: Negative.   Musculoskeletal: Negative.   Allergic/Immunologic: Negative.   Neurological: Negative.   Hematological: Negative.   Psychiatric/Behavioral: Negative.     Blood pressure (!) 137/97, pulse 77, temperature 98.2 F (36.8 C), temperature source Oral, resp. rate 17, height 5\' 10"  (1.778 m), weight 127.5 kg, SpO2 100 %.Body mass index is 40.32 kg/m.  General Appearance: Fairly Groomed  Eye Contact:  Good  Speech:  NA  Volume:  Normal  Mood:  Euthymic  Affect:  Congruent  Thought Process:  Coherent and Goal Directed  Orientation:  Full (Time, Place, and Person)  Thought Content:  Logical  Suicidal Thoughts:  No  Homicidal Thoughts:  No  Memory:  Immediate;   Good Recent;   Good Remote;   Good  Judgement:  Fair  Insight:  Good  Psychomotor Activity:  Normal  Concentration:  Concentration: Good and Attention Span: Good  Recall:  Good  Fund of Knowledge:  Good  Language:  Good  Akathisia:  No  Handed:  Right  AIMS (if indicated):     Assets:  Communication Skills Desire for Improvement Financial Resources/Insurance Housing Leisure Time Physical Health Resilience Social Support Transportation Vocational/Educational  ADL's:  Intact  Cognition:  WNL  Sleep:        Treatment Plan Summary: Follow up  appointment with ACT Team 01/14/2021 11:00 am  Continue all at home psychotropic medications and follow up with Provider/ACT team for medication managment.   The suicide prevention education provided includes the following:  Suicide risk factors  Suicide prevention and interventions  National Suicide Hotline telephone number  [facility] assessment telephone number  [facility] Emergency Assistance [address] and/or Residential Mobile Crisis Unit telephone number  Remove weapons (e.g., guns, rifles, knives), all items previously/currently identified as safety concern.   Remove drugs/medications (over the counter, prescriptions, illicit drugs), all items previously/currently identified as a safety concern.   Disposition: No evidence of imminent risk to self or others at present.   Patient does not meet criteria for psychiatric inpatient admission. Supportive therapy provided about ongoing stressors. Discussed crisis plan, support from social network, calling 911, coming to the Emergency Department, and calling Suicide Hotline.  This service was provided via telemedicine using a 2-way, interactive audio and video technology.  Names of all persons participating in this telemedicine service and their role in this encounter. Name: [address] Role: patient   Name: Burke Keels  Role: NP  Name:  Role:   Name:  Role:    Notified EDP Vernard Gambles of patients disposition.   Benjiman Core, NP 01/14/2021 7:31 AM

## 2021-01-14 NOTE — ED Notes (Signed)
Pt alert and oriented x 4, denies SI,HI, or active AVH. Reports that PTA to ED he was having auditory hallucinations and he got angry so he cut his L wrist. Superficial lacs to L wrist with no active bleeding noted. Awaiting re-screen with TTS at this time, pt updated on POC. Denies needs at this time, will continue to monitor. Sitter at bedside.

## 2021-01-14 NOTE — Discharge Instructions (Signed)
Follow-up with your psychiatrist.  Watch the wound for signs of infection.

## 2021-01-14 NOTE — ED Notes (Signed)
Notified [name] that there was no note from TTS last night, Marcus aware, reports he will check on it.

## 2021-01-14 NOTE — ED Notes (Signed)
Rescinded IVC Papers faxed to Magistrate. 

## 2021-08-18 ENCOUNTER — Emergency Department (HOSPITAL_COMMUNITY)
Admission: EM | Admit: 2021-08-18 | Discharge: 2021-08-19 | Disposition: E | Payer: Federal, State, Local not specified - PPO | Attending: Emergency Medicine | Admitting: Emergency Medicine

## 2021-08-18 DIAGNOSIS — I10 Essential (primary) hypertension: Secondary | ICD-10-CM | POA: Diagnosis not present

## 2021-08-18 DIAGNOSIS — F17210 Nicotine dependence, cigarettes, uncomplicated: Secondary | ICD-10-CM | POA: Insufficient documentation

## 2021-08-18 DIAGNOSIS — E039 Hypothyroidism, unspecified: Secondary | ICD-10-CM | POA: Insufficient documentation

## 2021-08-18 DIAGNOSIS — Z79899 Other long term (current) drug therapy: Secondary | ICD-10-CM | POA: Diagnosis not present

## 2021-08-18 DIAGNOSIS — I469 Cardiac arrest, cause unspecified: Secondary | ICD-10-CM | POA: Insufficient documentation

## 2021-08-18 LAB — I-STAT CHEM 8, ED
BUN: 53 mg/dL — ABNORMAL HIGH (ref 6–20)
Calcium, Ion: 0.96 mmol/L — ABNORMAL LOW (ref 1.15–1.40)
Chloride: 114 mmol/L — ABNORMAL HIGH (ref 98–111)
Creatinine, Ser: 2.7 mg/dL — ABNORMAL HIGH (ref 0.61–1.24)
Glucose, Bld: 79 mg/dL (ref 70–99)
HCT: 53 % — ABNORMAL HIGH (ref 39.0–52.0)
Hemoglobin: 18 g/dL — ABNORMAL HIGH (ref 13.0–17.0)
Potassium: 6 mmol/L — ABNORMAL HIGH (ref 3.5–5.1)
Sodium: 141 mmol/L (ref 135–145)
TCO2: 18 mmol/L — ABNORMAL LOW (ref 22–32)

## 2021-08-18 MED ORDER — EPINEPHRINE 1 MG/10ML IJ SOSY
PREFILLED_SYRINGE | INTRAMUSCULAR | Status: DC | PRN
  Administered 2021-08-18: 1 mg via INTRAVENOUS

## 2021-08-18 MED ORDER — NALOXONE HCL 2 MG/2ML IJ SOSY
PREFILLED_SYRINGE | INTRAMUSCULAR | Status: DC | PRN
  Administered 2021-08-18 (×2): 2 mg via INTRAVENOUS

## 2021-08-18 MED ORDER — SODIUM BICARBONATE 8.4 % IV SOLN
INTRAVENOUS | Status: DC | PRN
  Administered 2021-08-18: 100 meq via INTRAVENOUS

## 2021-08-19 LAB — CBG MONITORING, ED: Glucose-Capillary: 88 mg/dL (ref 70–99)

## 2021-08-19 NOTE — ED Provider Notes (Signed)
[facility] EMERGENCY DEPARTMENT Provider Note   CSN: [id number] Arrival date & time: 07/26/2021  1905     History No chief complaint on file.   [name] is a 30 y.o. male.  HPI     30 year old male with history of hypertension, hyperlipidemia, hypothyroidism, schizophrenia, bipolar, history of SI, drug abuse, congenital heart disease/Ebstein anomaly s/p repair, cardiac ablations who presents with concern for cardiac arrest.  He was last seen or heard from last night. Today, his father was trying to call him and was unable to get in touch with him.  He then went over the the hotel where he was staying and knocked on the door, then had them open the door and found him down, unresponsive, cold.  He began bystander CPR, EMS found him in asystole and continued CPR en route, for approximately 45 minutes, he received 11 epinephrine. Just prior to arrival transitioned to vfib and was given amiodarone and defibrillated. Dad saw him yesterday and he seemed to be doing well, was not ill, no coughing, vomiting.  He had been asking for money and told him he sold his phone. Had gone through a lot of money over the last few days, concerned he was using but did not seem like he was on crack yesterday.  He told him he was using marijuana. Dad concerned there may have been something else.     Past Medical History:  Diagnosis Date   Bipolar disorder (HCC)    Depression    Hypercholesterolemia    Hypertension    Hypothyroidism    Schizophrenia [facility])     Patient Active Problem List   Diagnosis Date Noted   Suicidal ideation 12/07/2020   Morbid obesity with BMI of 40.0-44.9, adult (HCC) 07/07/2020   Chronic right-sided low back pain with right-sided sciatica 11/26/2019   DDD (degenerative disc disease), lumbar 11/26/2019   Spinal stenosis of lumbar region with neurogenic claudication 11/26/2019   H/O cardiac radiofrequency ablation 10/02/2019   H/O prior ablation  treatment 10/02/2019   Hypertrophic cardiomyopathy (HCC) 10/02/2019   Chronic pain of left knee 10/18/2018   Ebstein anomaly 10/18/2018   Gastroesophageal reflux disease without esophagitis 10/18/2018   Chest pain 11/07/2016   Palpitations 11/07/2016   Tobacco use disorder 07/13/2015   Hypothyroidism 07/13/2015   Dyslipidemia 07/13/2015   HTN (hypertension) 07/13/2015   Hyperlipidemia 07/13/2015   Nicotine dependence, uncomplicated 07/13/2015   Cannabis use disorder, moderate, dependence (HCC) 11/17/2014   Schizophrenia, paranoid (HCC) 03/01/2014   Schizoaffective disorder, bipolar type (HCC) 12/29/2012    Past Surgical History:  Procedure Laterality Date   CARDIAC SURGERY     for Epstein's abnormality at 30yo   TONSILLECTOMY         No family history on file.  Social History   Tobacco Use   Smoking status: Every Day    Packs/day: 1.00    Types: Cigarettes   Smokeless tobacco: Never  Vaping Use   Vaping Use: Never used  Substance Use Topics   Alcohol use: No    Comment: occ.   Drug use: Yes    Frequency: 2.0 times per week    Types: Marijuana    Comment: occ- last used 09/19/2020    Home Medications Prior to Admission medications   Medication Sig Start Date End Date Taking? Authorizing Provider  albuterol (PROVENTIL HFA;VENTOLIN HFA) 108 (90 BASE) MCG/ACT inhaler Inhale 2 puffs into the lungs every 6 (six) hours as needed for wheezing  or shortness of breath. Patient not taking: Reported on 12/01/2020 07/16/15   Pucilowska, Ellin Goodie, MD  benztropine (COGENTIN) 1 MG tablet Take 1 tablet (1 mg total) by mouth 3 (three) times daily. 07/16/15   Pucilowska, Jolanta B, MD  cyclobenzaprine (FLEXERIL) 10 MG tablet Take 10 mg by mouth at bedtime. Patient not taking: Reported on 12/01/2020 08/31/20   [provider]  divalproex (DEPAKOTE ER) 500 MG 24 hr tablet Take 500 mg by mouth 3 (three) times daily. Patient not taking: No sig reported 09/23/20   [provider]  divalproex (DEPAKOTE) 500 MG DR tablet Take 4 tablets (2,000 mg total) by mouth at bedtime. Patient taking differently: Take 1,000 mg by mouth 2 (two) times daily. 07/16/15   Pucilowska, Braulio Conte B, MD  fenofibrate 160 MG tablet Take 1 tablet (160 mg total) by mouth every morning. 07/16/15   Pucilowska, Braulio Conte B, MD  FLUoxetine (PROZAC) 20 MG capsule Take 3 capsules (60 mg total) by mouth every morning. Patient not taking: No sig reported 07/16/15   Pucilowska, Jolanta B, MD  haloperidol (HALDOL) 10 MG tablet Take 10 mg by mouth every evening. 09/29/20   [provider]  haloperidol (HALDOL) 5 MG tablet Take 1 tablet (5 mg total) by mouth 2 (two) times daily. Patient not taking: Reported on 12/01/2020 07/16/15   Pucilowska, Braulio Conte B, MD  haloperidol decanoate (HALDOL DECANOATE) 100 MG/ML injection Inject 2 mLs (200 mg total) into the muscle every 28 (twenty-eight) days. 07/16/15   Pucilowska, Braulio Conte B, MD  hydrOXYzine (ATARAX/VISTARIL) 25 MG tablet Take 1 tablet (25 mg total) by mouth 3 (three) times daily. Patient not taking: No sig reported 07/16/15   Pucilowska, Jolanta B, MD  lamoTRIgine (LAMICTAL) 100 MG tablet Take 100 mg by mouth daily. Patient not taking: No sig reported 09/29/20   [provider]  levothyroxine (SYNTHROID) 100 MCG tablet Take by mouth. 05/28/19   [provider]  levothyroxine (SYNTHROID, LEVOTHROID) 100 MCG tablet Take 1 tablet (100 mcg total) by mouth daily before breakfast. Patient taking differently: Take 150 mcg by mouth daily before breakfast. 07/16/15   Pucilowska, Jolanta B, MD  LORazepam (ATIVAN) 0.5 MG tablet Take 1 tablet (0.5 mg total) by mouth 3 (three) times daily. Patient not taking: No sig reported 07/16/15   Pucilowska, Jolanta B, MD  metoprolol tartrate (LOPRESSOR) 25 MG tablet Take 1 tablet (25 mg total) by mouth daily. Patient taking differently: Take 50 mg by mouth 2 (two) times daily. 07/16/15   Pucilowska,  Braulio Conte B, MD  metoprolol tartrate (LOPRESSOR) 50 MG tablet Take 50 mg by mouth 2 (two) times daily. 09/29/20   [provider]  metoprolol tartrate (LOPRESSOR) 50 MG tablet Take by mouth. 01/04/21   [provider]  naproxen (NAPROSYN) 500 MG tablet Take 500 mg by mouth 2 (two) times daily. 08/31/20   [provider]  QUEtiapine (SEROQUEL) 100 MG tablet Take 100 mg by mouth See admin instructions. 1 tablet in the morning and 3 tablets at night 08/19/20   [provider]  rosuvastatin (CRESTOR) 10 MG tablet Take 1 tablet by mouth daily. 11/06/19   [provider]  rosuvastatin (CRESTOR) 10 MG tablet Take by mouth. 01/04/21   [provider]  traZODone (DESYREL) 100 MG tablet  09/02/20   [provider]  traZODone (DESYREL) 150 MG tablet Take 1 tablet (150 mg total) by mouth at bedtime. 07/16/15   Shari Prows, MD    Allergies  Patient has no known allergies.  Review of Systems   Review of Systems  Unable to perform ROS: Patient unresponsive   Physical Exam Updated Vital Signs Pulse (!) 0   Resp (!) 0   Ht 5\' 10"  (1.778 m)   Wt 127 kg   SpO2 (!) 50% Comment: BVM-King  BMI 40.17 kg/m   Physical Exam Vitals and nursing note reviewed.  Constitutional:      General: He is not in acute distress.    Appearance: He is ill-appearing. He is not toxic-appearing or diaphoretic.  HENT:     Head: Normocephalic and atraumatic.  Eyes:     Conjunctiva/sclera: Conjunctivae normal.     Comments: Right pupil 67mm, nonreactve, left 33mm, nonreactive  Cardiovascular:     Comments: CPR in progress Pulmonary:     Comments: King airway in place Musculoskeletal:        General: No deformity or signs of injury.     Cervical back: No rigidity.  Skin:    General: Skin is dry.     Coloration: Skin is not jaundiced or pale.    ED Results / Procedures / Treatments   Labs (all labs ordered are listed, but only abnormal results are  displayed) Labs Reviewed  I-STAT CHEM 8, ED - Abnormal; Notable for the following components:      Result Value   Potassium 6.0 (*)    Chloride 114 (*)    BUN 53 (*)    Creatinine, Ser 2.70 (*)    Calcium, Ion 0.96 (*)    TCO2 18 (*)    Hemoglobin 18.0 (*)    HCT 53.0 (*)    All other components within normal limits  CBG MONITORING, ED    EKG None  Radiology No results found.  Procedures Procedures   Medications Ordered in ED Medications - No data to display   ED Course  I have reviewed the triage vital signs and the nursing notes.  Pertinent labs & imaging results that were available during my care of the patient were reviewed by me and considered in my medical decision making (see chart for details).    MDM Rules/Calculators/A&P                           30 year old male with history of hypertension, hyperlipidemia, hypothyroidism, schizophrenia, bipolar, history of SI, drug abuse, congenital heart disease/Ebstein anomaly s/p repair, cardiac ablations who presents with concern for cardiac arrest.  He was found with unknown downtime by father after last talking to him last night.  Asystole for much of transport, did have transition to vfib. CPR continued in the ED, given bicarb, narcan, epinephrine.  King airway in place with bilateral breath sounds-discussed with team that after one hour of CPR without return of pulse and with unknown downtime that unfortunately, further resuscitation would be futile.  Discontinued resuscitation, time of death 7:17PM.  Discussed with ME 26 who will take the case. Discussed with father and chaplain.   Final Clinical Impression(s) / ED Diagnoses Final diagnoses:  Cardiac arrest [facility])    Rx / DC Orders ED Discharge Orders     None        [facility], MD 08/19/21 1210

## 2021-08-19 NOTE — ED Notes (Signed)
Pt family member given patient placement number and card.

## 2021-08-19 NOTE — Code Documentation (Signed)
200J Defib

## 2021-08-19 NOTE — ED Triage Notes (Signed)
Arrives via [facility]. Last seen yesterday by family. Could be reached today. CPR done for 45 min PTA 11 EPI given, king airway in place, lucas in use. HX congenital heart defect and IV drug use.

## 2021-08-19 NOTE — ED Notes (Signed)
Family at bedside with MD

## 2021-08-19 NOTE — ED Notes (Signed)
Patient transported to the morgue.

## 2021-08-19 NOTE — Code Documentation (Signed)
200J defib

## 2021-08-19 NOTE — ED Notes (Signed)
Chaplain at bedside with family 

## 2021-08-19 NOTE — Code Documentation (Signed)
Patient time of death occurred at 42 by Dalene Seltzer MD.

## 2021-08-19 NOTE — Consult Note (Signed)
EMT who was at scene w/ Pt's father (who found pt down) believed father had followed them to [facility], but could not find him in waiting room after pt passed.. Doctor and I gave Security our nos. to page when father arrives. Standing by.  Rev. Donnel Saxon Chaplain

## 2021-08-19 NOTE — Consult Note (Signed)
Sat with father, a person of strong [place] faith, mourning son. When I entered the room, he first said he was "good" (as MD told me on my way in he'd already told her)--that he didn't need anything. For he knows, with Renae Fickle, that all things work together for good for them who know the Lord. But later he added he really didn't see how that was working out here yet: He'd never lost a child before, it was not in the natural order of things--but then his ways are not our ways....  Pt's dad had been EMT, found son down, felt him cold-- though he himself did CPR after calling 911. He said he didn't really believe in premonitions, but he'd had a bad feeling earlier today after he finished [facility] and was trying to call his son to tell him he'd bring him the cigarettes his son wanted. But when he repeatedly got no answer and went to where his son was staying, opened the door and touched his son on the floor, he immediately knew his son was gone. He can only believe he's happy with the Shaune Pollack now.  He reminisced about his son and their lives together,saying his son was a good boy, though ,like his mother who died last yr, had a mental illness. His present wife would be all torn up, though, and he didn't know how he'd tell his daughters. But he trusts his son's with the Lord.   He greatly appreciated both the prayer shawl I brought him for his wife (he said she crochets them herself for churches to give hospitals, and it will mean a lot to her) and, b/c he can't see as well as he used to, the larger-print New Testament/ Psalms copy I gave him.   Sensing he wanted to resume his last conversation with his son, I told him the nurse would not hurry him, and left him to continue saying good-bye.  Rev. Donnel Saxon Chaplain

## 2021-08-19 DEATH — deceased

## 2022-09-18 IMAGING — DX DG CHEST 1V PORT
1 series · 1 of 1 positions shown · non-contrast
Comparison: Radiograph 03/02/2012

CLINICAL DATA: Chest pain with palpitations.

EXAM:
PORTABLE CHEST 1 VIEW

[chest ap]
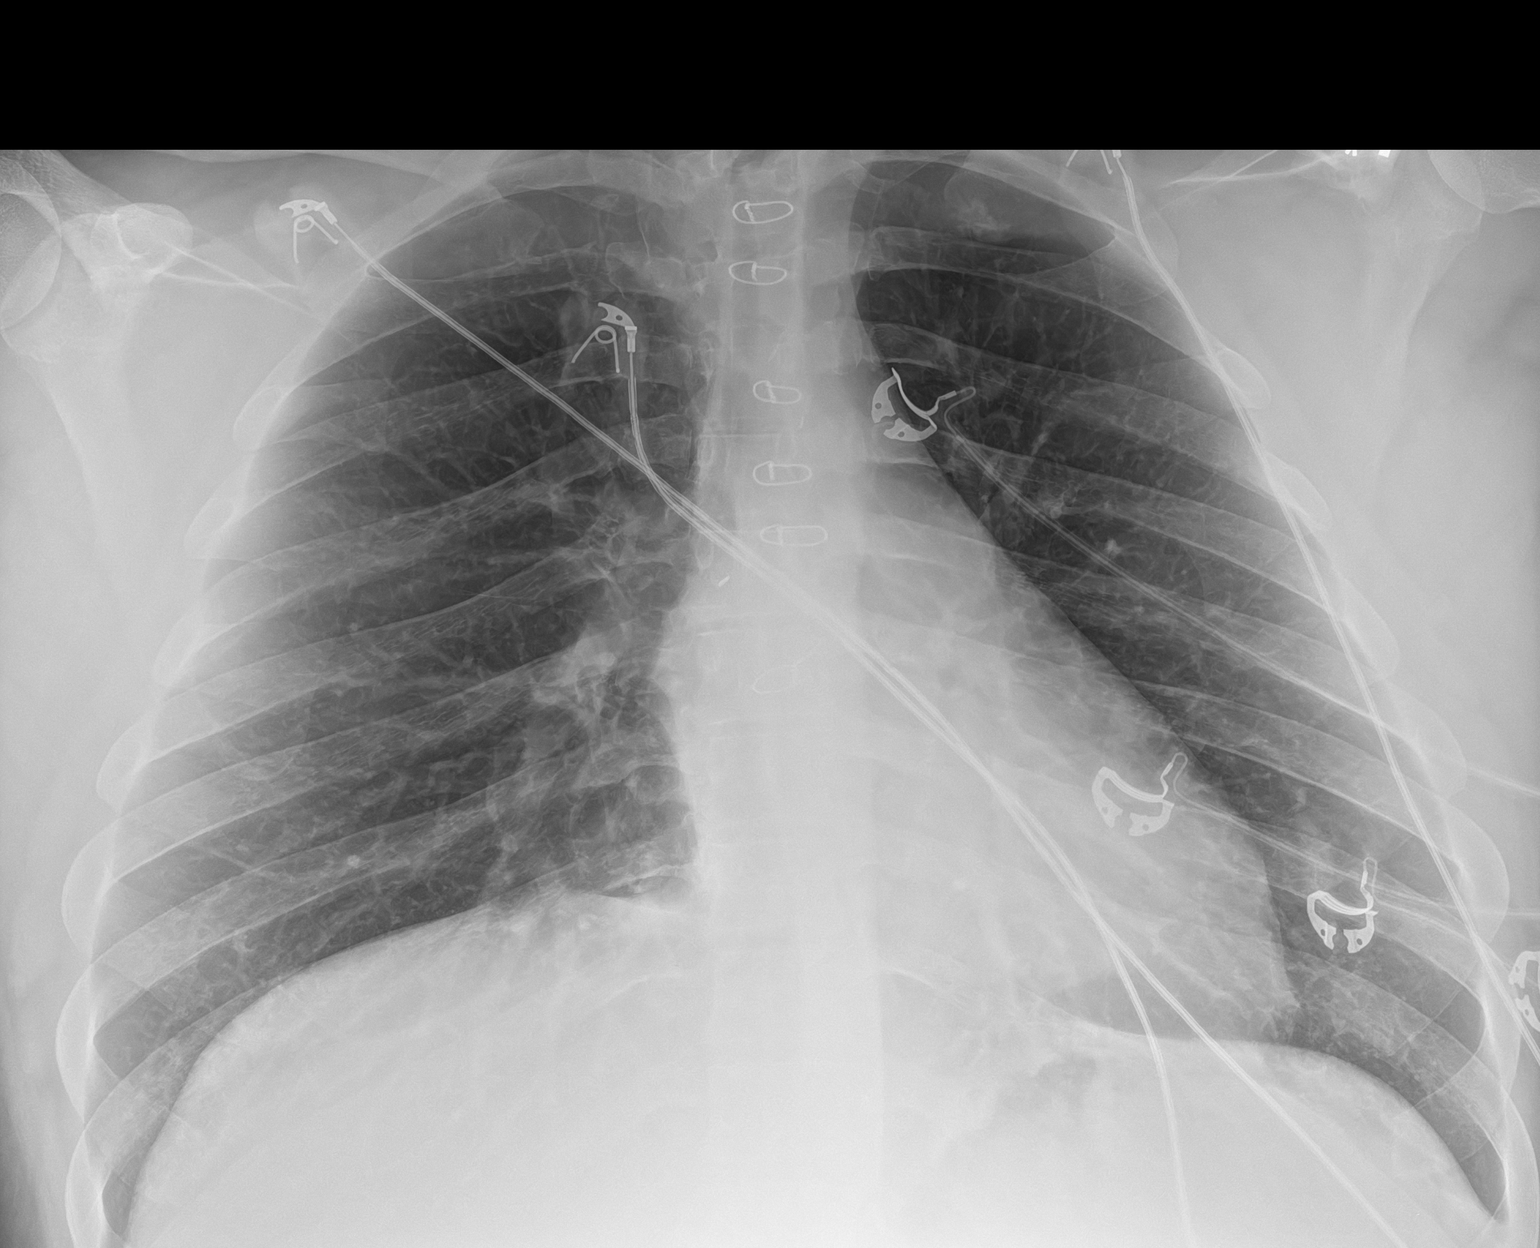

[1 of 1 positions shown; findings below may reference images not displayed]

FINDINGS: Remote median sternotomy. The heart is normal in size. Unchanged
mediastinal contours. No pulmonary edema. No focal airspace disease,
pleural effusion or pneumothorax. No acute osseous abnormalities are
seen.
IMPRESSION: No acute chest finding. Remote median sternotomy.
